# Patient Record
Sex: Male | Born: 1935 | Race: White | Hispanic: No | Marital: Married | Smoking: Former smoker
Health system: Southern US, Community
[De-identification: ages and names within clinical notes are randomized; demographics above are authoritative.]

## PROBLEM LIST (undated history)

## (undated) DIAGNOSIS — E876 Hypokalemia: Secondary | ICD-10-CM

## (undated) DIAGNOSIS — I10 Essential (primary) hypertension: Secondary | ICD-10-CM

## (undated) DIAGNOSIS — J690 Pneumonitis due to inhalation of food and vomit: Secondary | ICD-10-CM

## (undated) DIAGNOSIS — E785 Hyperlipidemia, unspecified: Secondary | ICD-10-CM

## (undated) DIAGNOSIS — D638 Anemia in other chronic diseases classified elsewhere: Secondary | ICD-10-CM

## (undated) DIAGNOSIS — C3490 Malignant neoplasm of unspecified part of unspecified bronchus or lung: Secondary | ICD-10-CM

## (undated) DIAGNOSIS — L080 Pyoderma: Secondary | ICD-10-CM

## (undated) DIAGNOSIS — R000 Tachycardia, unspecified: Secondary | ICD-10-CM

## (undated) DIAGNOSIS — R634 Abnormal weight loss: Secondary | ICD-10-CM

## (undated) DIAGNOSIS — B370 Candidal stomatitis: Secondary | ICD-10-CM

## (undated) DIAGNOSIS — L258 Unspecified contact dermatitis due to other agents: Secondary | ICD-10-CM

## (undated) DIAGNOSIS — D61818 Other pancytopenia: Secondary | ICD-10-CM

## (undated) DIAGNOSIS — G4700 Insomnia, unspecified: Secondary | ICD-10-CM

## (undated) DIAGNOSIS — R42 Dizziness and giddiness: Secondary | ICD-10-CM

## (undated) DIAGNOSIS — K208 Other esophagitis: Secondary | ICD-10-CM

## (undated) HISTORY — DX: Other esophagitis: K20.8

## (undated) HISTORY — DX: Candidal stomatitis: B37.0

## (undated) HISTORY — DX: Malignant neoplasm of unspecified part of unspecified bronchus or lung: C34.90

## (undated) HISTORY — DX: Unspecified contact dermatitis due to other agents: L25.8

## (undated) HISTORY — DX: Tachycardia, unspecified: R00.0

## (undated) HISTORY — DX: Other pancytopenia: D61.818

## (undated) HISTORY — DX: Anemia in other chronic diseases classified elsewhere: D63.8

## (undated) HISTORY — DX: Hyperlipidemia, unspecified: E78.5

## (undated) HISTORY — DX: Abnormal weight loss: R63.4

## (undated) HISTORY — DX: Pneumonitis due to inhalation of food and vomit: J69.0

## (undated) HISTORY — DX: Insomnia, unspecified: G47.00

## (undated) HISTORY — DX: Hypokalemia: E87.6

## (undated) HISTORY — PX: TONSILLECTOMY: SHX5217

## (undated) HISTORY — DX: Pyoderma: L08.0

## (undated) HISTORY — DX: Essential (primary) hypertension: I10

## (undated) HISTORY — PX: TONSILLECTOMY: SUR1361

---

## 2003-05-20 ENCOUNTER — Emergency Department (HOSPITAL_COMMUNITY): Admission: AD | Admit: 2003-05-20 | Discharge: 2003-05-20 | Payer: Self-pay | Admitting: Family Medicine

## 2005-12-13 ENCOUNTER — Emergency Department (HOSPITAL_COMMUNITY): Admission: EM | Admit: 2005-12-13 | Discharge: 2005-12-13 | Payer: Self-pay | Admitting: Family Medicine

## 2008-02-28 DIAGNOSIS — R42 Dizziness and giddiness: Secondary | ICD-10-CM

## 2008-02-28 HISTORY — DX: Dizziness and giddiness: R42

## 2008-08-14 ENCOUNTER — Inpatient Hospital Stay (HOSPITAL_COMMUNITY): Admission: EM | Admit: 2008-08-14 | Discharge: 2008-08-18 | Payer: Self-pay | Admitting: Emergency Medicine

## 2008-10-27 ENCOUNTER — Emergency Department (HOSPITAL_COMMUNITY): Admission: EM | Admit: 2008-10-27 | Discharge: 2008-10-27 | Payer: Self-pay | Admitting: Emergency Medicine

## 2010-06-04 LAB — COMPREHENSIVE METABOLIC PANEL
Albumin: 4.2 g/dL (ref 3.5–5.2)
Alkaline Phosphatase: 87 U/L (ref 39–117)
BUN: 16 mg/dL (ref 6–23)
Calcium: 9.3 mg/dL (ref 8.4–10.5)
Potassium: 4.4 mEq/L (ref 3.5–5.1)
Sodium: 138 mEq/L (ref 135–145)
Total Protein: 7.1 g/dL (ref 6.0–8.3)

## 2010-06-04 LAB — URINALYSIS, ROUTINE W REFLEX MICROSCOPIC
Leukocytes, UA: NEGATIVE
Protein, ur: 30 mg/dL — AB
Urobilinogen, UA: 0.2 mg/dL (ref 0.0–1.0)

## 2010-06-04 LAB — DIFFERENTIAL
Basophils Relative: 1 % (ref 0–1)
Lymphs Abs: 1 10*3/uL (ref 0.7–4.0)
Monocytes Absolute: 0.6 10*3/uL (ref 0.1–1.0)
Monocytes Relative: 6 % (ref 3–12)
Neutro Abs: 8.3 10*3/uL — ABNORMAL HIGH (ref 1.7–7.7)

## 2010-06-04 LAB — CBC
HCT: 38.6 % — ABNORMAL LOW (ref 39.0–52.0)
Platelets: 198 10*3/uL (ref 150–400)
RDW: 15 % (ref 11.5–15.5)

## 2010-06-04 LAB — URINE MICROSCOPIC-ADD ON

## 2010-06-06 LAB — BASIC METABOLIC PANEL
BUN: 22 mg/dL (ref 6–23)
BUN: 8 mg/dL (ref 6–23)
CO2: 27 mEq/L (ref 19–32)
Calcium: 8.2 mg/dL — ABNORMAL LOW (ref 8.4–10.5)
Calcium: 8.5 mg/dL (ref 8.4–10.5)
Chloride: 99 mEq/L (ref 96–112)
Creatinine, Ser: 1.18 mg/dL (ref 0.4–1.5)
Creatinine, Ser: 1.22 mg/dL (ref 0.4–1.5)
GFR calc Af Amer: >60 mL/min (ref >60)
GFR calc non Af Amer: >60 mL/min (ref >60)
Glucose, Bld: 136 mg/dL — ABNORMAL HIGH (ref 70–99)

## 2010-06-06 LAB — URINALYSIS, ROUTINE W REFLEX MICROSCOPIC
Glucose, UA: NEGATIVE mg/dL
Ketones, ur: NEGATIVE mg/dL
Nitrite: POSITIVE — AB
pH: 6.5 (ref 5.0–8.0)

## 2010-06-06 LAB — COMPREHENSIVE METABOLIC PANEL
ALT: 22 U/L (ref 0–53)
AST: 26 U/L (ref 0–37)
Albumin: 2.8 g/dL — ABNORMAL LOW (ref 3.5–5.2)
Alkaline Phosphatase: 66 U/L (ref 39–117)
BUN: 18 mg/dL (ref 6–23)
BUN: 21 mg/dL (ref 6–23)
CO2: 26 mEq/L (ref 19–32)
CO2: 28 mEq/L (ref 19–32)
Calcium: 8.1 mg/dL — ABNORMAL LOW (ref 8.4–10.5)
Calcium: 8.8 mg/dL (ref 8.4–10.5)
Chloride: 101 mEq/L (ref 96–112)
Chloride: 105 mEq/L (ref 96–112)
Creatinine, Ser: 1.25 mg/dL (ref 0.4–1.5)
Creatinine, Ser: 1.26 mg/dL (ref 0.4–1.5)
GFR calc non Af Amer: 56 mL/min — ABNORMAL LOW (ref >60)
GFR calc non Af Amer: >60 mL/min (ref >60)
Glucose, Bld: 102 mg/dL — ABNORMAL HIGH (ref 70–99)
Glucose, Bld: 108 mg/dL — ABNORMAL HIGH (ref 70–99)
Glucose, Bld: 169 mg/dL — ABNORMAL HIGH (ref 70–99)
Potassium: 4.2 mEq/L (ref 3.5–5.1)
Sodium: 134 mEq/L — ABNORMAL LOW (ref 135–145)
Sodium: 137 mEq/L (ref 135–145)
Total Bilirubin: 0.7 mg/dL (ref 0.3–1.2)
Total Bilirubin: 1.2 mg/dL (ref 0.3–1.2)
Total Protein: 6.7 g/dL (ref 6.0–8.3)

## 2010-06-06 LAB — CBC
HCT: 36.1 % — ABNORMAL LOW (ref 39.0–52.0)
HCT: 38.8 % — ABNORMAL LOW (ref 39.0–52.0)
Hemoglobin: 11.7 g/dL — ABNORMAL LOW (ref 13.0–17.0)
Hemoglobin: 12.3 g/dL — ABNORMAL LOW (ref 13.0–17.0)
Hemoglobin: 13.4 g/dL (ref 13.0–17.0)
MCHC: 34 g/dL (ref 30.0–36.0)
MCHC: 34.3 g/dL (ref 30.0–36.0)
MCHC: 34.4 g/dL (ref 30.0–36.0)
MCV: 84.8 fL (ref 78.0–100.0)
MCV: 85.8 fL (ref 78.0–100.0)
MCV: 86.1 fL (ref 78.0–100.0)
Platelets: 110 10*3/uL — ABNORMAL LOW (ref 150–400)
Platelets: 71 10*3/uL — ABNORMAL LOW (ref 150–400)
RBC: 4.01 MIL/uL — ABNORMAL LOW (ref 4.22–5.81)
RBC: 4.1 MIL/uL — ABNORMAL LOW (ref 4.22–5.81)
RBC: 4.22 MIL/uL (ref 4.22–5.81)
RDW: 13.6 % (ref 11.5–15.5)
RDW: 14.2 % (ref 11.5–15.5)
WBC: 7.4 10*3/uL (ref 4.0–10.5)
WBC: 8.1 10*3/uL (ref 4.0–10.5)
WBC: 8.3 10*3/uL (ref 4.0–10.5)

## 2010-06-06 LAB — URINE CULTURE: Colony Count: >=100000

## 2010-06-06 LAB — DIFFERENTIAL
Lymphocytes Relative: 3 % — ABNORMAL LOW (ref 12–46)
Lymphs Abs: 0.6 10*3/uL — ABNORMAL LOW (ref 0.7–4.0)
Monocytes Relative: 8 % (ref 3–12)
Neutro Abs: 20 10*3/uL — ABNORMAL HIGH (ref 1.7–7.7)
Neutrophils Relative %: 89 % — ABNORMAL HIGH (ref 43–77)

## 2010-06-06 LAB — LIPASE, BLOOD: Lipase: 12 U/L (ref 11–59)

## 2010-06-06 LAB — LACTIC ACID, PLASMA: Lactic Acid, Venous: 3.7 mmol/L — ABNORMAL HIGH (ref 0.5–2.2)

## 2010-06-06 LAB — CULTURE, BLOOD (ROUTINE X 2)

## 2010-06-06 LAB — CK TOTAL AND CKMB (NOT AT ARMC): Total CK: 173 U/L (ref 7–232)

## 2010-06-06 LAB — TROPONIN I: Troponin I: 0.03 ng/mL (ref 0.00–0.06)

## 2010-06-06 LAB — POCT CARDIAC MARKERS: Myoglobin, poc: >500 ng/mL (ref 12–200)

## 2010-06-06 LAB — URINE MICROSCOPIC-ADD ON

## 2010-07-12 NOTE — H&P (Signed)
NAME:  Justin Pittman, Justin Pittman NO.:  [id number]   MEDICAL RECORD NO.:  [id number]          PATIENT TYPE:  EMS   LOCATION:  ED                           FACILITY:  [facility]   PHYSICIAN:  Tera Mater. Evlyn Kanner, M.D. DATE OF BIRTH:  April 20, 1932   DATE OF ADMISSION:  08/14/2008  DATE OF DISCHARGE:                              HISTORY & PHYSICAL   HISTORY:  Justin Pittman is a 77 year old white male with a history of  hypertension, hyperlipidemia who presents for evaluation.  Yesterday, he  had some general malaise, but was doing relatively well.  He slept well  during the night, but awakened this morning with multiple episodes of  nausea and vomiting.  This was relatively projectile in nature and  happened several times.  His wife went in to find him in the bathroom.  He had laid himself down.  He had not fallen, but he was lying flat on  the floor with his head propped against the doorframe and was minimally  able to talk with her.  He has brought into the emergency room and found  to have a significant UTI and some hypotension.  He has already received  some fluids and some Cipro, and is doing a bit better.  He has had no  diarrhea.  His diet has been good recently.  He has had no chills,  sweats and no measured fever.  His general wellbeing has been good  recently.  He has had no unusual travels or exposures.  He has had no  change in his medications.  He has not been sick quite like this before.  He has a mild headache at the present.  He has a good memory of the  events.  There is no evidence of any loss of consciousness.  He is  having no cardiac symptoms.  Overall, this is fairly acute onset of  problems.   PAST MEDICAL HISTORY:  Includes hypertension and hyperlipidemia.  He has  been a prior smoker, but has not smoked in many years.  He has had BPH  in the past.  He has erectile dysfunction.   SOCIAL HISTORY:  He is married for 52 years.  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  He has not smoked since 1990s   FAMILY HISTORY:  Father died of a motor vehicle accident.  He was a  Visual merchandiser.  Mother died at 31 after hip replacement.  He has a brother that  had cancer of the bone plus pneumonia.  He has 3 children.  He is a  nondrinker.   CURRENT MEDICATIONS:  1. Lipitor 20 daily.  2. HCTZ 25 daily.   ALLERGIES:  1. ACE INHIBITOR which causes hypotension.  2. CRESTOR which causes constipation.   PHYSICAL EXAMINATION:  VITAL SIGNS:  Blood pressure initially was 107/53  with pulse 89, respirations 16, temperature 97.5, O2 sat 97%.  His  temperature did rise as high as 99.8, his O2 sat is still hovering about  94%.  GENERAL:  We have a thin, mildly ill-appearing white male lying quietly  on a stretcher in no real distress.  HEENT:  Sclerae anicteric.  Extraocular muscles are intact.  Oral mucous  membranes are relatively moist.  No oropharyngeal lesions are present.  NECK:  Supple without bruits or thyromegaly.  LUNGS:  Clear without wheezes, rales or rhonchi.  No accessory muscles  are used.  HEART:  Regular and distant.  I cannot appreciate any murmurs.  ABDOMEN:  Soft, nondistended, bit hypoactive in bowel sounds.  No  pulsations or masses are noted.  EXTREMITIES:  Reveals pulses to be relatively well preserved and no  edema.  NEURO:  The patient is awake, alert.  He answers my questions without  difficulty.  He is a bit sleepy.  No resting tremor is present.   LABORATORY DATA:  White count is 22,500, hemoglobin 13.4, platelets  169,000.  Cardiac markers show myoglobin point of care over 500.  Troponin is less than 0.05.  Chemistries reveal sodium 134, potassium  3.3, chloride 100, CO2 26, BUN 21, creatinine 1.26, glucose 169, total  bili is 1.2, alkaline phosphatase 117, AST is 35, ALT is 18, total  protein 6.7, albumin 3.8, calcium 8.8, serum lipase is 12, lactic acid  is elevated at 3.7.  Additional cardiac markers reveal CK of 173 with  MB  of 2.3 and relative index of 1.3.  Additional troponin was 0.03.  Urinalysis is yellow cloudy with pH of 6.5, small blood, positive  nitrite and trace leukocytes with 7-10 whites and many bacteria.   DIAGNOSTICS:  1. Abdominal x-ray which shows no acute finding, but moderately large      stool burden noted.  2. CT of the head was done with no acute intracranial abnormality,      chronic microvascular ischemic change and sinus disease noted in      the left frontal and scattered ethmoid air cells.   SUMMARY:  We have a 77 year old white male presenting with a fairly  acute onset of a febrile illness.  This appears to be UTI with impending  sepsis.  He is responding to fluids and is being covered with  ciprofloxacin and then this should do okay.  He also had some sinusitis.  I do not think there is any evidence any cerebritis or CNS spread.  The  Cipro should cover this.  He has some fluid deficits plus some low  potassium and sodium.  We will replace the potassium and give him some  normal saline.  He has some stress hyperglycemia.  This is a new problem  for him and we will be observing that with a fasting sugar in the  morning.  His lipid agents will be held.  I do not see any reason to put  him in the cardiac unit as his cardiac markers were all negative.  His  mild confusion was likely related to acute illness and seems to be  resolved.  I see no evidence for any CNS involvement.  The patient does  have a significant leukocytosis and we will make sure that comes down.  He will be admitted to a regular bed as an inpatient here at [facility].           ______________________________  Tera Mater. Evlyn Kanner, M.D.     SAS/MEDQ  D:  08/14/2008  T:  08/14/2008  Job:  [id number]

## 2010-07-12 NOTE — Discharge Summary (Signed)
NAME:  Justin Pittman, Justin Pittman NO.:  [id number]   MEDICAL RECORD NO.:  [id number]          PATIENT TYPE:  INP   LOCATION:  1316                         FACILITY:  [facility]   PHYSICIAN:  Larina Earthly, M.D.        DATE OF BIRTH:  03-08-1932   DATE OF ADMISSION:  08/14/2008  DATE OF DISCHARGE:  08/18/2008                               DISCHARGE SUMMARY   DISCHARGE DIAGNOSES:  1. Urinary tract infection associated with sepsis syndrome.  2. Hypertension.  3. Hypokalemia, resolved.  4. Hyperlipidemia.  5. Questionable hypoxia, now stable on room air.   DISCHARGE MEDICATIONS:  1. Hydrochlorothiazide 25 mg half a tablet p.o. daily to be restarted      on August 20, 2008.  2. Lipitor 20 mg daily.  3. Levaquin 500 mg daily for 7 days.   DISCHARGE INSTRUCTIONS:  The patient is to call Dr. Vicente Males office for an  appointment in approximately 2 weeks, at which time he will need his  blood pressure, potassium and urinalysis evaluated.  He is also further  instructed to call our office immediately if he has any shortness of  breath, fevers, chills, abdominal pain, weakness, nausea or vomiting.   PERTINENT LABORATORIES AND DISCHARGE LABORATORIES:  White blood cell  count 7.4 decreased from an admission value of 22.5, hemoglobin 11.8,  hematocrit 34.9%, platelet count 110.  Sodium 139, potassium 4.4, but as  well as 3.3 during hospitalization, BUN 8, creatinine 1.18, serum CO2 of  29, fasting blood glucose 95; however, on the second day of admission,  his fasting blood glucose was 136, AST 26, ALT 22, alkaline phosphatase  66, total bilirubin 0.7, albumin 2.6.  Cardiac enzyme markers negative  x1.  Calcium 8.5.  Blood cultures x2 negative.  Urine culture growing  out Klebsiella pneumonia sensitive to Cipro, Levaquin, gentamicin,  Rocephin and Septra.  Lactic acid on admission was 3.7 decreasing to 1.1  the second day of admission.  Acute abdominal and chest series revealed  no acute disease  but increased stool in the bowels.  Head CT revealed no  acute disease but some chronic small vessel disease and some sinus-  related inflammation.   Please note that the patient was maintained on subcutaneous Lovenox and  did get his Pneumovax during this hospitalization.   HISTORY OF PRESENT ILLNESS:  Please see the history and physical  dictated by Dr. Adrian Prince on August 14, 2008 for details.  However,  this is a 77 year old Caucasian male who has a history of hypertension  and hyperlipidemia who is quite active and performs all of his ADLs and  presented to the emergency room with multiple episodes of nausea and  vomiting, lethargy and significant weakness that was nonfocal.  In the  emergency room, he was found to have a significant UTI with hypotension  and was started on IV fluids and Cipro.  The patient was alert and  oriented throughout but lethargic and was able to answer questions.   HOSPITAL COURSE:  Despite IV fluids and Cipro, within the first 24 hours  of admission, the patient again spiked  a temperature to 104 degrees with  anorexia, but with decreasing white blood cell count and normal lactic  acid, given the continued fevers, the patient's antibiotics were  broadened from Cipro to Zosyn and vancomycin, after which the patient  felt better and fever curve improved significantly, with T max of only  100 on the second day of admission.  He was hemodynamically stable.  Given the fact that gram-negative rods were growing out of his urine  culture, vancomycin was discontinued on the second day of admission.  The day prior to discharge the patient was feeling well, his caloric  intake was satisfactory, yet he was still being maintained on high  levels of IV fluids and Zosyn.  The day before discharge, the patient's  IV fluids were discontinued.  He tolerated this well.  His oral intake  remained between 65-100% of his meals.  He was ambulatory around the  room, had no  further nausea, vomiting, shortness of breath or diarrhea  and felt generally better.  He was deemed appropriate for discharge on  August 18, 2008 based on results of urine culture ID and sensitivity  growing out Klebsiella pneumonia, and the fact that the patient may have  initially failed Cipro therapy despite antibiotic sensitivities.  The  patient will be discharged on Levaquin with close office follow up.  Please note that the patient's hospital course was also complicated by  some hypokalemia, presumably secondary to the significant nausea and  vomiting prior to admission.  This was repleted during his hospital  stay.  His hypertension remained well controlled despite the absence of  any thiazide diuretics, and his statin will be reinitiated upon  discharge.      Larina Earthly, M.D.  Electronically Signed     RA/MEDQ  D:  08/18/2008  T:  08/18/2008  Job:  [id number]

## 2011-07-11 ENCOUNTER — Encounter: Payer: Self-pay | Admitting: Internal Medicine

## 2011-07-11 ENCOUNTER — Ambulatory Visit (INDEPENDENT_AMBULATORY_CARE_PROVIDER_SITE_OTHER): Payer: Medicare Other | Admitting: Internal Medicine

## 2011-07-11 ENCOUNTER — Ambulatory Visit (INDEPENDENT_AMBULATORY_CARE_PROVIDER_SITE_OTHER)
Admission: RE | Admit: 2011-07-11 | Discharge: 2011-07-11 | Disposition: A | Discharge status: Home / Self Care | Payer: Medicare Other | Source: Ambulatory Visit | Attending: Internal Medicine | Admitting: Internal Medicine

## 2011-07-11 ENCOUNTER — Telehealth: Payer: Self-pay | Admitting: Internal Medicine

## 2011-07-11 VITALS — BP 122/82 | HR 72 | Temp 98.1°F | Ht 70.5 in | Wt 149.2 lb

## 2011-07-11 DIAGNOSIS — R05 Cough: Secondary | ICD-10-CM

## 2011-07-11 DIAGNOSIS — R918 Other nonspecific abnormal finding of lung field: Secondary | ICD-10-CM

## 2011-07-11 DIAGNOSIS — R222 Localized swelling, mass and lump, trunk: Secondary | ICD-10-CM

## 2011-07-11 DIAGNOSIS — R059 Cough, unspecified: Secondary | ICD-10-CM | POA: Insufficient documentation

## 2011-07-11 MED ORDER — PREDNISONE (PAK) 10 MG PO TABS: ORAL_TABLET | ORAL | Status: AC

## 2011-07-11 MED ORDER — NEBIVOLOL HCL 5 MG PO TABS: 5.0000 mg | ORAL_TABLET | Freq: Every day | ORAL | Status: DC

## 2011-07-11 NOTE — Progress Notes (Signed)
  Subjective:    Patient ID: Justin Pittman, male    DOB: 1932/03/21   MRN: [id number]  HPI  15 yowm quit smoking 1990 with no respiratory problems at all until onset cough since Jan 2012 self referred 07/11/2011  To pulmonary clinic   07/11/2011 1st pulmonary eval cc indolent onset persistent non productive daily cough x 1 year and 5 months most consistent at hs and sometimes wakes him up with it but present all during the day as well.  Becomes severe to point of gagging and choking assoc with  Hoarseness and some watery nasal drainage. No assoc sob unless having choking spell.     Also denies any obvious fluctuation of symptoms with weather or environmental changes or other aggravating or alleviating factors except as outlined above      Review of Systems  Constitutional: Negative for fever and unexpected weight change.  HENT: Negative for ear pain, nosebleeds, congestion, sore throat, rhinorrhea, sneezing, trouble swallowing, dental problem, postnasal drip and sinus pressure.   Eyes: Negative for redness and itching.  Respiratory: Positive for cough. Negative for chest tightness, shortness of breath and wheezing.   Cardiovascular: Negative for palpitations and leg swelling.  Gastrointestinal: Negative for nausea and vomiting.  Genitourinary: Negative for dysuria.  Musculoskeletal: Negative for joint swelling.  Skin: Negative for rash.  Neurological: Negative for headaches.  Hematological: Does not bruise/bleed easily.  Psychiatric/Behavioral: Negative for dysphoric mood. The patient is not nervous/anxious.        Objective:   Physical Exam  Wt 07/11/2011  149  amb wm classic voice fatigue, freq throat clearing  HEENT: edentulous,  Nl  turbinates, and orophanx. Nl external ear canals without cough reflex   NECK :  without JVD/Nodes/TM/ nl carotid upstrokes bilaterally   LUNGS: no acc muscle use, clear to A and P bilaterally without cough on insp or exp maneuvers   CV:   RRR  no s3 or murmur or increase in P2, no edema   ABD:  soft and nontender with nl excursion in the supine position. No bruits or organomegaly, bowel sounds nl  MS:  warm without deformities, calf tenderness, cyanosis or clubbing  SKIN: warm and dry without lesions    NEURO:  alert, approp, no deficits    CXR  07/11/2011 : Mass in the right perihilar region, likely the superior segment of the right lower lobe        Assessment & Plan:

## 2011-07-11 NOTE — Telephone Encounter (Signed)
Aware, see ov 

## 2011-07-11 NOTE — Patient Instructions (Signed)
Stop norvasc (amlodipine) and cozaar (losartan) and instead take bystolic 5 mg one daily  Try prilosec 20mg   Take 30-60 min before first meal of the day and Pepcid 20 mg one bedtime until you return  Prednisone 10 mg take  4 each am x 2 days,   2 each am x 2 days,  1 each am x2days and stop   GERD (REFLUX)  is an extremely common cause of respiratory symptoms, many times with no significant heartburn at all.    It can be treated with medication, but also with lifestyle changes including avoidance of late meals, excessive alcohol, smoking cessation, and avoid fatty foods, chocolate, peppermint, colas, red wine, and acidic juices such as orange juice.  NO MINT OR MENTHOL PRODUCTS SO NO COUGH DROPS  USE SUGARLESS CANDY INSTEAD (jolley ranchers or Stover's)  NO OIL BASED VITAMINS - use powdered substitutes.  Please remember to go to the x-ray department downstairs for your tests - we will call you with the results when they are available.     Please schedule a follow up office visit in 4 weeks, sooner if needed

## 2011-07-11 NOTE — Telephone Encounter (Signed)
Rhonda called from [facility] radiology with the following call report on pt cxr: IMPRESSION:  Mass in the right perihilar region, likely the superior segment of  the right lower lobe. Enhanced CT chest is recommended in further  evaluation.  I will forward to Dr. Sherene Sires.Carron Curie, CMA

## 2011-07-12 DIAGNOSIS — R918 Other nonspecific abnormal finding of lung field: Secondary | ICD-10-CM | POA: Insufficient documentation

## 2011-07-12 NOTE — Assessment & Plan Note (Signed)
Discussed in detail all the  indications, usual  risks and alternatives  relative to the benefits with patient  By phone p ov - the last cxr was 8 years prior to OV  And he thinks he had a pna in meantime but def needs f/u > regroup in office June 13 planned and placed reminder in tickle file

## 2011-08-04 ENCOUNTER — Telehealth: Payer: Self-pay | Admitting: *Deleted

## 2011-08-04 NOTE — Telephone Encounter (Signed)
Message copied by Christen Butter on Fri Aug 04, 2011  5:11 PM ------      Message from: Sandrea Hughs B      Created: Wed Jul 12, 2011  8:40 AM       Make sure ov  By now to f/u lung mass

## 2011-08-04 NOTE — Telephone Encounter (Signed)
Spoke with pt and scheduled ov with MW for 08-17-11 at 9 am

## 2011-08-17 ENCOUNTER — Ambulatory Visit (INDEPENDENT_AMBULATORY_CARE_PROVIDER_SITE_OTHER)
Admission: RE | Admit: 2011-08-17 | Discharge: 2011-08-17 | Disposition: A | Discharge status: Home / Self Care | Payer: Medicare Other | Source: Ambulatory Visit | Attending: Internal Medicine | Admitting: Internal Medicine

## 2011-08-17 ENCOUNTER — Ambulatory Visit (INDEPENDENT_AMBULATORY_CARE_PROVIDER_SITE_OTHER): Payer: Medicare Other | Admitting: Internal Medicine

## 2011-08-17 ENCOUNTER — Encounter: Payer: Self-pay | Admitting: Internal Medicine

## 2011-08-17 ENCOUNTER — Other Ambulatory Visit (INDEPENDENT_AMBULATORY_CARE_PROVIDER_SITE_OTHER): Payer: Medicare Other

## 2011-08-17 VITALS — BP 138/70 | HR 64 | Temp 97.7°F | Ht 70.0 in | Wt 145.0 lb

## 2011-08-17 DIAGNOSIS — R222 Localized swelling, mass and lump, trunk: Secondary | ICD-10-CM

## 2011-08-17 DIAGNOSIS — R05 Cough: Secondary | ICD-10-CM

## 2011-08-17 DIAGNOSIS — I10 Essential (primary) hypertension: Secondary | ICD-10-CM

## 2011-08-17 DIAGNOSIS — R918 Other nonspecific abnormal finding of lung field: Secondary | ICD-10-CM

## 2011-08-17 LAB — BASIC METABOLIC PANEL
CO2: 30 mEq/L (ref 19–32)
GFR: 64.87 mL/min (ref >60.00)
Glucose, Bld: 95 mg/dL (ref 70–99)
Potassium: 4.3 mEq/L (ref 3.5–5.1)
Sodium: 138 mEq/L (ref 135–145)

## 2011-08-17 LAB — CBC WITH DIFFERENTIAL/PLATELET
Basophils Relative: 0.9 % (ref 0.0–3.0)
Eosinophils Absolute: 0.3 10*3/uL (ref 0.0–0.7)
Eosinophils Relative: 3 % (ref 0.0–5.0)
Hemoglobin: 12.2 g/dL — ABNORMAL LOW (ref 13.0–17.0)
Lymphocytes Relative: 21.1 % (ref 12.0–46.0)
MCHC: 32.5 g/dL (ref 30.0–36.0)
MCV: 84.3 fl (ref 78.0–100.0)
Neutro Abs: 5.9 10*3/uL (ref 1.4–7.7)
Neutrophils Relative %: 63.9 % (ref 43.0–77.0)
RBC: 4.46 Mil/uL (ref 4.22–5.81)
WBC: 9.3 10*3/uL (ref 4.5–10.5)

## 2011-08-17 NOTE — Progress Notes (Signed)
Quick Note:  Spoke with pt and notified of results per Dr. Wert. Pt verbalized understanding and denied any questions.  ______ 

## 2011-08-17 NOTE — Assessment & Plan Note (Signed)
Resolved to his satisfaction by rx for GERD, continue

## 2011-08-17 NOTE — Progress Notes (Signed)
  Subjective:    Patient ID: Justin Pittman, male    DOB: 03/16/1932   MRN: [id number]  HPI  71 yowm quit smoking 1990 with no respiratory problems at all until onset cough since Jan 2012 self referred 07/11/2011  To pulmonary clinic   07/11/2011 1st pulmonary eval cc indolent onset persistent non productive daily cough x 1 year and 5 months most consistent at hs and sometimes wakes him up with it but present all during the day as well.  Becomes severe to point of gagging and choking assoc with  Hoarseness and some watery nasal drainage. No assoc sob unless having choking spell.  rec Stop norvasc (amlodipine) and cozaar (losartan) and instead take bystolic 5 mg one daily Try prilosec 20mg   Take 30-60 min before first meal of the day and Pepcid 20 mg one bedtime until you return Prednisone 10 mg take  4 each am x 2 days,   2 each am x 2 days,  1 each am x2days and stop  GERD diet    08/17/2011 f/u ov/Justin Pittman cc cough better to his satisfaction.  Cough is dry, no bloody or purulent mucus, no dental issus  Or dental work in last 3 months or risk fo asp. No limiting sob though relatively sedentary. ? Wt loss (pt not aware) no variability to symptoms.   Sleeping ok without nocturnal  or early am exacerbation  of respiratory  c/o's or need for noct saba. Also denies any obvious fluctuation of symptoms with weather or environmental changes or other aggravating or alleviating factors except as outlined above   ROS  At present neg for  any significant sore throat, dysphagia, dental problems, itching, sneezing,  nasal congestion or excess/ purulent secretions, ear ache,   fever, chills, sweats, unintended wt loss, pleuritic or exertional cp, hemoptysis, palpitations, orthopnea pnd or leg swelling.  Also denies presyncope, palpitations, heartburn, abdominal pain, anorexia, nausea, vomiting, diarrhea  or change in bowel or urinary habits, change in stools or urine, dysuria,hematuria,  rash, arthralgias, visual  complaints, headache, numbness weakness or ataxia or problems with walking or coordination. No noted change in mood/affect or memory.               Objective:   Physical Exam  Wt 07/11/2011  149 > 08/17/2011  145   amb wm min hoarseness   HEENT: edentulous,  Nl  turbinates, and orophanx. Nl external ear canals without cough reflex   NECK :  without JVD/Nodes/TM/ nl carotid upstrokes bilaterally   LUNGS: no acc muscle use, clear to A and P bilaterally without cough on insp or exp maneuvers   CV:  RRR  no s3 or murmur or increase in P2, no edema   ABD:  soft and nontender with nl excursion in the supine position. No bruits or organomegaly, bowel sounds nl  MS:  warm without deformities, calf tenderness, cyanosis or clubbing       CXR  08/17/2011 :  Stable rounded posterior right perihilar mass like density concerning for possible neoplasm.        Assessment & Plan:

## 2011-08-17 NOTE — Assessment & Plan Note (Addendum)
No baseline cxr avail, pos smoking hx so def risk lung ca - esr mod elevated ? BOOP? With nodular features   Discussed in detail all the  indications, usual  risks and alternatives  relative to the benefits with patient who agrees to proceed with ct scan then decide next step.

## 2011-08-17 NOTE — Patient Instructions (Addendum)
Continue the prilosec before bfast the pepcid at bedtime  Please see patient coordinator before you leave today  to schedule CT chest and sinus  Please remember to go to the lab  department downstairs for your tests - we will call you with the results when they are available.

## 2011-08-17 NOTE — Assessment & Plan Note (Signed)
Adequate control on present rx, reviewed  

## 2011-08-22 ENCOUNTER — Ambulatory Visit (INDEPENDENT_AMBULATORY_CARE_PROVIDER_SITE_OTHER)
Admission: RE | Admit: 2011-08-22 | Discharge: 2011-08-22 | Disposition: A | Discharge status: Home / Self Care | Payer: Medicare Other | Source: Ambulatory Visit | Attending: Internal Medicine | Admitting: Internal Medicine

## 2011-08-22 ENCOUNTER — Encounter: Payer: Self-pay | Admitting: Internal Medicine

## 2011-08-22 DIAGNOSIS — R222 Localized swelling, mass and lump, trunk: Secondary | ICD-10-CM

## 2011-08-22 DIAGNOSIS — R05 Cough: Secondary | ICD-10-CM

## 2011-08-22 DIAGNOSIS — R918 Other nonspecific abnormal finding of lung field: Secondary | ICD-10-CM

## 2011-08-22 MED ORDER — IOHEXOL 300 MG/ML  SOLN
80.0000 mL | Freq: Once | INTRAMUSCULAR | Status: AC | PRN
  Administered 2011-08-22: 80 mL via INTRAVENOUS

## 2011-08-23 ENCOUNTER — Telehealth: Payer: Self-pay | Admitting: Internal Medicine

## 2011-08-23 DIAGNOSIS — R918 Other nonspecific abnormal finding of lung field: Secondary | ICD-10-CM

## 2011-08-23 NOTE — Progress Notes (Signed)
Quick Note:  Spoke with pt's spouse and notified of results per Dr. Sherene Sires. See phone note dated 08/23/11 for further details. ______

## 2011-08-23 NOTE — Telephone Encounter (Signed)
Called the number given, and it was not in service. Called the home number and had to leave a msg tcb.

## 2011-08-23 NOTE — Telephone Encounter (Signed)
Spoke with pt's spouse and notified of results/recs of ct chest. She states that she will need to discuss this with the pt before we order a PET scan and wants to hear more details from MW on what the ct did show, she wonders if the mass is growing compared to last cxr. I advised will forward msg to MW to give her a call and she was okay with this. Thanks

## 2011-08-23 NOTE — Telephone Encounter (Signed)
Pt's wife returned call & can be reached at [phone].  Justin Pittman

## 2011-08-24 NOTE — Telephone Encounter (Signed)
Wife not available but talked to pt who agrees to proceed with PET preferably July 2 or3

## 2011-08-29 NOTE — Telephone Encounter (Signed)
Order for pet submitted

## 2011-08-29 NOTE — Telephone Encounter (Signed)
i do not have an order in epic for this pet scan and it will probably be next week

## 2011-08-29 NOTE — Addendum Note (Signed)
Addended by: Sandrea Hughs B on: 08/29/2011 03:58 PM   Modules accepted: Orders

## 2011-08-30 ENCOUNTER — Telehealth: Payer: Self-pay | Admitting: Internal Medicine

## 2011-08-30 NOTE — Telephone Encounter (Signed)
No answer 08/30/2011 1:15 pm

## 2011-08-30 NOTE — Telephone Encounter (Signed)
I spoke with spouse and she is requesting to speak with MW personally. She has a lot of questions for him. Please advise Dr. Sherene Sires, thanks

## 2011-09-05 ENCOUNTER — Encounter (HOSPITAL_COMMUNITY)
Admission: RE | Admit: 2011-09-05 | Discharge: 2011-09-05 | Disposition: A | Discharge status: Home / Self Care | Payer: Medicare Other | Source: Ambulatory Visit | Attending: Internal Medicine | Admitting: Internal Medicine

## 2011-09-05 ENCOUNTER — Other Ambulatory Visit (HOSPITAL_COMMUNITY): Payer: Medicare Other

## 2011-09-05 ENCOUNTER — Encounter (HOSPITAL_COMMUNITY): Payer: Self-pay

## 2011-09-05 DIAGNOSIS — C3490 Malignant neoplasm of unspecified part of unspecified bronchus or lung: Secondary | ICD-10-CM | POA: Insufficient documentation

## 2011-09-05 DIAGNOSIS — R222 Localized swelling, mass and lump, trunk: Secondary | ICD-10-CM | POA: Insufficient documentation

## 2011-09-05 DIAGNOSIS — R918 Other nonspecific abnormal finding of lung field: Secondary | ICD-10-CM

## 2011-09-05 MED ORDER — FLUDEOXYGLUCOSE F - 18 (FDG) INJECTION
18.1000 | Freq: Once | INTRAVENOUS | Status: AC | PRN
  Administered 2011-09-05: 18.1 via INTRAVENOUS

## 2011-09-07 NOTE — Progress Notes (Signed)
Quick Note:  Ov 09-12-11 at 10:45 am to discuss ______

## 2011-09-12 ENCOUNTER — Telehealth: Payer: Self-pay | Admitting: Internal Medicine

## 2011-09-12 ENCOUNTER — Encounter: Payer: Self-pay | Admitting: Internal Medicine

## 2011-09-12 ENCOUNTER — Ambulatory Visit (INDEPENDENT_AMBULATORY_CARE_PROVIDER_SITE_OTHER): Payer: Medicare Other | Admitting: Internal Medicine

## 2011-09-12 VITALS — BP 138/68 | HR 59 | Temp 97.6°F | Ht 70.0 in | Wt 144.4 lb

## 2011-09-12 DIAGNOSIS — R918 Other nonspecific abnormal finding of lung field: Secondary | ICD-10-CM

## 2011-09-12 DIAGNOSIS — R222 Localized swelling, mass and lump, trunk: Secondary | ICD-10-CM

## 2011-09-12 DIAGNOSIS — R05 Cough: Secondary | ICD-10-CM

## 2011-09-12 NOTE — Progress Notes (Signed)
  Subjective:    Patient ID: Justin Pittman, male    DOB: 1932-06-04   MRN: [id number]  HPI  26 yowm quit smoking 1990 with no respiratory problems at all until onset cough since Jan 2012 self referred 07/11/2011  To pulmonary clinic   07/11/2011 1st pulmonary eval cc indolent onset persistent non productive daily cough x 1 year and 5 months most consistent at hs and sometimes wakes him up with it but present all during the day as well.  Becomes severe to point of gagging and choking assoc with  Hoarseness and some watery nasal drainage. No assoc sob unless having choking spell.  rec Stop norvasc (amlodipine) and cozaar (losartan) and instead take bystolic 5 mg one daily Try prilosec 20mg   Take 30-60 min before first meal of the day and Pepcid 20 mg one bedtime until you return Prednisone 10 mg take  4 each am x 2 days,   2 each am x 2 days,  1 each am x2days and stop  GERD diet    08/17/2011 f/u ov/Adeana Grilliot cc cough better to his satisfaction.  Cough is dry, no bloody or purulent mucus, no dental issus  Or dental work in last 3 months or risk fo asp. No limiting sob though relatively sedentary. ? Wt loss (pt not aware) no variability to symptoms.  rec rx prilosec before bfast the pepcid at bedtime Please see patient coordinator before you leave today  to schedule CT chest and sinus Please remember to go to the lab  Department    09/12/2011 f/u ov/Amye Grego cc persistent cough but better than baseline, no longer using cough suppression. No hemoptysis or sob.  Sleeping ok without nocturnal  or early am exacerbation  of respiratory  c/o's or need for noct saba. Also denies any obvious fluctuation of symptoms with weather or environmental changes or other aggravating or alleviating factors except as outlined above   ROS  At present neg for  any significant sore throat, dysphagia, dental problems, itching, sneezing,  nasal congestion or excess/ purulent secretions, ear ache,   fever, chills, sweats,  unintended wt loss, pleuritic or exertional cp, hemoptysis, palpitations, orthopnea pnd or leg swelling.  Also denies presyncope, palpitations, heartburn, abdominal pain, anorexia, nausea, vomiting, diarrhea  or change in bowel or urinary habits, change in stools or urine, dysuria,hematuria,  rash, arthralgias, visual complaints, headache, numbness weakness or ataxia or problems with walking or coordination. No noted change in mood/affect or memory.            Objective:   Physical Exam  Wt 07/11/2011  149 > 08/17/2011  145 > 09/12/2011  144  amb wm min hoarseness   HEENT: edentulous,  Nl  turbinates, and orophanx. Nl external ear canals without cough reflex   NECK :  without JVD/Nodes/TM/ nl carotid upstrokes bilaterally   LUNGS: no acc muscle use, clear to A and P bilaterally without cough on insp or exp maneuvers   CV:  RRR  no s3 or murmur or increase in P2, no edema   ABD:  soft and nontender with nl excursion in the supine position. No bruits or organomegaly, bowel sounds nl  MS:  warm without deformities, calf tenderness, cyanosis or clubbing       CXR  08/17/2011 :  Stable rounded posterior right perihilar mass like density concerning for possible neoplasm.        Assessment & Plan:

## 2011-09-12 NOTE — Telephone Encounter (Signed)
Pt wanted clarification for the bronchoscopy and wanted to make sure of the instructions she and the pt were given at today's visit. Nothing further is needed.

## 2011-09-12 NOTE — Patient Instructions (Addendum)
Come in fasting (nothing to eat or drink after midnight) to Outpatient registration at [facility] at 730 am for flexible bronchoscopy on the 24th of July.

## 2011-09-16 NOTE — Assessment & Plan Note (Signed)
-   CT chest ordered 08/22/2011 > Lung mass with Pos nodes > PET POS 09/07/11 with small contralateral node also    - FOB 09/20/11 >>  I had an extended discussion with the patient and family today lasting 15 to 20 minutes of a 25 minute visit on the following issues:   Discussed in detail all the  indications, usual  risks and alternatives  relative to the benefits with patient who agrees to proceed with bronchoscopy with biopsy.  Agreed to proceed 7/24

## 2011-09-16 NOTE — Assessment & Plan Note (Signed)
-   Sinus CT 08/22/2011 > acute and chronic sinusitis  Cough has improved with gerd rx despite sinus ct findings so will hold off on augmentin rx

## 2011-09-19 ENCOUNTER — Telehealth: Payer: Self-pay | Admitting: Internal Medicine

## 2011-09-19 NOTE — Telephone Encounter (Signed)
Called, spoke with pt's spouse who states pt is scheduled from bronch tomorrow.  Resp called pt and advised him to take his bp med in the morning.  Wife states pt takes the chlorthalidone 25 mg 1/2 tablet qam and will take bystolic 5 mg in the am if bp is > 150/90.  States pt's BP is usually 116-120/70s in the mornings but has been as low as 90/60. They are concerned about pt taking these BP meds prior to procedure tomorrow as directed by Resp Therapy bc they do not want pt's BP to drop too low.  They are requesting Dr. Thurston Hole recs on this.  Pls advise.  Thank you.

## 2011-09-19 NOTE — Telephone Encounter (Signed)
Ok to take the bp meds the way they do at home usually anyway - if it's too low we can always give him some more fluid during the procedure but if it's too high it's hard to do the procedure at all

## 2011-09-19 NOTE — Telephone Encounter (Signed)
Pt is aware of MW recs. He voiced his understanding and needed nothing further

## 2011-09-20 ENCOUNTER — Ambulatory Visit (HOSPITAL_COMMUNITY)
Admission: RE | Admit: 2011-09-20 | Discharge: 2011-09-20 | Disposition: A | Discharge status: Home / Self Care | Payer: Medicare Other | Source: Ambulatory Visit | Attending: Internal Medicine | Admitting: Internal Medicine

## 2011-09-20 ENCOUNTER — Encounter (HOSPITAL_COMMUNITY)
Admission: RE | Disposition: A | Discharge status: Home / Self Care | Payer: Self-pay | Source: Ambulatory Visit | Attending: Internal Medicine

## 2011-09-20 ENCOUNTER — Encounter (HOSPITAL_COMMUNITY): Payer: Self-pay | Admitting: Internal Medicine

## 2011-09-20 ENCOUNTER — Telehealth: Payer: Self-pay | Admitting: Internal Medicine

## 2011-09-20 DIAGNOSIS — R918 Other nonspecific abnormal finding of lung field: Secondary | ICD-10-CM

## 2011-09-20 DIAGNOSIS — J8410 Pulmonary fibrosis, unspecified: Secondary | ICD-10-CM | POA: Insufficient documentation

## 2011-09-20 DIAGNOSIS — J984 Other disorders of lung: Secondary | ICD-10-CM | POA: Insufficient documentation

## 2011-09-20 DIAGNOSIS — R222 Localized swelling, mass and lump, trunk: Secondary | ICD-10-CM

## 2011-09-20 HISTORY — PX: VIDEO BRONCHOSCOPY: SHX5072

## 2011-09-20 SURGERY — VIDEO BRONCHOSCOPY WITHOUT FLUORO
Anesthesia: Moderate Sedation | Laterality: Bilateral

## 2011-09-20 MED ORDER — SODIUM CHLORIDE 0.9 % IV SOLN
INTRAVENOUS | Status: DC
  Administered 2011-09-20: 08:00:00 via INTRAVENOUS

## 2011-09-20 MED ORDER — MIDAZOLAM HCL 10 MG/2ML IJ SOLN
INTRAMUSCULAR | Status: DC | PRN
  Administered 2011-09-20: 2.5 mg via INTRAVENOUS

## 2011-09-20 MED ORDER — LIDOCAINE HCL 1 % IJ SOLN
INTRAMUSCULAR | Status: DC | PRN
  Administered 2011-09-20: 6 mL via RESPIRATORY_TRACT

## 2011-09-20 MED ORDER — MEPERIDINE HCL 100 MG/ML IJ SOLN
INTRAMUSCULAR | Status: AC
  Filled 2011-09-20: qty 2

## 2011-09-20 MED ORDER — LIDOCAINE HCL 2 % EX GEL: Freq: Once | CUTANEOUS | Status: DC

## 2011-09-20 MED ORDER — LIDOCAINE HCL 2 % EX GEL
CUTANEOUS | Status: DC | PRN
  Administered 2011-09-20: 1

## 2011-09-20 MED ORDER — MIDAZOLAM HCL 10 MG/2ML IJ SOLN
INTRAMUSCULAR | Status: AC
  Filled 2011-09-20: qty 4

## 2011-09-20 MED ORDER — MEPERIDINE HCL 25 MG/ML IJ SOLN
INTRAMUSCULAR | Status: DC | PRN
  Administered 2011-09-20 (×2): 25 mg via INTRAVENOUS

## 2011-09-20 MED ORDER — PHENYLEPHRINE HCL 0.25 % NA SOLN
1.0000 | Freq: Four times a day (QID) | NASAL | Status: DC | PRN
  Filled 2011-09-20: qty 15

## 2011-09-20 MED ORDER — PHENYLEPHRINE HCL 0.25 % NA SOLN
NASAL | Status: DC | PRN
  Administered 2011-09-20: 1

## 2011-09-20 NOTE — Progress Notes (Signed)
BP TAKEN AGAIN 185/77

## 2011-09-20 NOTE — Telephone Encounter (Signed)
Pt's spouse says the pt has fever of 100 and c/o chills. He has been covered up all afternoon with several blankets. She denied that the pt has any other symptoms, such as, sob, cough or pain. MW pls advise if okay for pt to take Tylenol and other things to look for.

## 2011-09-20 NOTE — Op Note (Signed)
Bronchoscopy Procedure Note  Date of Operation: 09/20/2011   Pre-op Diagnosis: Lung mass  Post-op Diagnosis: Lung Mass  Surgeon: Sandrea Hughs  Anesthesia: Monitored Local Anesthesia with Sedation  Operation: Video Flexible fiberoptic bronchoscopy, diagnostic   Findings: swelling starting in distal BI and almost completely obst RLL  Specimen: washings, wang bx, endobronchial bx  Estimated Blood Loss: Minimal  Complications: None  Indications and History: The patient is a 78 y.o. Male with lung mass. The risks, benefits, complications, treatment options and expected outcomes were discussed with the patient.  The possibilities of reaction to medication, pulmonary aspiration, perforation of a viscus, bleeding, failure to diagnose a condition and creating a complication requiring transfusion or operation were discussed with the patient who freely signed the consent.    Description of Procedure: The patient was re-examined in the bronchoscopy suite and the site of surgery properly noted/marked.  The patient was identified  and the procedure verified as Video Flexible Fiberoptic Bronchoscopy.  A Time Out was held and the above information confirmed.   After the induction of topical nasopharyngeal anesthesia, the patient was positioned  and the bronchoscope was passed through the Left naris. The vocal cords were visualized and  1% buffered lidocaine 5 ml was topically placed onto the cords. The cords werenl The scope was then passed into the trachea.  1% buffered lidocaine given topically. Airways inspected bilaterally to the subsegmental level with the following findings:  1) all airways nl x for  A) swelling starting in distal BI and almost completely obst RLL with RML patent  Procedures: Washings RLL Wang Bx RLL orifice Endobronch Bx RLL orifice     The Patient was taken to the Endoscopy Recovery area in satisfactory condition.  Attestation: I performed the procedure.  Sandrea Hughs, MD Pulmonary and Critical Care Medicine [facility] Cell [phone]

## 2011-09-20 NOTE — Telephone Encounter (Signed)
Per SN, have pt take Tylenol every 4 hrs as needed for the fever, drink plenty of fluids and follow all instructions given to him after the procedure. We will speak with MW in the morning to see if he has any other recs for the pt. Spouse verbalized understanding.

## 2011-09-20 NOTE — H&P (Signed)
HPI  71 yowm quit smoking 1990 with no respiratory problems at all until onset cough since Jan 2012 self referred 07/11/2011 To pulmonary clinic  07/11/2011 1st pulmonary eval cc indolent onset persistent non productive daily cough x 1 year and 5 months most consistent at hs and sometimes wakes him up with it but present all during the day as well. Becomes severe to point of gagging and choking assoc with Hoarseness and some watery nasal drainage. No assoc sob unless having choking spell.  rec  Stop norvasc (amlodipine) and cozaar (losartan) and instead take bystolic 5 mg one daily  Try prilosec 20mg  Take 30-60 min before first meal of the day and Pepcid 20 mg one bedtime until you return  Prednisone 10 mg take 4 each am x 2 days, 2 each am x 2 days, 1 each am x2days and stop  GERD diet  08/17/2011 f/u ov/Dunya Meiners cc cough better to his satisfaction. Cough is dry, no bloody or purulent mucus, no dental issus Or dental work in last 3 months or risk fo asp. No limiting sob though relatively sedentary. ? Wt loss (pt not aware) no variability to symptoms.  rec  rx prilosec before bfast the pepcid at bedtime  Please see patient coordinator before you leave today to schedule CT chest and sinus  Please remember to go to the lab Department  09/12/2011 f/u ov/Alphons Burgert cc persistent cough but better than baseline, no longer using cough suppression. No hemoptysis or sob.  Sleeping ok without nocturnal or early am exacerbation of respiratory c/o's or need for noct saba. Also denies any obvious fluctuation of symptoms with weather or environmental changes or other aggravating or alleviating factors except as outlined above  ROS At present neg for any significant sore throat, dysphagia, dental problems, itching, sneezing, nasal congestion or excess/ purulent secretions, ear ache, fever, chills, sweats, unintended wt loss, pleuritic or exertional cp, hemoptysis, palpitations, orthopnea pnd or leg swelling. Also denies  presyncope, palpitations, heartburn, abdominal pain, anorexia, nausea, vomiting, diarrhea or change in bowel or urinary habits, change in stools or urine, dysuria,hematuria, rash, arthralgias, visual complaints, headache, numbness weakness or ataxia or problems with walking or coordination. No noted change in mood/affect or memory.  Objective:   Physical Exam  Wt 07/11/2011 149 > 08/17/2011 145 > 09/12/2011 144  amb wm min hoarseness  HEENT: edentulous, Nl turbinates, and orophanx. Nl external ear canals without cough reflex  NECK : without JVD/Nodes/TM/ nl carotid upstrokes bilaterally  LUNGS: no acc muscle use, clear to A and P bilaterally without cough on insp or exp maneuvers  CV: RRR no s3 or murmur or increase in P2, no edema  ABD: soft and nontender with nl excursion in the supine position. No bruits or organomegaly, bowel sounds nl  MS: warm without deformities, calf tenderness, cyanosis or clubbing  CXR 08/17/2011 :  Stable rounded posterior right perihilar mass like density concerning for possible neoplasm.  Assessment & Plan:    Lung mass - Sandrea Hughs, MD 09/16/2011 5:58 AM Signed  - CT chest ordered 08/22/2011 > Lung mass with Pos nodes > PET POS 09/07/11 with small contralateral node also  - FOB 09/20/11 >>  I had an extended discussion with the patient and family today lasting 15 to 20 minutes of a 25 minute visit on the following issues:  Discussed in detail all the indications, usual risks and alternatives relative to the benefits with patient who agrees to proceed with bronchoscopy with biopsy. Agreed to proceed 7/24  09/20/2011 no change in hx or exam.  Off asa x one week  Sandrea Hughs, MD Pulmonary and Critical Care Medicine [facility] Cell [phone]

## 2011-09-20 NOTE — Telephone Encounter (Signed)
Agree with tylenol - he has no evidence of infection

## 2011-09-20 NOTE — Progress Notes (Signed)
Video Bronchoscopy done   Procedure tolerated well   Intervention Bronchial washings Intervention Bronchial Needle Aspiration Intervention Bronchlal Biopsy

## 2011-09-21 ENCOUNTER — Other Ambulatory Visit: Payer: Self-pay | Admitting: Internal Medicine

## 2011-09-21 ENCOUNTER — Encounter (HOSPITAL_COMMUNITY): Payer: Self-pay

## 2011-09-21 ENCOUNTER — Encounter (HOSPITAL_COMMUNITY): Payer: Self-pay | Admitting: Internal Medicine

## 2011-09-21 MED ORDER — AMOXICILLIN-POT CLAVULANATE 875-125 MG PO TABS: 1.0000 | ORAL_TABLET | Freq: Two times a day (BID) | ORAL | Status: AC

## 2011-09-21 NOTE — Telephone Encounter (Signed)
I spoke with the pt's spouse this morning and she says the pt is doing much better today. He slept well last night and his appetite seems to be returning. No fever. Will close msg but send to MW as an Financial planner.

## 2011-10-02 ENCOUNTER — Telehealth: Payer: Self-pay | Admitting: Internal Medicine

## 2011-10-02 DIAGNOSIS — R918 Other nonspecific abnormal finding of lung field: Secondary | ICD-10-CM

## 2011-10-02 DIAGNOSIS — R05 Cough: Secondary | ICD-10-CM

## 2011-10-02 NOTE — Telephone Encounter (Signed)
Yes, ok to overbook so we see him w/in a week of finishing his abx for cxr on return

## 2011-10-02 NOTE — Telephone Encounter (Signed)
I spoke with spouse and she states the pt was under the impression he was suppose to f/u with MW in 2 weeks with a CXR. i do not see where this is so. Please advise if pt needs appt Dr. Sherene Sires, thanks

## 2011-10-02 NOTE — Telephone Encounter (Signed)
I spoke with spouse and pt is scheduled to come in 10/06/11 at 11:30 w/ cxr prior

## 2011-10-06 ENCOUNTER — Ambulatory Visit (INDEPENDENT_AMBULATORY_CARE_PROVIDER_SITE_OTHER): Payer: Medicare Other | Admitting: Internal Medicine

## 2011-10-06 ENCOUNTER — Encounter: Payer: Self-pay | Admitting: Internal Medicine

## 2011-10-06 ENCOUNTER — Ambulatory Visit (INDEPENDENT_AMBULATORY_CARE_PROVIDER_SITE_OTHER)
Admission: RE | Admit: 2011-10-06 | Discharge: 2011-10-06 | Disposition: A | Discharge status: Home / Self Care | Payer: Medicare Other | Source: Ambulatory Visit | Attending: Internal Medicine | Admitting: Internal Medicine

## 2011-10-06 ENCOUNTER — Telehealth: Payer: Self-pay | Admitting: Internal Medicine

## 2011-10-06 VITALS — BP 122/78 | HR 52 | Temp 97.4°F | Ht 70.0 in | Wt 142.0 lb

## 2011-10-06 DIAGNOSIS — R918 Other nonspecific abnormal finding of lung field: Secondary | ICD-10-CM

## 2011-10-06 DIAGNOSIS — R05 Cough: Secondary | ICD-10-CM

## 2011-10-06 DIAGNOSIS — R222 Localized swelling, mass and lump, trunk: Secondary | ICD-10-CM

## 2011-10-06 MED ORDER — ACETAMINOPHEN-CODEINE #3 300-30 MG PO TABS: ORAL_TABLET | ORAL | Status: DC

## 2011-10-06 NOTE — Patient Instructions (Addendum)
Please see patient coordinator before you leave today  to schedule IR directed biopsy of R lung mass and I will call you with result  For cough take Tylenol #3 as needed

## 2011-10-06 NOTE — Progress Notes (Signed)
Subjective:    Patient ID: Justin Pittman, male    DOB: 08/08/1932   MRN: [id number]  HPI  51 yowm quit smoking 1990 with no respiratory problems at all until onset cough since Jan 2012 self referred 07/11/2011  To pulmonary clinic   07/11/2011 1st pulmonary eval cc indolent onset persistent non productive daily cough x 1 year and 5 months most consistent at hs and sometimes wakes him up with it but present all during the day as well.  Becomes severe to point of gagging and choking assoc with  Hoarseness and some watery nasal drainage. No assoc sob unless having choking spell.  rec Stop norvasc (amlodipine) and cozaar (losartan) and instead take bystolic 5 mg one daily Try prilosec 20mg   Take 30-60 min before first meal of the day and Pepcid 20 mg one bedtime until you return Prednisone 10 mg take  4 each am x 2 days,   2 each am x 2 days,  1 each am x2days and stop  GERD diet    08/17/2011 f/u ov/Nava Song cc cough better to his satisfaction.  Cough is dry, no bloody or purulent mucus, no dental issus  Or dental work in last 3 months or risk fo asp. No limiting sob though relatively sedentary. ? Wt loss (pt not aware) no variability to symptoms.  rec rx prilosec before bfast the pepcid at bedtime Please see patient coordinator before you leave today  to schedule CT chest and sinus Please remember to go to the lab  Department    09/12/2011 f/u ov/Ryiah Bellissimo cc persistent cough but better than baseline, no longer using cough suppression. No hemoptysis or sob. rec FOB 7/24 swollen BI, extrinsic compression Sup segment, bx, washings neg, fever post bronch ? Could this be an abscess > rx 2 weeks of augmentin then check cxr    10/06/2011 f/u ov/Kabella Cassidy cc persistent cough, malaise, wt loss continue s change p 2 weeks augmentin.  No   purulent sputum or sinus/hb symptoms on present rx. No unusual exp hx or risk of asp noted. Quite hoarse.  Sleeping ok without nocturnal  or early am exacerbation  of respiratory   c/o's or need for noct saba. Also denies any obvious fluctuation of symptoms with weather or environmental changes or other aggravating or alleviating factors except as outlined above   ROS  At present neg for  any significant sore throat, dysphagia, dental problems, itching, sneezing,  nasal congestion or excess/ purulent secretions, ear ache,   fever, chills, sweats, unintended wt loss, pleuritic or exertional cp, hemoptysis, palpitations, orthopnea pnd or leg swelling.  Also denies presyncope, palpitations, heartburn, abdominal pain, anorexia, nausea, vomiting, diarrhea  or change in bowel or urinary habits, change in stools or urine, dysuria,hematuria,  rash, arthralgias, visual complaints, headache, numbness weakness or ataxia or problems with walking or coordination. No noted change in mood/affect or memory.            Objective:   Physical Exam  Wt 07/11/2011  149 > 08/17/2011  145 > 09/12/2011  144 > 10/06/2011  142  amb wm moderate hoarseness, frail and older that stated age in appearance.  HEENT: edentulous,  Nl  turbinates, and orophanx. Nl external ear canals without cough reflex   NECK :  without JVD/Nodes/TM/ nl carotid upstrokes bilaterally   LUNGS: no acc muscle use, clear to A and P bilaterally without cough on insp or exp maneuvers   CV:  RRR  no s3 or murmur or increase in  P2, no edema   ABD:  soft and nontender with nl excursion in the supine position. No bruits or organomegaly, bowel sounds nl  MS:  warm without deformities, calf tenderness, cyanosis or clubbing       CXR  10/06/2011 :  Stable right perihilar mass.         Assessment & Plan:

## 2011-10-06 NOTE — Assessment & Plan Note (Addendum)
-   CT chest ordered 08/22/2011 > Lung mass with Pos nodes > PET POS 09/07/11 with small contralateral node also    - FOB 09/20/11 > neg cyt but poor access  I had an extended discussion with the patient and wife today lasting 15 to 20 minutes of a 25 minute visit on the following issues:   Discussed in detail all the  indications, usual  risks and alternatives  relative to the benefits with patient who agrees to proceed with Ct directed IR bx.

## 2011-10-06 NOTE — Telephone Encounter (Signed)
Called spoke with patient's wife who stated she thought MW told them that pt's tumor shrunk from abx therapy.  Would like to see if another abx will shrink it further as opposed to doing a needle biopsy.  Wife is also concerned about why pt needs to have another biopsy.  Worried this will cause pt's lungs to collapse.  Dr Sherene Sires please advise, thanks.  NOTE: pt's spouse is okay with a call back on Monday if MW has left the office.

## 2011-10-09 ENCOUNTER — Encounter (HOSPITAL_COMMUNITY): Payer: Self-pay | Admitting: Pharmacy Technician

## 2011-10-09 ENCOUNTER — Telehealth: Payer: Self-pay | Admitting: Internal Medicine

## 2011-10-09 ENCOUNTER — Other Ambulatory Visit: Payer: Self-pay | Admitting: Radiology

## 2011-10-09 NOTE — Telephone Encounter (Signed)
Discussed with pt and wife > proceed with bx

## 2011-10-09 NOTE — Telephone Encounter (Signed)
I spoke with pt and he wanted to know if he needed to take his BP medication prior to his procedure. He is only on bystolic 5 mg. i advised him his BP medication is the only med he takes before the procedure. He voiced his understanding, will forward to MW as an Financial planner

## 2011-10-09 NOTE — Telephone Encounter (Signed)
Noted by triage.  Biopsy scheduled for 8.14.13.  Will sign off.

## 2011-10-10 ENCOUNTER — Other Ambulatory Visit: Payer: Self-pay | Admitting: Radiology

## 2011-10-11 ENCOUNTER — Ambulatory Visit (HOSPITAL_COMMUNITY)
Admission: RE | Admit: 2011-10-11 | Discharge: 2011-10-11 | Disposition: A | Discharge status: Home / Self Care | Payer: Medicare Other | Source: Ambulatory Visit | Attending: Internal Medicine | Admitting: Internal Medicine

## 2011-10-11 ENCOUNTER — Encounter (HOSPITAL_COMMUNITY): Payer: Self-pay

## 2011-10-11 ENCOUNTER — Ambulatory Visit (HOSPITAL_COMMUNITY)
Admission: RE | Admit: 2011-10-11 | Discharge: 2011-10-11 | Disposition: A | Discharge status: Home / Self Care | Payer: Medicare Other | Source: Ambulatory Visit | Attending: Interventional Radiology | Admitting: Interventional Radiology

## 2011-10-11 DIAGNOSIS — Z7982 Long term (current) use of aspirin: Secondary | ICD-10-CM | POA: Insufficient documentation

## 2011-10-11 DIAGNOSIS — Z79899 Other long term (current) drug therapy: Secondary | ICD-10-CM | POA: Insufficient documentation

## 2011-10-11 DIAGNOSIS — I10 Essential (primary) hypertension: Secondary | ICD-10-CM | POA: Insufficient documentation

## 2011-10-11 DIAGNOSIS — R918 Other nonspecific abnormal finding of lung field: Secondary | ICD-10-CM

## 2011-10-11 DIAGNOSIS — Z87891 Personal history of nicotine dependence: Secondary | ICD-10-CM | POA: Insufficient documentation

## 2011-10-11 DIAGNOSIS — C3430 Malignant neoplasm of lower lobe, unspecified bronchus or lung: Secondary | ICD-10-CM | POA: Insufficient documentation

## 2011-10-11 LAB — CBC
HCT: 36 % — ABNORMAL LOW (ref 39.0–52.0)
Hemoglobin: 12.3 g/dL — ABNORMAL LOW (ref 13.0–17.0)
WBC: 8 10*3/uL (ref 4.0–10.5)

## 2011-10-11 LAB — APTT: aPTT: 32 seconds (ref 24–37)

## 2011-10-11 LAB — PROTIME-INR: INR: 1 (ref 0.00–1.49)

## 2011-10-11 MED ORDER — SODIUM CHLORIDE 0.9 % IV SOLN: Freq: Once | INTRAVENOUS | Status: DC

## 2011-10-11 MED ORDER — FENTANYL CITRATE 0.05 MG/ML IJ SOLN
INTRAMUSCULAR | Status: AC | PRN
  Administered 2011-10-11: 50 ug via INTRAVENOUS

## 2011-10-11 MED ORDER — MIDAZOLAM HCL 5 MG/5ML IJ SOLN
INTRAMUSCULAR | Status: AC | PRN
  Administered 2011-10-11: 1 mg via INTRAVENOUS

## 2011-10-11 MED ORDER — HYDROCODONE-ACETAMINOPHEN 5-325 MG PO TABS
1.0000 | ORAL_TABLET | ORAL | Status: DC | PRN
  Filled 2011-10-11: qty 2

## 2011-10-11 NOTE — H&P (Signed)
Chief Complaint: "I'm here for lung biopsy" Referring Physician:Wert HPI: Justin Pittman is an 78 y.o. male who is being worked up for a lung mass. He had bronch but not able to obtain ideal biopsy specimen. He is now referred to IR for CT guided perc biopsy. He otherwise feels ok, denies sxs.   Past Medical History:  Past Medical History  Diagnosis Date  . HTN (hypertension)   . Lung nodule     Past Surgical History:  Past Surgical History  Procedure Date  . Tonsillectomy   . Video bronchoscopy 09/20/2011    Procedure: VIDEO BRONCHOSCOPY WITHOUT FLUORO;  Surgeon: Nyoka Cowden, MD;  Location: [facility];  Service: Cardiopulmonary;  Laterality: Bilateral;    Family History:  Family History  Problem Relation Age of Onset  . Cancer Brother     Social History:  reports that he quit smoking about 23 years ago. His smoking use included Cigarettes. He has a 40 pack-year smoking history. He does not have any smokeless tobacco history on file. He reports that he does not drink alcohol or use illicit drugs.  Allergies: No Known Allergies  Medications: aspirin 81 MG tablet (Taking) Sig - Route: Take 81 mg by mouth daily. - Oral Class: Historical Med Number of times this order has been changed since signing: 1 Order [address] atorvastatin (LIPITOR) 20 MG tablet (Taking) Sig - Route: Take 20 mg by mouth daily with breakfast. - Oral Class: Historical Med Number of times this order has been changed since signing: 2 Order [address] chlorthalidone (HYGROTON) 25 MG tablet (Taking) 04/26/2011 Sig - Route: Take 0.5 tablets by mouth daily with breakfast. - Oral Class: Historical Med Number of times this order has been changed since signing: 2 Order [address] famotidine (PEPCID) 20 MG tablet (Taking) Sig - Route: Take 20 mg by mouth at bedtime. - Oral Class: Historical Med Number of times this order has been changed since signing: 1 Order [address] KLOR-CON M10 10 MEQ tablet (Taking) 06/08/2011  Sig - Route: Take 1 tablet by mouth Daily. - Oral Class: Historical Med Number of times this order has been changed since signing: 1 Order [address] nebivolol (BYSTOLIC) 5 MG tablet (Taking) 07/11/2011 Sig - Route: Take 5 mg by mouth daily with breakfast. - Oral Class: Historical Med Number of times this order has been changed since signing: 2 Order [address] omeprazole (PRILOSEC) 20 MG capsule (Taking) Sig - Route: Take 20 mg by mouth daily before breakfast. - Oral Class: Historical Med Number of times this order has been changed since signing: 1 Order [address] acetaminophen-codeine (TYLENOL #3) 300-30 MG per tablet 30 tablet 0 10/06/2011 10/05/2012 Sig: One every 4 hours as needed for cough Class: Print   Please HPI for pertinent positives, otherwise complete 10 system ROS negative.  Physical Exam: Blood pressure 146/73, pulse 60, temperature 97.4 F (36.3 C), temperature source Oral, resp. rate 18, height 5\' 10"  (1.778 m), weight 142 lb (64.411 kg), SpO2 97.00%. Body mass index is 20.37 kg/(m^2).   General Appearance:  Alert, cooperative, no distress, appears stated age  Head:  Normocephalic, without obvious abnormality, atraumatic  ENT: Unremarkable  Neck: Supple, symmetrical, trachea midline, no adenopathy, thyroid: not enlarged, symmetric, no tenderness/mass/nodules  Lungs:   Clear to auscultation bilaterally, no w/r/r, respirations unlabored without use of accessory muscles.  Chest Wall:  No tenderness or deformity  Heart:  Regular rate and rhythm, S1, S2 normal, no murmur, rub or gallop. Carotids 2+ without  bruit.  Abdomen:   Soft, non-tender, non distended. Bowel sounds active all four quadrants,  no masses, no organomegaly.  Neurologic: Normal affect, no gross deficits.   Results for orders placed during the hospital encounter of 10/11/11 (from the past 48 hour(s))  APTT     Status: Normal   Collection Time   10/11/11  7:45 AM      Component Value Range Comment   aPTT 32  24 - 37  seconds   CBC     Status: Abnormal   Collection Time   10/11/11  7:45 AM      Component Value Range Comment   WBC 8.0  4.0 - 10.5 K/uL    RBC 4.52  4.22 - 5.81 MIL/uL    Hemoglobin 12.3 (*) 13.0 - 17.0 g/dL    HCT 16.1 (*) 09.6 - 52.0 %    MCV 79.6  78.0 - 100.0 fL    MCH 27.2  26.0 - 34.0 pg    MCHC 34.2  30.0 - 36.0 g/dL    RDW 04.5  40.9 - 81.1 %    Platelets 267  150 - 400 K/uL   PROTIME-INR     Status: Normal   Collection Time   10/11/11  7:45 AM      Component Value Range Comment   Prothrombin Time 13.4  11.6 - 15.2 seconds    INR 1.00  0.00 - 1.49    No results found.  Assessment/Plan Right lower lung mass For CT guided bx. Discussed procedure including risks and potential complications with pt and son. Labs ok Consent signed in chart.  Brayton El PA-C 10/11/2011, 8:31 AM

## 2011-10-11 NOTE — Procedures (Signed)
Interventional Radiology Procedure Note  Procedure: CT guided biopsy of RLL pulmonary mass, FNA and 18G cores obtained. Complications:  No immediate Recommendations: - Flat, bedrest and no talking/starining x 2 hrs - Upright CXR at 2 hrs (noon) - If CXR clear, advance to ambulate as tolerated and DC at 13:00 if doing clinically well.   Signed,  Sterling Big, MD Vascular & Interventional Radiologist [facility]

## 2011-10-11 NOTE — Progress Notes (Signed)
Ambulated to BR unassisted but accompanied by staff.voided moderate amt urine and tolerated this well.States only small amt discomfort when he coughed but no shortness of breath and drainage on bandaid unchanged from previous

## 2011-10-11 NOTE — H&P (Signed)
Agree with above.  Will proceed with both FNA and real time cytology as well as core biopsy to ensure adequate sampling given prior non diagnostic biopsy.   Signed,  Sterling Big, MD Vascular & Interventional Radiologist [facility]

## 2011-10-12 ENCOUNTER — Encounter: Payer: Self-pay | Admitting: Internal Medicine

## 2011-10-12 ENCOUNTER — Other Ambulatory Visit: Payer: Self-pay | Admitting: Internal Medicine

## 2011-10-12 DIAGNOSIS — R918 Other nonspecific abnormal finding of lung field: Secondary | ICD-10-CM

## 2011-10-13 ENCOUNTER — Telehealth: Payer: Self-pay | Admitting: Internal Medicine

## 2011-10-13 NOTE — Telephone Encounter (Signed)
Returned wife's call and advised her that we have faxed over request, but appointment has not been scheduled as of yet. Advised wife that these appointment are usually on Thursday's. Advised wife that we should know something by Monday or Tuesday at the latest. Once we have appointment scheduled, we will contact them with appointment date, time and location.  Wife stated that pt has been told that he has carotid artery disease. Saw a physician a long time ago, was placed on medication, but medication was too strong. Wife stated that they didn't like physician and haven't been back. Now with all this going on, wife is concerned that he may need a cardiologist and would like Dr. Sherene Sires to refer. Please advise. Rhonda J Cobb

## 2011-10-13 NOTE — Telephone Encounter (Signed)
I spoke with Justin Pittman and she is wanting to know if oncology appt has been made yet or not. I advised her we have faxed referral over and we are still awaiting appt. She voiced her understanding but would like a call as soon as we know something. Will forward to [facility] so they are aware of this

## 2011-10-13 NOTE — Telephone Encounter (Signed)
Order placed. Jennifer Castillo, CMA  

## 2011-10-13 NOTE — Telephone Encounter (Signed)
Fine to refer to our cardiologist

## 2011-10-16 ENCOUNTER — Telehealth: Payer: Self-pay | Admitting: Internal Medicine

## 2011-10-16 ENCOUNTER — Telehealth: Payer: Self-pay | Admitting: *Deleted

## 2011-10-16 NOTE — Telephone Encounter (Signed)
Spoke with pt and wife regarding [facility] appt time and place 10/19/11 at 1:00.  They both verbalized understanding of appt time and place.

## 2011-10-16 NOTE — Telephone Encounter (Signed)
MW---pts wife is wanting to know the stage of lung cancer her husband has.  Please advise. thanks

## 2011-10-17 NOTE — Telephone Encounter (Signed)
Called, spoke with pt's wife.  I informed her Dr. Sherene Sires is not sure what stage this is and advised this is why he is sending pt to [facility], drs who specialize in cancer, -- to complete the work up for this and advise on recs for tx.  She then stated pt does have an appt with Dr. Shirline Frees but does not really feel comfortable with this appt.  She is requesting pt to see either Dr. Donnie Coffin or Dr. Doreatha Lew.  Also, has concerns because they were told pt would also see a Radiation Dr but they did not know who this would be yet.  She is concerned about the fact of not knowing which Dr. Rock Nephew will see.  She is requesting him see Dr. Roselind Messier as a Radiation Dr.  Algis Downs I would call [facility] and would call her back.  Called, spoke with Revonda Standard with [facility].  Was advised that Dr. Gwenyth Bouillon does specialize in lung cancer and is the only oncologist who comes to [facility].  Also, was advised the Radiation Drs who come to [facility] are Dr. Kathrynn Running, Dr. Mitzi Hansen, and Dr. Michell Heinrich.  These 3 drs rotate and [facility] does not know which dr will be there usually until Wednesdays.  Revonda Standard states Dr. Donnie Coffin, Dr. Doreatha Lew, and Dr. Roselind Messier are all in the cancer center.  If pt wishes to see these doctors only, will need to have a referral to cancer center - not [facility].  I called pt's wife back.  I explained to her the above from [place].  She verbalized understanding of this but is still very "nervous about all of this."  States she was seen at the cancer center in the past and it "was not organized."  States she felt very uncomfortable and transferred to another system which "was a blessing."  She is uncomfortable about pt seeing Mr. Mohammed and requesting him see Dr. Donnie Coffin or Dr. Doreatha Lew and Dr. Roselind Messier for Radiation.  Based on the above, she would like to know what Dr. Jules Schick would be best for pt.  Dr. Sherene Sires, pls advise.  Thank you.

## 2011-10-17 NOTE — Telephone Encounter (Signed)
Called spoke with pt's wife, advised of MW's recs as documented below.  Pt's wife stated that pt agrees with MW's plan to begin with [facility].  They will proceed with the upcoming appt.  Will sign and forward to MW as Lorain Childes.

## 2011-10-17 NOTE — Telephone Encounter (Signed)
I strongly prefer they go the [facility] route at least to start

## 2011-10-17 NOTE — Telephone Encounter (Signed)
Not sure but probably III a or b, that's why I'm sending him to [facility], to complete the w/u and recs for treatment

## 2011-10-17 NOTE — Telephone Encounter (Signed)
Patient's wife calling back. °

## 2011-10-19 ENCOUNTER — Ambulatory Visit
Admission: RE | Admit: 2011-10-19 | Discharge: 2011-10-19 | Disposition: A | Discharge status: Home / Self Care | Payer: Medicare Other | Source: Ambulatory Visit | Attending: Radiation Oncology | Admitting: Radiation Oncology

## 2011-10-19 ENCOUNTER — Encounter: Payer: Self-pay | Admitting: Internal Medicine

## 2011-10-19 ENCOUNTER — Encounter: Payer: Self-pay | Admitting: *Deleted

## 2011-10-19 ENCOUNTER — Encounter: Payer: Self-pay | Admitting: Radiation Oncology

## 2011-10-19 ENCOUNTER — Ambulatory Visit (HOSPITAL_BASED_OUTPATIENT_CLINIC_OR_DEPARTMENT_OTHER): Payer: Medicare Other | Admitting: Internal Medicine

## 2011-10-19 VITALS — BP 138/68 | HR 81 | Temp 97.8°F | Resp 16 | Ht 70.0 in | Wt 140.0 lb

## 2011-10-19 DIAGNOSIS — R599 Enlarged lymph nodes, unspecified: Secondary | ICD-10-CM

## 2011-10-19 DIAGNOSIS — C3490 Malignant neoplasm of unspecified part of unspecified bronchus or lung: Secondary | ICD-10-CM

## 2011-10-19 DIAGNOSIS — R918 Other nonspecific abnormal finding of lung field: Secondary | ICD-10-CM

## 2011-10-19 DIAGNOSIS — Z87891 Personal history of nicotine dependence: Secondary | ICD-10-CM

## 2011-10-19 DIAGNOSIS — C3430 Malignant neoplasm of lower lobe, unspecified bronchus or lung: Secondary | ICD-10-CM

## 2011-10-19 NOTE — Progress Notes (Addendum)
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 male who .  PREVIOUS RADIATION THERAPY: No  PAST MEDICAL HISTORY:  has a past medical history of HTN (hypertension) and Lung nodule.    PAST SURGICAL HISTORY: Past Surgical History  Procedure Date  . Tonsillectomy   . Video bronchoscopy 09/20/2011    Procedure: VIDEO BRONCHOSCOPY WITHOUT FLUORO;  Surgeon: Nyoka Cowden, MD;  Location: [facility];  Service: Cardiopulmonary;  Laterality: Bilateral;    FAMILY HISTORY: family history includes Cancer in his brother.  SOCIAL HISTORY:  reports that he quit smoking about 23 years ago. His smoking use included Cigarettes. He has a 40 pack-year smoking history. He has never used smokeless tobacco. He reports that he does not drink alcohol or use illicit drugs.  ALLERGIES: Review of patient's allergies indicates no known allergies.  MEDICATIONS:  Current Outpatient Prescriptions  Medication Sig Dispense Refill  . aspirin 81 MG tablet Take 81 mg by mouth daily.      Marland Kitchen atorvastatin (LIPITOR) 20 MG tablet Take 20 mg by mouth daily with breakfast.       . chlorthalidone (HYGROTON) 25 MG tablet Take 0.5 tablets by mouth daily with breakfast.       . famotidine (PEPCID) 20 MG tablet Take 20 mg by mouth at bedtime.      Marland Kitchen KLOR-CON M10 10 MEQ tablet Take 1 tablet by mouth Daily.      . nebivolol (BYSTOLIC) 5 MG tablet Take 5 mg by mouth daily with breakfast.       . omeprazole (PRILOSEC) 20 MG capsule Take 20 mg by mouth daily before breakfast.        REVIEW OF SYSTEMS:  A 15 point review of systems is  documented in the electronic medical record. This was obtained by the nursing staff. However, I reviewed this with the patient to discuss relevant findings and make appropriate changes.  A comprehensive review of systems was negative.   PHYSICAL EXAM:  vitals were not taken for this visit.  The patient is a thin elderly man in no acute distress. His respiratory effort is unremarkable. Further detailed physical exam is deferred today.  LABORATORY DATA:  Lab Results  Component Value Date   WBC 8.0 10/11/2011   HGB 12.3* 10/11/2011   HCT 36.0* 10/11/2011   MCV 79.6 10/11/2011   PLT 267 10/11/2011   Lab Results  Component Value Date   NA 138 08/17/2011   K 4.3 08/17/2011   CL 97 08/17/2011   CO2 30 08/17/2011   Lab Results  Component Value Date   ALT 20 10/27/2008   AST 20 10/27/2008   ALKPHOS 87 10/27/2008   BILITOT 0.9 10/27/2008     RADIOGRAPHY: Dg Chest 1 View  10/11/2011  *RADIOLOGY REPORT*  Clinical Data: Post right lung biopsy.  CHEST - 1 VIEW  Comparison: 10/06/2011  Findings: Right perihilar mass noted.  No pneumothorax following lung biopsy.  Mild hyperinflation.  Heart is normal size.  No acute infiltrates or effusions.  IMPRESSION: No pneumothorax following right lung biopsy.  Original Report Authenticated By: Cyndie Chime, M.D.  Dg Chest 2 View  10/06/2011  *RADIOLOGY REPORT*  Clinical Data: Follow-up nodule.  CHEST - 2 VIEW  Comparison: Chest x-ray 08/17/2011.  PET CT 09/05/2011.  Findings: The right perihilar mass like density again noted, unchanged.  Heart is normal size.  Left lung remains clear.  Mild hyperinflation of the lungs.  Biapical scarring.  No effusions.  IMPRESSION: Stable right perihilar mass.  COPD.  Original Report Authenticated By: Cyndie Chime, M.D.   Ct Biopsy  10/11/2011  *RADIOLOGY REPORT*  CT GUIDED LUNG BIOPSY  Date: 10/11/2011  Clinical History: 78 year old male with a mass in the superior segment of the right lower lobe.  He underwent endoscopic biopsy  with a nondiagnostic result and now presents for CT-guided biopsy to further facilitate tissue diagnosis.  Procedures Performed: 1. CT guided biopsy of mass in the superior segment of the right lower lobe  Interventional Radiologist:  Sterling Big, MD  Sedation: Moderate (conscious) sedation was used.  One mg Versed, 50 mcg Fentanyl were administered intravenously.  The patient's vital signs were monitored continuously by radiology nursing throughout the procedure.  Sedation Time: 30 minutes  PROCEDURE/FINDINGS:   Informed consent was obtained from the patient following explanation of the procedure, risks, benefits and alternatives. The patient understands, agrees and consents for the procedure. All questions were addressed. A time out was performed.  Maximal barrier sterile technique utilized including caps, mask, sterile gowns, sterile gloves, large sterile drape, hand hygiene, and betadine skin prep.  A planning axial CT scan was obtained.  The right infrahilar mass in the superior segment of the lower lobe was identified.  A suitable skin entry site was selected and marked.  Local anesthesia was achieved with infiltration of 1% lidocaine.  Using CT fluoroscopic guidance, a 17 gauge trocar needle was advanced through the skin, underlying soft tissues and pleura into the lung. The needle tip was then positioned just within the mass.  Given the history of a prior nondiagnostic biopsy, multiple fine needle aspirates were obtained and sent for on site cytopathologic evaluation.  Cytopathology confirmed adequate sampling.  Therefore, several 18-gauge core biopsies were obtained to facilitate further immunohistochemical and genetic testing.  The needle was removed.  A post biopsy axial CT scan was performed which demonstrated minimal hemorrhage around the mass, and no evidence of pneumothorax.  The patient tolerated the procedure well, there is no immediate complication.  After an appropriate observation time,  the patient was discharged home in stable condition.  IMPRESSION:  Technically successful CT guided fine needle aspiration and core biopsy of pulmonary mass in the superior segment of the right lower lobe.  Signed,  Sterling Big, MD Vascular & Interventional Radiologist [facility] Radiology  Original Report Authenticated By: Vilma Prader      IMPRESSION: Mr. Cranshaw is a very nice 78 year-old gentleman with locally advanced squamous cell carcinoma of the lung. He's not ideally suited for surgical resection. He appears to a good candidate for combined chemoradiotherapy, but will require close monitoring given his advanced age and may be at risk for nutritional deficiencies.   PLAN:  Today, talked to the patient and his wife about the findings and workup thus far. We talked about the management of stage III non-small cell lung cancer with combined chemoradiotherapy. We talked about the logistics and delivery of radiation as well as the anticipated acute and late sequelae. The patient is interested in proceeding with radiation as discussed and will present to our clinic tomorrow to proceed with CT  based treatment planning. I spent 60 minutes minutes face to face with the patient and more than 50% of that time was spent in counseling and/or coordination of care.   ------------------------------------------------  Artist Pais. Kathrynn Running, M.D.

## 2011-10-19 NOTE — Progress Notes (Signed)
[facility] [phone]   [phone]) [phone]  CONSULT NOTE  REASON FOR CONSULTATION:  78 years old white male diagnosed with lung cancer.  HPI Justin Pittman is a 78 y.o. male was past medical history significant for hypertension, UTI, dyslipidemia and dehydration as well as the remote history of smoking and he quit 20 years ago. The patient mentions that over the last 18 months he has been complaining of increasing cough and shortness of breath. He was seen by his primary care physician and treated with several courses of antibiotic with no improvement. Chest x-ray performed at that time was unremarkable. Chest x-ray performed on 08/17/2011 showed a stable rounded posterior right perihilar mass like density concerning for possible neoplasm. This was followed by CT scan of the chest with contrast on 08/23/2011 and it showed right infrahilar mass measuring 3.8 x 3.7 x 4.7 CM. There was low right paratracheal lymph nodes measuring 1.0 CM, subcarinal lymph node measuring 1.4 CM as well as left hilar lymph node measuring 1.1 CM. A PET scan on 09/07/2011 showed the large right perihilar mass with SUV max equal to 27.4 and mild increased FDG uptake associated with the left hilar region with SUV max of 5.7 but no significant FDG uptake was associated with a low right paratracheal or subcarinal lymph nodes. On 10/11/2011 the patient underwent CT-guided fine needle aspiration and core biopsy of the pulmonary mass in the superior segment of the right lower lobe. The final pathology was consistent with non-small cell carcinoma. Immunostains were performed and the tumor cells show the following immunoprofile:p63 positive, CK5/6 positive, CK7 positive, TTF-1 negative, Napsin A negative. The overall findings are diagnostic for squamous cell carcinoma. The patient was referred to me today for evaluation and recommendation regarding treatment of his recently diagnosed non-small cell lung  cancer. He continues to complain of dry cough, but no chest pain or shortness of breath, no hemoptysis. He lost around 15 pounds in the last 4 months secondary to poor appetite. The patient denied having any visual changes or headache. His family history significant for brother with a history of bone cancer. Mother died from old age at age 31 and father killed in an accident at age 18. The patient is married and has 3 children. He was accompanied today by his wife Justin Pittman. He works in Sport and exercise psychologist. He has a history of smoking one pack per day for around 40 years quit 20 years ago, no history of alcohol or drug abuse.   @SFHPI @  Past Medical History  Diagnosis Date  . HTN (hypertension)   . Lung nodule     Past Surgical History  Procedure Date  . Tonsillectomy   . Video bronchoscopy 09/20/2011    Procedure: VIDEO BRONCHOSCOPY WITHOUT FLUORO;  Surgeon: Nyoka Cowden, MD;  Location: [facility];  Service: Cardiopulmonary;  Laterality: Bilateral;    Family History  Problem Relation Age of Onset  . Cancer Brother     Social History History  Substance Use Topics  . Smoking status: Former Smoker -- 1.0 packs/day for 40 years    Types: Cigarettes    Quit date: 02/28/1988  . Smokeless tobacco: Never Used  . Alcohol Use: No    No Known Allergies  Current Outpatient Prescriptions  Medication Sig Dispense Refill  . aspirin 81 MG tablet Take 81 mg by mouth daily.      . chlorthalidone (HYGROTON) 25 MG tablet Take 0.5 tablets by mouth daily with breakfast.       .  famotidine (PEPCID) 20 MG tablet Take 20 mg by mouth at bedtime.      Marland Kitchen KLOR-CON M10 10 MEQ tablet Take 1 tablet by mouth Daily.      . nebivolol (BYSTOLIC) 5 MG tablet Take 5 mg by mouth daily with breakfast.       . omeprazole (PRILOSEC) 20 MG capsule Take 20 mg by mouth daily before breakfast.      . atorvastatin (LIPITOR) 20 MG tablet Take 20 mg by mouth daily with breakfast.         Review of  Systems  A comprehensive review of systems was negative except for: Constitutional: positive for anorexia and weight loss Respiratory: positive for cough  Physical Exam  ZOX:WRUEA, healthy, no distress, well nourished and well developed SKIN: skin color, texture, turgor are normal HEAD: Normocephalic EYES: normal EARS: External ears normal OROPHARYNX:no exudate and no erythema  NECK: supple, no adenopathy LYMPH:  no palpable lymphadenopathy, no hepatosplenomegaly LUNGS: clear to auscultation  HEART: regular rate & rhythm and no murmurs ABDOMEN:abdomen soft and non-tender BACK: Back symmetric, no curvature. EXTREMITIES:no joint deformities, effusion, or inflammation, no edema, no skin discoloration, no clubbing, no cyanosis  NEURO: alert & oriented x 3 with fluent speech, no focal motor/sensory deficits  PERFORMANCE STATUS: ECOG 1  LABORATORY DATA: Lab Results  Component Value Date   WBC 8.0 10/11/2011   HGB 12.3* 10/11/2011   HCT 36.0* 10/11/2011   MCV 79.6 10/11/2011   PLT 267 10/11/2011      Chemistry      Component Value Date/Time   NA 138 08/17/2011 0936   K 4.3 08/17/2011 0936   CL 97 08/17/2011 0936   CO2 30 08/17/2011 0936   BUN 21 08/17/2011 0936   CREATININE 1.2 08/17/2011 0936      Component Value Date/Time   CALCIUM 9.5 08/17/2011 0936   ALKPHOS 87 10/27/2008 2016   AST 20 10/27/2008 2016   ALT 20 10/27/2008 2016   BILITOT 0.9 10/27/2008 2016       RADIOGRAPHIC STUDIES: Dg Chest 1 View  10/11/2011  *RADIOLOGY REPORT*  Clinical Data: Post right lung biopsy.  CHEST - 1 VIEW  Comparison: 10/06/2011  Findings: Right perihilar mass noted.  No pneumothorax following lung biopsy.  Mild hyperinflation.  Heart is normal size.  No acute infiltrates or effusions.  IMPRESSION: No pneumothorax following right lung biopsy.  Original Report Authenticated By: Cyndie Chime, M.D.   Dg Chest 2 View  10/06/2011  *RADIOLOGY REPORT*  Clinical Data: Follow-up nodule.  CHEST - 2 VIEW   Comparison: Chest x-ray 08/17/2011.  PET CT 09/05/2011.  Findings: The right perihilar mass like density again noted, unchanged.  Heart is normal size.  Left lung remains clear.  Mild hyperinflation of the lungs.  Biapical scarring.  No effusions.  IMPRESSION: Stable right perihilar mass.  COPD.  Original Report Authenticated By: Cyndie Chime, M.D.   Ct Biopsy  10/11/2011  *RADIOLOGY REPORT*  CT GUIDED LUNG BIOPSY  Date: 10/11/2011  Clinical History: 78 year old male with a mass in the superior segment of the right lower lobe.  He underwent endoscopic biopsy with a nondiagnostic result and now presents for CT-guided biopsy to further facilitate tissue diagnosis.  Procedures Performed: 1. CT guided biopsy of mass in the superior segment of the right lower lobe  Interventional Radiologist:  Sterling Big, MD  Sedation: Moderate (conscious) sedation was used.  One mg Versed, 50 mcg Fentanyl were administered intravenously.  The patient's vital  signs were monitored continuously by radiology nursing throughout the procedure.  Sedation Time: 30 minutes  PROCEDURE/FINDINGS:   Informed consent was obtained from the patient following explanation of the procedure, risks, benefits and alternatives. The patient understands, agrees and consents for the procedure. All questions were addressed. A time out was performed.  Maximal barrier sterile technique utilized including caps, mask, sterile gowns, sterile gloves, large sterile drape, hand hygiene, and betadine skin prep.  A planning axial CT scan was obtained.  The right infrahilar mass in the superior segment of the lower lobe was identified.  A suitable skin entry site was selected and marked.  Local anesthesia was achieved with infiltration of 1% lidocaine.  Using CT fluoroscopic guidance, a 17 gauge trocar needle was advanced through the skin, underlying soft tissues and pleura into the lung. The needle tip was then positioned just within the mass.  Given the  history of a prior nondiagnostic biopsy, multiple fine needle aspirates were obtained and sent for on site cytopathologic evaluation.  Cytopathology confirmed adequate sampling.  Therefore, several 18-gauge core biopsies were obtained to facilitate further immunohistochemical and genetic testing.  The needle was removed.  A post biopsy axial CT scan was performed which demonstrated minimal hemorrhage around the mass, and no evidence of pneumothorax.  The patient tolerated the procedure well, there is no immediate complication.  After an appropriate observation time, the patient was discharged home in stable condition.  IMPRESSION:  Technically successful CT guided fine needle aspiration and core biopsy of pulmonary mass in the superior segment of the right lower lobe.  Signed,  Sterling Big, MD Vascular & Interventional Radiologist [facility] Radiology  Original Report Authenticated By: Vilma Prader    ASSESSMENT: This is a very pleasant 78 years old white male recently diagnosed with a stage IIIB non-small cell lung cancer, squamous cell carcinoma with a right lower lobe lung mass as well as left hilar lymphadenopathy.  PLAN: I have a lengthy discussion with the patient and his wife today about his disease stage, prognosis and treatment options. I recommended for the patient treatment with a course of concurrent chemoradiation with weekly carboplatin for AUC of and paclitaxel 45 mg/M2 for a total of 6-7 weeks followed by restaging scan and probably more consolidation chemotherapy. I discussed with the patient adverse effect of this treatment including but not limited to alopecia, myelosuppression, nausea vomiting, peripheral neuropathy, liver or renal dysfunction. I will complete the staging workup I ordered an MRI of the brain to rule out brain metastasis. The patient was seen by Dr. Kathrynn Running today for discussion of the concurrent radiotherapy option. I expect the patient to start the first cycle of his  treatment in less than 2 weeks. He would have a chemoeducation class before the first cycle of his chemotherapy. The patient would come back for followup visit in 3 weeks for evaluation and management any adverse effect of his treatment. I will call his pharmacy with a prescription for Compazine 10 mg by mouth every 6 hours as needed for nausea.  He was advised to call me immediately if he has any concerning symptoms in the interval. I gave the patient and his wife the time to ask questions and answers and completed to their satisfaction.  All questions were answered. The patient knows to call the clinic with any problems, questions or concerns. We can certainly see the patient much sooner if necessary.  Thank you so much for allowing me to participate in the care of Justin Pittman.  I will continue to follow up the patient with you and assist in his care.  I spent 30 minutes counseling the patient face to face. The total time spent in the appointment was 60 minutes.  Justin Pittman K. 10/19/2011, 2:49 PM

## 2011-10-19 NOTE — Patient Instructions (Addendum)
Lung Cancer  Lung cancer is a tumor which starts as a growth in your lungs. Cancer is a group of many related diseases that begin in cells, the building blocks of the body. Normally, cells grow and divide to produce more cells only when the body needs them. Sometimes cells keep dividing when new cells are not needed. These extra cells may form a mass of tissue called a growth or tumor. Tumors can be either benign (not cancerous) or malignant (cancerous). Cancer can begin in any organ or tissue of the body. The original tumor (where the tumor started out) is called the primary cancer and is usually named for where it begins.   Lung cancer is the most common cause of cancer death in men and women. There are several different types of lung cancers. Usually, lung cancer is described as either small-cell lung cancer or non-small-cell lung cancer. Other types of cancer occur in the lungs, including carcinoid and cancers spread from other organs. The types of cancer have different behavior and treatment.  CAUSES   This cancer usually starts when the lungs are exposed to harmful chemicals. When you quit smoking, your risk of lung cancer falls each year (but is never the same as a person who has never smoked).   Other risks include:    Radon gas exposure.   Asbestos and other industrial substance exposure.   Second hand tobacco smoke.   Air pollution.   Family or personal history of lung cancer.   Age over 65.  SYMPTOMS   Lung cancer can cause many symptoms. They depend on the type of cancer, its location and other factors.  Symptoms of lung cancer can include:   Cough (either new, different or more severe).   Shortness of breath.   Coughing up blood (hemoptysis).   Chest pain.   Hoarseness.   Swelling of the face.   Drooping eyelid.   Changes in blood tests: low sodium (hyponatremia), high calcium (hypercalcemia) or low blood count (anemia).   Weight loss.  In its early stages, lung cancer may not have  symptoms and can be discovered by accident. Many of the symptoms above can be caused by diseases other than lung cancer.  DIAGNOSIS   In early lung cancer, the patient often does not notice problems. It usually has spread by the time problems are first noticed. Your caregiver may suspect lung cancer based on your symptoms, your exam or based on tests (such as x-rays) obtained for other reasons. Common tests that help your caregiver diagnose your condition include:   Chest x-ray.   CT scan of the lungs and chest.   Blood tests.  If a tumor is found, a biopsy will be necessary to confirm that cancer is present and to determine the type of cancer.  TREATMENT    Surgery offers a hope for a cure if the cancer has not spread and the cancer is not a small cell (oat cell) cancer of the lung. Surgery cannot cure the small cell type of cancer.   Radiation Therapy is a form of high energy X-ray that helps slow or kill the cancer. It is often used along with medications (chemotherapy) to help treat the cancer and control pain.   Chemotherapy is used in combination with surgery in advanced cancer. It is also used in all small cell cancers.   Many new treatments look promising.   Your caregiver can give you more information and discuss treatment options that are best for   your type of cancer.  HOME CARE INSTRUCTIONS    If you smoke, stop!   Take all medications as told.   Keep all appointments with your caregiver and other specialists.   Ask your caregiver if you should see a cancer specialist, if that has not been arranged.   If you require oxygen or breathing equipment, be sure you know how to use it and who to call with questions.   Follow any special diet directions. If you have problems with appetite, ask your caregiver for help.  SEEK MEDICAL CARE IF:    You have had a surgical procedure are you are having trouble recovering.   You have ongoing weight loss.   You have decreased strength or energy past the  point when your caregiver said you would feel better.   You develop nausea or lightheadedness.   You have pain that is not improving.  SEEK IMMEDIATE MEDICAL CARE IF:    You cough up clotted blood or bright red blood.   Your pain is uncontrolled.   You develop new difficulty breathing or chest pain.   You develop swelling in one or both ankles or legs, or swelling in your face or neck.   You develop new headache or confusion.  Document Released: 05/22/2000 Document Revised: 02/02/2011 Document Reviewed: 03/02/2008  ExitCare Patient Information 2012 [employer].

## 2011-10-19 NOTE — Progress Notes (Signed)
Spoke with pt and wife at [facility] today.  Educational/resource information given and explained.  Dietary and distress screening completed.  Questions and concerns addressed.

## 2011-10-20 ENCOUNTER — Encounter: Payer: Self-pay | Admitting: Radiation Oncology

## 2011-10-20 ENCOUNTER — Ambulatory Visit
Admission: RE | Admit: 2011-10-20 | Discharge: 2011-10-20 | Disposition: A | Discharge status: Home / Self Care | Payer: Medicare Other | Source: Ambulatory Visit | Attending: Radiation Oncology | Admitting: Radiation Oncology

## 2011-10-20 ENCOUNTER — Telehealth: Payer: Self-pay | Admitting: Internal Medicine

## 2011-10-20 VITALS — BP 175/69 | HR 59 | Temp 97.7°F

## 2011-10-20 DIAGNOSIS — R233 Spontaneous ecchymoses: Secondary | ICD-10-CM | POA: Insufficient documentation

## 2011-10-20 DIAGNOSIS — C3490 Malignant neoplasm of unspecified part of unspecified bronchus or lung: Secondary | ICD-10-CM

## 2011-10-20 DIAGNOSIS — Z510 Encounter for antineoplastic radiation therapy: Secondary | ICD-10-CM | POA: Insufficient documentation

## 2011-10-20 DIAGNOSIS — R059 Cough, unspecified: Secondary | ICD-10-CM | POA: Insufficient documentation

## 2011-10-20 DIAGNOSIS — R05 Cough: Secondary | ICD-10-CM | POA: Insufficient documentation

## 2011-10-20 DIAGNOSIS — K5900 Constipation, unspecified: Secondary | ICD-10-CM | POA: Insufficient documentation

## 2011-10-20 HISTORY — DX: Dizziness and giddiness: R42

## 2011-10-20 NOTE — Addendum Note (Signed)
Encounter addended by: Oneita Hurt, MD on: 10/20/2011 12:27 PM<BR>     Documentation filed: Notes Section

## 2011-10-20 NOTE — Telephone Encounter (Signed)
Spoke with pt's spouse and notified of recs per MW She verbalized understanding and states nothing further needed 

## 2011-10-20 NOTE — Progress Notes (Signed)
  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  Identity was confirmed.  All relevant records and images related to the planned course of therapy were reviewed.  The patient freely provided informed written consent to proceed with treatment after reviewing the details related to the planned course of therapy. The consent form was witnessed and verified by the simulation staff.  Then, the patient was set-up in a stable reproducible  supine position for radiation therapy.  CT images were obtained.  Surface markings were placed.  The CT images were loaded into the planning software.  Then the target and avoidance structures were contoured.  Treatment planning then occurred.  The radiation prescription was entered and confirmed.  A total of one complex treatment device was fabricated in the form of a VAC LOC pillow head holder with arm position her on the wing board.. I have requested : Intensity Modulated Radiotherapy (IMRT) is medically necessary for this case for the following reason:  The patient's tumor involvement of the chest involves bilateral mediastinal hila which will require the full dose of radiation. This would not be possible to deliver using conventional radiation beam angles without exceeding spinal cord tolerance or tolerance of the whole lung. Because of this, intensity modulated radiotherapy is medically required..  I have ordered:Nutrition Consult  PLAN:  The patient will receive 66 Gy in 33 fraction.  ________________________________  Artist Pais Kathrynn Running, M.D.

## 2011-10-20 NOTE — Progress Notes (Signed)
Met with patient to discuss RO billing. Pt expressed some financial concerns, but advised he will call if need be.  Dx: 162.3 Upper lobe, lung   Attending Rad: Dr. Kathrynn Running  Rad Tx: IMRT  [id number]

## 2011-10-20 NOTE — Telephone Encounter (Signed)
MW---pts wife is wanting to know if the pt should have carotid artery checked before her starts radiation or chemo.  Please advise. thanks

## 2011-10-20 NOTE — Telephone Encounter (Signed)
No need for this from my perspective but should discuss issues like this with the treating doctors

## 2011-10-20 NOTE — Telephone Encounter (Signed)
[email or web address].Justin Pittman, CMA  

## 2011-10-20 NOTE — Telephone Encounter (Signed)
Spouse Marylu Lund returned call. Hazel Sams

## 2011-10-22 MED ORDER — PROCHLORPERAZINE MALEATE 10 MG PO TABS: 10.0000 mg | ORAL_TABLET | Freq: Four times a day (QID) | ORAL | Status: DC | PRN

## 2011-10-23 ENCOUNTER — Telehealth: Payer: Self-pay | Admitting: Internal Medicine

## 2011-10-23 ENCOUNTER — Telehealth: Payer: Self-pay | Admitting: *Deleted

## 2011-10-23 DIAGNOSIS — C3490 Malignant neoplasm of unspecified part of unspecified bronchus or lung: Secondary | ICD-10-CM

## 2011-10-23 MED ORDER — PROCHLORPERAZINE MALEATE 10 MG PO TABS: 10.0000 mg | ORAL_TABLET | Freq: Four times a day (QID) | ORAL | Status: DC | PRN

## 2011-10-23 NOTE — Telephone Encounter (Signed)
Per Dr Donnald Garre, we need to transition him to get tx's on Monday.  1st tx will be a Wed, the following week Tuesday, the week after that will be Monday and will continue on Mondays.  Oncology tx filled out with specific instructions and pt's wife aware schedulers will call.    Pt's wife very anxious and does not want to have chemo education the same day as the MRI brain.  Called and left msg with Alleta to possibly r/s this appt to another time.  She is very concerned about all these appts and is not sure if he can go through all of this.  Lauren Mauri [place] Social worker is aware of the situation and is helping them with transportation issues.  SLJ

## 2011-10-23 NOTE — Telephone Encounter (Signed)
Discussed with pt and wife - will continue rx for now on basis of what the ca would do if left untreated

## 2011-10-23 NOTE — Telephone Encounter (Signed)
Spoke to wife and she would really like to speak directly with MW to discuss the options and what he (MW) thinks would be the best thing to do. Will forward to [facility]

## 2011-10-24 ENCOUNTER — Telehealth: Payer: Self-pay | Admitting: Medical Oncology

## 2011-10-24 ENCOUNTER — Encounter: Payer: Self-pay | Admitting: Cardiology

## 2011-10-24 ENCOUNTER — Ambulatory Visit (INDEPENDENT_AMBULATORY_CARE_PROVIDER_SITE_OTHER): Payer: Medicare Other | Admitting: Cardiology

## 2011-10-24 VITALS — BP 131/77 | HR 65 | Ht 70.0 in | Wt 141.0 lb

## 2011-10-24 DIAGNOSIS — I6529 Occlusion and stenosis of unspecified carotid artery: Secondary | ICD-10-CM

## 2011-10-24 NOTE — Patient Instructions (Addendum)
The current medical regimen is effective;  continue present plan and medications.  Follow up in 3 months with Dr Hochrein. 

## 2011-10-24 NOTE — Progress Notes (Signed)
HPI The patient presents as a new patient for evaluation of carotid stenosis. He has no prior cardiac history though he does report 60% left carotid stenosis diagnosed about a year ago at another cardiology practice. He also reports stress testing at that time. He does not report prior cardiac catheterization. He does not report any ongoing symptoms such as chest pressure, neck or arm discomfort. He doesn't notice any palpitations, presyncope or syncope. He has no PND or orthopnea. However, unfortunately he was recently found to have a lung cancer which is locally advanced squamous cell by the reports. He is to undergo chemotherapy with carboplatin and also DVT with radiation. He does do some activities such as vacuuming for his wife and doesn't report any significant limitations.  No Known Allergies  Current Outpatient Prescriptions  Medication Sig Dispense Refill  . aspirin 81 MG tablet Take 81 mg by mouth daily.      Marland Kitchen atorvastatin (LIPITOR) 20 MG tablet Take 20 mg by mouth daily with breakfast.       . chlorthalidone (HYGROTON) 25 MG tablet Take 0.5 tablets by mouth daily with breakfast.       . famotidine (PEPCID) 20 MG tablet Take 20 mg by mouth at bedtime.      Marland Kitchen KLOR-CON M10 10 MEQ tablet Take 1 tablet by mouth Daily.      . nebivolol (BYSTOLIC) 5 MG tablet Take 5 mg by mouth daily with breakfast.       . omeprazole (PRILOSEC) 20 MG capsule Take 20 mg by mouth daily before breakfast.      . prochlorperazine (COMPAZINE) 10 MG tablet Take 1 tablet (10 mg total) by mouth every 6 (six) hours as needed.  30 tablet  0    Past Medical History  Diagnosis Date  . HTN (hypertension)   . Lung nodule   . Vertigo 2010    Hospitalized a [facility]    Past Surgical History  Procedure Date  . Tonsillectomy   . Video bronchoscopy 09/20/2011    Procedure: VIDEO BRONCHOSCOPY WITHOUT FLUORO;  Surgeon: Nyoka Cowden, MD;  Location: [facility];  Service: Cardiopulmonary;  Laterality: Bilateral;     Family History  Problem Relation Age of Onset  . Cancer Brother     Bone    History   Social History  . Marital Status: Married    Spouse Name: San Lohmeyer    Number of Children: 3  . Years of Education: 12   Occupational History  . retired [employer]   Social History Main Topics  . Smoking status: Former Smoker -- 1.0 packs/day for 40 years    Types: Cigarettes    Quit date: 02/27/1993  . Smokeless tobacco: Never Used  . Alcohol Use: No  . Drug Use: No  . Sexually Active: Not on file   Other Topics Concern  . Not on file   Social History Narrative  . No narrative on file    ROS: Positive for constipation. Otherwise as stated in the HPI and negative for all other systems.   PHYSICAL EXAM BP 131/77  Pulse 65  Ht 5\' 10"  (1.778 m)  Wt 141 lb (63.957 kg)  BMI 20.23 kg/m2 GENERAL:  Well appearing HEENT:  Pupils equal round and reactive, fundi not visualized, oral mucosa unremarkable NECK:  No jugular venous distention, waveform within normal limits, carotid upstroke brisk and symmetric, bilateral bruit, no thyromegaly LYMPHATICS:  No cervical, inguinal adenopathy LUNGS:  Clear to auscultation bilaterally BACK:  No CVA tenderness CHEST:  Unremarkable HEART:  PMI not displaced or sustained,S1 and S2 within normal limits, no S3, no S4, no clicks, no rubs, no murmurs ABD:  Flat, positive bowel sounds normal in frequency in pitch, no bruits, no rebound, no guarding, no midline pulsatile mass, no hepatomegaly, no splenomegaly EXT:  2 plus pulses throughout, no edema, no cyanosis no clubbing SKIN:  No rashes no nodules NEURO:  Cranial nerves II through XII grossly intact, motor grossly intact throughout PSYCH:  Cognitively intact, oriented to person place and time  EKG:  Sinus rhythm, rate 63, axis within normal limits, intervals within normal limits, no acute ST-T wave changes.  10/24/2011  ASSESSMENT AND PLAN   Carotid stenosis - I  reviewed previous records to see if there are other issues. At this point he doesn't have any suggestion of angina or obstructive coronary disease. He's about to go through chemotherapy and radiation. I think we can defer followup of his carotid Dopplers until after he recovers from this. I will try to obtain the records from [facility] to see what other workup or diagnosis has been made in the past.  Dyslipidemia - Again with the impending chemotherapy and radiation I would defer followup on this until after he is recovered. With his carotid stenosis the goal ultimately would be an LDL less than 100 and HDL greater than 40.  Hypertension - The blood pressure is at target. No change in medications is indicated. We will continue with therapeutic lifestyle changes (TLC).

## 2011-10-24 NOTE — Telephone Encounter (Signed)
Wife notified that pt can have chemo class tomorrow . She wants to keep it on Thursday  but needs to be done by 1100 so he can eat before MRI. I told him I would notify education RN

## 2011-10-25 ENCOUNTER — Telehealth: Payer: Self-pay | Admitting: Internal Medicine

## 2011-10-25 ENCOUNTER — Telehealth: Payer: Self-pay

## 2011-10-25 NOTE — Telephone Encounter (Signed)
called pt and moved lab and md for tx pt  aom

## 2011-10-25 NOTE — Telephone Encounter (Signed)
Added additional lb/chemo for 9/16. Pt to get new schedule when he comes in for ched 8/29.

## 2011-10-25 NOTE — Telephone Encounter (Signed)
Patient called to inquire about being pro-active in getting all prescriptions needed prior to starting radiation.I did forward message to Dr.Manning to inquire about carafate prescription.

## 2011-10-26 ENCOUNTER — Ambulatory Visit (HOSPITAL_BASED_OUTPATIENT_CLINIC_OR_DEPARTMENT_OTHER): Payer: Medicare Other | Admitting: Lab

## 2011-10-26 ENCOUNTER — Encounter: Payer: Self-pay | Admitting: *Deleted

## 2011-10-26 ENCOUNTER — Other Ambulatory Visit: Payer: Medicare Other

## 2011-10-26 ENCOUNTER — Other Ambulatory Visit: Payer: Self-pay | Admitting: Medical Oncology

## 2011-10-26 ENCOUNTER — Ambulatory Visit (HOSPITAL_COMMUNITY)
Admission: RE | Admit: 2011-10-26 | Discharge: 2011-10-26 | Disposition: A | Discharge status: Home / Self Care | Payer: Medicare Other | Source: Ambulatory Visit | Attending: Internal Medicine | Admitting: Internal Medicine

## 2011-10-26 DIAGNOSIS — G319 Degenerative disease of nervous system, unspecified: Secondary | ICD-10-CM | POA: Insufficient documentation

## 2011-10-26 DIAGNOSIS — C3490 Malignant neoplasm of unspecified part of unspecified bronchus or lung: Secondary | ICD-10-CM

## 2011-10-26 DIAGNOSIS — J320 Chronic maxillary sinusitis: Secondary | ICD-10-CM | POA: Insufficient documentation

## 2011-10-26 LAB — COMPREHENSIVE METABOLIC PANEL (CC13)
ALT: 12 U/L (ref 0–55)
AST: 16 U/L (ref 5–34)
Albumin: 3.5 g/dL (ref 3.5–5.0)
Alkaline Phosphatase: 178 U/L — ABNORMAL HIGH (ref 40–150)
BUN: 24 mg/dL (ref 7.0–26.0)
CO2: 31 meq/L — ABNORMAL HIGH (ref 22–29)
Calcium: 9.3 mg/dL (ref 8.4–10.4)
Chloride: 100 meq/L (ref 98–107)
Creatinine: 1.2 mg/dL (ref 0.7–1.3)
Glucose: 94 mg/dL (ref 70–99)
Potassium: 4 meq/L (ref 3.5–5.1)
Sodium: 139 meq/L (ref 136–145)
Total Bilirubin: 0.5 mg/dL (ref 0.20–1.20)
Total Protein: 7 g/dL (ref 6.4–8.3)

## 2011-10-26 MED ORDER — GADOBENATE DIMEGLUMINE 529 MG/ML IV SOLN
13.0000 mL | Freq: Once | INTRAVENOUS | Status: AC | PRN
  Administered 2011-10-26: 13 mL via INTRAVENOUS

## 2011-10-27 ENCOUNTER — Telehealth: Payer: Self-pay

## 2011-10-27 NOTE — Telephone Encounter (Signed)
Left voice message to inform wife prescription for carafate called into [facility] on [address].

## 2011-10-31 ENCOUNTER — Ambulatory Visit
Admission: RE | Admit: 2011-10-31 | Discharge: 2011-10-31 | Disposition: A | Discharge status: Home / Self Care | Payer: Medicare Other | Source: Ambulatory Visit | Attending: Radiation Oncology | Admitting: Radiation Oncology

## 2011-10-31 ENCOUNTER — Telehealth: Payer: Self-pay

## 2011-10-31 DIAGNOSIS — C3490 Malignant neoplasm of unspecified part of unspecified bronchus or lung: Secondary | ICD-10-CM

## 2011-10-31 MED ORDER — RADIAPLEXRX EX GEL
Freq: Once | CUTANEOUS | Status: AC
  Administered 2011-10-31: 14:00:00 via TOPICAL

## 2011-10-31 NOTE — Progress Notes (Signed)
Received patient and his son in the clinic today for post sim education with Sam, Charity fundraiser. Patient is alert and oriented to person, place, and time. No distress noted. Steady gait noted. Pleasant affect noted. Patient denies pain at this time. Oriented patient to staff and routine of the clinic. Educated patient on potential side effects and management. Provided patient with Radiaplex and educated upon use. Provided patient with RADIATION THERAPY AND YOU handbook then, reviewed pertinent information. Provided patient with Colen Darling, RN's business card and encouraged to call with needs. All questions answered. Patient and his son both verbalized understanding of all reviewed.

## 2011-10-31 NOTE — Telephone Encounter (Signed)
e

## 2011-11-01 ENCOUNTER — Ambulatory Visit (HOSPITAL_BASED_OUTPATIENT_CLINIC_OR_DEPARTMENT_OTHER): Payer: Medicare Other

## 2011-11-01 ENCOUNTER — Other Ambulatory Visit: Payer: Self-pay | Admitting: Internal Medicine

## 2011-11-01 ENCOUNTER — Ambulatory Visit
Admission: RE | Admit: 2011-11-01 | Discharge: 2011-11-01 | Disposition: A | Discharge status: Home / Self Care | Payer: Medicare Other | Source: Ambulatory Visit | Attending: Radiation Oncology | Admitting: Radiation Oncology

## 2011-11-01 ENCOUNTER — Telehealth: Payer: Self-pay | Admitting: *Deleted

## 2011-11-01 ENCOUNTER — Other Ambulatory Visit (HOSPITAL_BASED_OUTPATIENT_CLINIC_OR_DEPARTMENT_OTHER): Payer: Medicare Other | Admitting: Lab

## 2011-11-01 VITALS — BP 153/59 | HR 59 | Temp 97.1°F | Resp 18

## 2011-11-01 DIAGNOSIS — C3430 Malignant neoplasm of lower lobe, unspecified bronchus or lung: Secondary | ICD-10-CM

## 2011-11-01 DIAGNOSIS — C3490 Malignant neoplasm of unspecified part of unspecified bronchus or lung: Secondary | ICD-10-CM

## 2011-11-01 DIAGNOSIS — Z5111 Encounter for antineoplastic chemotherapy: Secondary | ICD-10-CM

## 2011-11-01 LAB — COMPREHENSIVE METABOLIC PANEL (CC13)
ALT: 35 U/L (ref 0–55)
AST: 30 U/L (ref 5–34)
Alkaline Phosphatase: 229 U/L — ABNORMAL HIGH (ref 40–150)
BUN: 28 mg/dL — ABNORMAL HIGH (ref 7.0–26.0)
Creatinine: 1.2 mg/dL (ref 0.7–1.3)
Total Bilirubin: 0.6 mg/dL (ref 0.20–1.20)

## 2011-11-01 LAB — CBC WITH DIFFERENTIAL/PLATELET
Basophils Absolute: 0 10*3/uL (ref 0.0–0.1)
EOS%: 4.2 % (ref 0.0–7.0)
HCT: 34.9 % — ABNORMAL LOW (ref 38.4–49.9)
HGB: 11.8 g/dL — ABNORMAL LOW (ref 13.0–17.1)
LYMPH%: 21.1 % (ref 14.0–49.0)
MCH: 26.7 pg — ABNORMAL LOW (ref 27.2–33.4)
MCV: 79 fL — ABNORMAL LOW (ref 79.3–98.0)
MONO%: 8.7 % (ref 0.0–14.0)
NEUT%: 65.8 % (ref 39.0–75.0)
Platelets: 274 10*3/uL (ref 140–400)
lymph#: 2 10*3/uL (ref 0.9–3.3)

## 2011-11-01 MED ORDER — FAMOTIDINE IN NACL 20-0.9 MG/50ML-% IV SOLN
20.0000 mg | Freq: Once | INTRAVENOUS | Status: AC
  Administered 2011-11-01: 20 mg via INTRAVENOUS

## 2011-11-01 MED ORDER — DEXAMETHASONE SODIUM PHOSPHATE 4 MG/ML IJ SOLN
20.0000 mg | Freq: Once | INTRAMUSCULAR | Status: AC
  Administered 2011-11-01: 20 mg via INTRAVENOUS

## 2011-11-01 MED ORDER — SODIUM CHLORIDE 0.9 % IV SOLN
142.6000 mg | Freq: Once | INTRAVENOUS | Status: AC
  Administered 2011-11-01: 140 mg via INTRAVENOUS
  Filled 2011-11-01: qty 14

## 2011-11-01 MED ORDER — ONDANSETRON 16 MG/50ML IVPB (CHCC)
16.0000 mg | Freq: Once | INTRAVENOUS | Status: AC
  Administered 2011-11-01: 16 mg via INTRAVENOUS

## 2011-11-01 MED ORDER — PACLITAXEL CHEMO INJECTION 300 MG/50ML
45.0000 mg/m2 | Freq: Once | INTRAVENOUS | Status: AC
  Administered 2011-11-01: 78 mg via INTRAVENOUS
  Filled 2011-11-01: qty 13

## 2011-11-01 MED ORDER — DIPHENHYDRAMINE HCL 50 MG/ML IJ SOLN
50.0000 mg | Freq: Once | INTRAMUSCULAR | Status: AC
  Administered 2011-11-01: 50 mg via INTRAVENOUS

## 2011-11-01 MED ORDER — SODIUM CHLORIDE 0.9 % IV SOLN
Freq: Once | INTRAVENOUS | Status: AC
  Administered 2011-11-01: 13:00:00 via INTRAVENOUS

## 2011-11-01 NOTE — Patient Instructions (Signed)
[facility] Discharge Instructions for Patients Receiving Chemotherapy  Today you received the following chemotherapy agents Carboplatin ,taxol To help prevent nausea and vomiting after your treatment, we encourage you to take your nausea medication   Take it as often as prescribed.   If you develop nausea and vomiting that is not controlled by your nausea medication, call the clinic. If it is after clinic hours your family physician or the after hours number for the clinic or go to the Emergency Department.   BELOW ARE SYMPTOMS THAT SHOULD BE REPORTED IMMEDIATELY:  *FEVER GREATER THAN 100.5 F  *CHILLS WITH OR WITHOUT FEVER  NAUSEA AND VOMITING THAT IS NOT CONTROLLED WITH YOUR NAUSEA MEDICATION  *UNUSUAL SHORTNESS OF BREATH  *UNUSUAL BRUISING OR BLEEDING  TENDERNESS IN MOUTH AND THROAT WITH OR WITHOUT PRESENCE OF ULCERS  *URINARY PROBLEMS  *BOWEL PROBLEMS  UNUSUAL RASH Items with * indicate a potential emergency and should be followed up as soon as possible.  If this is your first treatment one of the nurses will contact you 24 hours after your treatment. Please let the nurse know about any problems that you may have experienced. Feel free to call the clinic you have any questions or concerns. The clinic phone number is [phone].   I have been informed and understand all the instructions given to me. I know to contact the clinic, my physician, or go to the Emergency Department if any problems should occur. I do not have any questions at this time, but understand that I may call the clinic during office hours   should I have any questions or need assistance in obtaining follow up care.    __________________________________________  _____________  __________ Signature of Patient or Authorized Representative            Date                   Time    __________________________________________ Nurse's Signature

## 2011-11-01 NOTE — Telephone Encounter (Signed)
Returned call from pt's wife who states she is "afraid pt may not be able to drive self for tx depending on what side effects he experiences from chemo and radiation". Wife is unable to drive; states she has tried to find transportation help, but has been unsuccessful. Informed Ms Grasse will route her request to SW.  Left vm for L Mullis SW; spoke w/A Ramond Dial SW who will call Ms Gilham re: transpo options.

## 2011-11-02 ENCOUNTER — Encounter: Payer: Self-pay | Admitting: Radiation Oncology

## 2011-11-02 ENCOUNTER — Ambulatory Visit
Admission: RE | Admit: 2011-11-02 | Discharge: 2011-11-02 | Disposition: A | Discharge status: Home / Self Care | Payer: Medicare Other | Source: Ambulatory Visit | Attending: Radiation Oncology | Admitting: Radiation Oncology

## 2011-11-02 ENCOUNTER — Telehealth: Payer: Self-pay | Admitting: *Deleted

## 2011-11-02 VITALS — BP 151/61 | HR 63 | Temp 97.4°F | Wt 139.4 lb

## 2011-11-02 DIAGNOSIS — C3490 Malignant neoplasm of unspecified part of unspecified bronchus or lung: Secondary | ICD-10-CM

## 2011-11-02 NOTE — Progress Notes (Signed)
3 fractions to lung.  No voiced complaints.  Reviewed reasons for Carafate and how to take QID.  Emphasized need to inform us of any difficulty swallowing or pain on swallowing.  Instructed to check mouth daily for white patches since he is receiving steroid with chemotherapy.  Mouth clear today.  Has received 1 cycle of chemotherapy.

## 2011-11-02 NOTE — Telephone Encounter (Signed)
Message copied by Augusto Garbe on Thu Nov 02, 2011  1:41 PM ------      Message from: Charma Igo      Created: Wed Nov 01, 2011  1:56 PM       1st taxol, Ledell Noss 9/4

## 2011-11-02 NOTE — Progress Notes (Signed)
  Radiation Oncology         [phone] ________________________________  Name: Justin Pittman MRN: [id number]  Date: 11/02/2011  DOB: 1932/05/25  Weekly Radiation Therapy Management  Current Dose: 6 Gy     Planned Dose:  66 Gy  Narrative . . . . . . . . The patient presents for routine under treatment assessment.                                               The patient is without complaint.                                 Set-up films were reviewed.                                 The chart was checked. Physical Findings. . . Weight essentially stable.  No significant changes. Impression . . . . . . . The patient is  tolerating radiation. Plan . . . . . . . . . . . . Continue treatment as planned.  ________________________________  Artist Pais. Kathrynn Running, M.D.

## 2011-11-02 NOTE — Telephone Encounter (Signed)
Message left requesting a return call for chemotherapy f./u.  Asked that he drink fluids well and call to ask questions and reports any symptoms.

## 2011-11-03 ENCOUNTER — Telehealth: Payer: Self-pay | Admitting: *Deleted

## 2011-11-03 ENCOUNTER — Ambulatory Visit
Admission: RE | Admit: 2011-11-03 | Discharge: 2011-11-03 | Disposition: A | Discharge status: Home / Self Care | Payer: Medicare Other | Source: Ambulatory Visit | Attending: Radiation Oncology | Admitting: Radiation Oncology

## 2011-11-03 ENCOUNTER — Encounter: Payer: Self-pay | Admitting: Radiation Oncology

## 2011-11-03 DIAGNOSIS — C3490 Malignant neoplasm of unspecified part of unspecified bronchus or lung: Secondary | ICD-10-CM

## 2011-11-03 DIAGNOSIS — R918 Other nonspecific abnormal finding of lung field: Secondary | ICD-10-CM

## 2011-11-03 NOTE — Progress Notes (Signed)
  Radiation Oncology         [phone]) [phone] ________________________________  Name: Justin Pittman MRN: [id number]  Date: 11/03/2011  DOB: Jul 21, 1932  Chart Note: Justin Pittman is currently receiving concurrent chemoradiation. He telephoned our office this morning. He and his wife noted a skin rash on his trunk which developed yesterday and was erythematous and somewhat pruritic. However, it has improved since his initial development. He was asked to be seen today to determine whether this could be related radiation or his recent initiation of chemotherapy.  I obtained a few digital images for his record. As you can see below, patient does have some patchy erythematous changes but no plaque-like skin induration, vesicles or other disruption of the epidermis per se.            Impression:  The skin reaction described above and shown does not appear to reflect radiation dermatitis. It could be a reaction to one of his recent medication exposures possibly related to chemotherapy.  However, this was a minor reaction and I suspect it may not warrant any change in therapy unless it recurs or worsens. Regardless, I will direct Dr. Arbutus Ped to these images and the recent history for his assessment prior to proceeding with second cycle of chemotherapy.   Plan:  At this point, the patient is set up to proceed with  continued radiation to the chest.   ________________________________  Justin Pittman, M.D.

## 2011-11-03 NOTE — Progress Notes (Signed)
Checked patient skin on front of chest and back, he does have  Splotchy areas on back, red rash, small , and on arm and front of chest,wife stated she spoke with Dr.Sherrill last night, was told it could be from chemotherapy or radiation, " will have MD look after patients treatment today, patient denies itching, wife states rash has diminished , she  Was worried what was causing the rash and if it was going to get worse, only thing that patient has changed is his soap from right guard to dove, no detergent changes, no new foods, no new clothes,,will have MD on call see patient 11:51 AM

## 2011-11-03 NOTE — Telephone Encounter (Signed)
Pt's wife returned call, stating that pt has been feeling fine after his chemo.  Pt is also being assessed in radiation oncology due to a new rash that he noticed this week but that seems to be resolving.  Pt is seeing Dr Donnald Garre on 9/10 for routine f/u.  SLJ

## 2011-11-03 NOTE — Telephone Encounter (Signed)
Returned call from pt's wife who states she noticed rash on pt's back Wed evening; pt received 1st chemo treatment Wed, 11/01/11 @ 12:45 pm. Pt has completed 3 radiation tx to right lung. Wife states yesterday rash spread to pt's back, chest and stomach but rash diminished today. Pt has not c/o itching or had swelling. Pt did not apply Radiaplex lotion to right chest tx area prior to rash being noticed by wife. Advised wife rash is not due to radiation tx; she requests pt's skin be checked prior to radiation tx today. Advised wife to bring pt in @ 11:30 am for RN to assess skin. Wife verbalized understanding, agreement. Sent in-basket note to Dr Asa Lente RN, Dr Kathrynn Running.

## 2011-11-06 ENCOUNTER — Ambulatory Visit
Admission: RE | Admit: 2011-11-06 | Discharge: 2011-11-06 | Disposition: A | Discharge status: Home / Self Care | Payer: Medicare Other | Source: Ambulatory Visit | Attending: Radiation Oncology | Admitting: Radiation Oncology

## 2011-11-06 ENCOUNTER — Ambulatory Visit: Payer: Medicare Other

## 2011-11-07 ENCOUNTER — Other Ambulatory Visit: Payer: Medicare Other | Admitting: Lab

## 2011-11-07 ENCOUNTER — Ambulatory Visit (HOSPITAL_BASED_OUTPATIENT_CLINIC_OR_DEPARTMENT_OTHER): Payer: Medicare Other

## 2011-11-07 ENCOUNTER — Ambulatory Visit: Payer: Medicare Other | Admitting: Internal Medicine

## 2011-11-07 ENCOUNTER — Other Ambulatory Visit (HOSPITAL_BASED_OUTPATIENT_CLINIC_OR_DEPARTMENT_OTHER): Payer: Medicare Other | Admitting: Lab

## 2011-11-07 ENCOUNTER — Telehealth: Payer: Self-pay | Admitting: Internal Medicine

## 2011-11-07 ENCOUNTER — Ambulatory Visit (HOSPITAL_BASED_OUTPATIENT_CLINIC_OR_DEPARTMENT_OTHER): Payer: Medicare Other | Admitting: Internal Medicine

## 2011-11-07 ENCOUNTER — Ambulatory Visit
Admission: RE | Admit: 2011-11-07 | Discharge: 2011-11-07 | Disposition: A | Discharge status: Home / Self Care | Payer: Medicare Other | Source: Ambulatory Visit | Attending: Radiation Oncology | Admitting: Radiation Oncology

## 2011-11-07 VITALS — BP 108/63 | HR 90 | Temp 96.8°F | Resp 20 | Ht 70.0 in | Wt 136.0 lb

## 2011-11-07 DIAGNOSIS — C3490 Malignant neoplasm of unspecified part of unspecified bronchus or lung: Secondary | ICD-10-CM

## 2011-11-07 DIAGNOSIS — C3430 Malignant neoplasm of lower lobe, unspecified bronchus or lung: Secondary | ICD-10-CM

## 2011-11-07 DIAGNOSIS — Z5111 Encounter for antineoplastic chemotherapy: Secondary | ICD-10-CM

## 2011-11-07 LAB — CBC WITH DIFFERENTIAL/PLATELET
BASO%: 0.5 % (ref 0.0–2.0)
LYMPH%: 12.5 % — ABNORMAL LOW (ref 14.0–49.0)
MCHC: 33.3 g/dL (ref 32.0–36.0)
MCV: 79.2 fL — ABNORMAL LOW (ref 79.3–98.0)
MONO%: 8.6 % (ref 0.0–14.0)
Platelets: 272 10*3/uL (ref 140–400)
RBC: 4.43 10*6/uL (ref 4.20–5.82)
RDW: 13.9 % (ref 11.0–14.6)
WBC: 8.2 10*3/uL (ref 4.0–10.3)
nRBC: 0 % (ref 0–0)

## 2011-11-07 LAB — COMPREHENSIVE METABOLIC PANEL (CC13)
ALT: 23 U/L (ref 0–55)
AST: 20 U/L (ref 5–34)
Alkaline Phosphatase: 189 U/L — ABNORMAL HIGH (ref 40–150)
Creatinine: 1.4 mg/dL — ABNORMAL HIGH (ref 0.7–1.3)
Total Bilirubin: 0.9 mg/dL (ref 0.20–1.20)

## 2011-11-07 MED ORDER — PACLITAXEL CHEMO INJECTION 300 MG/50ML
45.0000 mg/m2 | Freq: Once | INTRAVENOUS | Status: AC
  Administered 2011-11-07: 78 mg via INTRAVENOUS
  Filled 2011-11-07: qty 13

## 2011-11-07 MED ORDER — DEXAMETHASONE SODIUM PHOSPHATE 4 MG/ML IJ SOLN
20.0000 mg | Freq: Once | INTRAMUSCULAR | Status: AC
  Administered 2011-11-07: 20 mg via INTRAVENOUS

## 2011-11-07 MED ORDER — FAMOTIDINE IN NACL 20-0.9 MG/50ML-% IV SOLN
20.0000 mg | Freq: Once | INTRAVENOUS | Status: AC
Start: 2011-11-07 — End: 2011-11-07
  Administered 2011-11-07: 20 mg via INTRAVENOUS

## 2011-11-07 MED ORDER — SODIUM CHLORIDE 0.9 % IV SOLN
142.6000 mg | Freq: Once | INTRAVENOUS | Status: AC
  Administered 2011-11-07: 140 mg via INTRAVENOUS
  Filled 2011-11-07: qty 14

## 2011-11-07 MED ORDER — ONDANSETRON 16 MG/50ML IVPB (CHCC)
16.0000 mg | Freq: Once | INTRAVENOUS | Status: AC
  Administered 2011-11-07: 16 mg via INTRAVENOUS

## 2011-11-07 MED ORDER — SODIUM CHLORIDE 0.9 % IV SOLN: Freq: Once | INTRAVENOUS | Status: DC

## 2011-11-07 MED ORDER — DIPHENHYDRAMINE HCL 50 MG/ML IJ SOLN
50.0000 mg | Freq: Once | INTRAMUSCULAR | Status: AC
  Administered 2011-11-07: 50 mg via INTRAVENOUS

## 2011-11-07 NOTE — Patient Instructions (Signed)
[facility] Discharge Instructions for Patients Receiving Chemotherapy  Today you received the following chemotherapy agents :  Taxol/carbo  To help prevent nausea and vomiting after your treatment, we encourage you to take your nausea medication  If you develop nausea and vomiting that is not controlled by your nausea medication, call the clinic. If it is after clinic hours your family physician or the after hours number for the clinic or go to the Emergency Department.   BELOW ARE SYMPTOMS THAT SHOULD BE REPORTED IMMEDIATELY:  *FEVER GREATER THAN 100.5 F  *CHILLS WITH OR WITHOUT FEVER  NAUSEA AND VOMITING THAT IS NOT CONTROLLED WITH YOUR NAUSEA MEDICATION  *UNUSUAL SHORTNESS OF BREATH  *UNUSUAL BRUISING OR BLEEDING  TENDERNESS IN MOUTH AND THROAT WITH OR WITHOUT PRESENCE OF ULCERS  *URINARY PROBLEMS  *BOWEL PROBLEMS  UNUSUAL RASH Items with * indicate a potential emergency and should be followed up as soon as possible.   Please let the nurse know about any problems that you may have experienced. Feel free to call the clinic you have any questions or concerns. The clinic phone number is [phone].   I have been informed and understand all the instructions given to me. I know to contact the clinic, my physician, or go to the Emergency Department if any problems should occur. I do not have any questions at this time, but understand that I may call the clinic during office hours   should I have any questions or need assistance in obtaining follow up care.    __________________________________________  _____________  __________ Signature of Patient or Authorized Representative            Date                   Time    __________________________________________ Nurse's Signature

## 2011-11-07 NOTE — Patient Instructions (Signed)
MRI of the brain showed no evidence for metastatic disease. Continue with concurrent chemoradiation as scheduled. Followup in 2 weeks

## 2011-11-07 NOTE — Progress Notes (Signed)
[facility] [phone]   [phone]  OFFICE PROGRESS NOTE  [name], MD [address] [facility], [place]. [place] [address]  DIAGNOSIS: Stage IIIB non-small cell lung cancer, squamous cell carcinoma with right lower lobe lung mass as well as left hilar lymphadenopathy diagnosed in August of 2013.  PRIOR THERAPY: None  CURRENT THERAPY: Concurrent chemoradiation with weekly carboplatin for AUC of 2 and paclitaxel 45 mg/M2. The patient is status post 1 cycle.  INTERVAL HISTORY: Justin Pittman 78 y.o. male returns to the clinic today for followup visit accompanied by his son. The patient is doing fine and tolerated the first weekly dose of concurrent chemoradiation fairly well with no significant adverse effects. He denied having any significant nausea or vomiting, no fever or chills. He denied having any significant chest pain but continues to have shortness breath with exertion, no cough or hemoptysis. He has no significant weight loss or night sweats. He has MRI of the brain performed recently that showed no evidence for disease metastasis to the brain.  MEDICAL HISTORY: Past Medical History  Diagnosis Date  . HTN (hypertension)   . Lung cancer     Squamous Cell  . Vertigo 2010    Hospitalized a [facility]  . Hyperlipidemia     ALLERGIES:   has no known allergies.  MEDICATIONS:  Current Outpatient Prescriptions  Medication Sig Dispense Refill  . aspirin 81 MG tablet Take 81 mg by mouth daily.      Marland Kitchen atorvastatin (LIPITOR) 20 MG tablet Take 20 mg by mouth daily with breakfast.       . chlorthalidone (HYGROTON) 25 MG tablet Take 0.5 tablets by mouth daily with breakfast.       . famotidine (PEPCID) 20 MG tablet Take 20 mg by mouth at bedtime.      Marland Kitchen KLOR-CON M10 10 MEQ tablet Take 1 tablet by mouth Daily.      . nebivolol (BYSTOLIC) 5 MG tablet Take 5 mg by mouth daily with breakfast.       . omeprazole (PRILOSEC)  20 MG capsule Take 20 mg by mouth daily before breakfast.      . sucralfate (CARAFATE) 1 G tablet Take 1 g by mouth 4 (four) times daily. Dissolve 1 tab. In water qid ac 5 min. Before food prn discomfort.Disp. # 60 w/ 3 refills Called into [facility] on [address].      . Wound Cleansers (RADIAPLEX EX) Apply topically.      . prochlorperazine (COMPAZINE) 10 MG tablet Take 1 tablet (10 mg total) by mouth every 6 (six) hours as needed.  30 tablet  0    SURGICAL HISTORY:  Past Surgical History  Procedure Date  . Tonsillectomy   . Video bronchoscopy 09/20/2011    Procedure: VIDEO BRONCHOSCOPY WITHOUT FLUORO;  Surgeon: Nyoka Cowden, MD;  Location: [facility];  Service: Cardiopulmonary;  Laterality: Bilateral;    REVIEW OF SYSTEMS:  A comprehensive review of systems was negative except for: Constitutional: positive for fatigue Respiratory: positive for dyspnea on exertion   PHYSICAL EXAMINATION: General appearance: alert, cooperative and no distress Head: Normocephalic, without obvious abnormality, atraumatic Neck: no adenopathy Lymph nodes: Cervical, supraclavicular, and axillary nodes normal. Resp: clear to auscultation bilaterally Cardio: regular rate and rhythm, S1, S2 normal, no murmur, click, rub or gallop GI: soft, non-tender; bowel sounds normal; no masses,  no organomegaly Extremities: extremities normal, atraumatic, no cyanosis or edema Neurologic: Alert and oriented X  3, normal strength and tone. Normal symmetric reflexes. Normal coordination and gait  ECOG PERFORMANCE STATUS: 1 - Symptomatic but completely ambulatory  Blood pressure 108/63, pulse 90, temperature 96.8 F (36 C), temperature source Oral, resp. rate 20, height 5\' 10"  (1.778 m), weight 136 lb (61.689 kg).  LABORATORY DATA: Lab Results  Component Value Date   WBC 8.2 11/07/2011   HGB 11.7* 11/07/2011   HCT 35.1* 11/07/2011   MCV 79.2* 11/07/2011   PLT 272 11/07/2011      Chemistry      Component Value  Date/Time   NA 137 11/01/2011 1213   NA 138 08/17/2011 0936   K 3.8 11/01/2011 1213   K 4.3 08/17/2011 0936   CL 99 11/01/2011 1213   CL 97 08/17/2011 0936   CO2 26 11/01/2011 1213   CO2 30 08/17/2011 0936   BUN 28.0* 11/01/2011 1213   BUN 21 08/17/2011 0936   CREATININE 1.2 11/01/2011 1213   CREATININE 1.2 08/17/2011 0936      Component Value Date/Time   CALCIUM 9.7 11/01/2011 1213   CALCIUM 9.5 08/17/2011 0936   ALKPHOS 229* 11/01/2011 1213   ALKPHOS 87 10/27/2008 2016   AST 30 11/01/2011 1213   AST 20 10/27/2008 2016   ALT 35 11/01/2011 1213   ALT 20 10/27/2008 2016   BILITOT 0.60 11/01/2011 1213   BILITOT 0.9 10/27/2008 2016       RADIOGRAPHIC STUDIES: Dg Chest 1 View  10/11/2011  *RADIOLOGY REPORT*  Clinical Data: Post right lung biopsy.  CHEST - 1 VIEW  Comparison: 10/06/2011  Findings: Right perihilar mass noted.  No pneumothorax following lung biopsy.  Mild hyperinflation.  Heart is normal size.  No acute infiltrates or effusions.  IMPRESSION: No pneumothorax following right lung biopsy.  Original Report Authenticated By: Cyndie Chime, M.D.   Mr Justin Pittman Wo Contrast  10/26/2011  *RADIOLOGY REPORT*  Clinical Data: Squamous cell lung cancer.  Rule out metastatic disease  MRI HEAD WITHOUT AND WITH CONTRAST  Technique:  Multiplanar, multiecho pulse sequences of the brain and surrounding structures were obtained according to standard protocol without and with intravenous contrast  Contrast: 13mL MULTIHANCE GADOBENATE DIMEGLUMINE 529 MG/ML IV SOLN  Comparison: CT 10/27/2008  Findings: Mild atrophy.  Multiple small nonenhancing white matter hyperintensities in the cerebral white matter bilaterally are most likely related to chronic microvascular ischemia.  No acute infarct.  Negative for hemorrhage or mass lesion.  Negative for hydrocephalus.  Following contrast infusion, no enhancing lesions are identified in the brain.  Air-fluid level right maxillary sinus compatible with sinusitis. Mucosal edema in the  frontal and ethmoid sinuses.  IMPRESSION: Negative for metastatic disease to the brain.  Sinusitis with air-fluid level right maxillary sinus.  Atrophy and chronic microvascular ischemic changes.   Original Report Authenticated By: Camelia Phenes, M.D.    Ct Biopsy  10/11/2011  *RADIOLOGY REPORT*  CT GUIDED LUNG BIOPSY  Date: 10/11/2011  Clinical History: 78 year old male with a mass in the superior segment of the right lower lobe.  He underwent endoscopic biopsy with a nondiagnostic result and now presents for CT-guided biopsy to further facilitate tissue diagnosis.  Procedures Performed: 1. CT guided biopsy of mass in the superior segment of the right lower lobe  Interventional Radiologist:  Sterling Big, MD  Sedation: Moderate (conscious) sedation was used.  One mg Versed, 50 mcg Fentanyl were administered intravenously.  The patient's vital signs were monitored continuously by radiology nursing throughout the procedure.  Sedation  Time: 30 minutes  PROCEDURE/FINDINGS:   Informed consent was obtained from the patient following explanation of the procedure, risks, benefits and alternatives. The patient understands, agrees and consents for the procedure. All questions were addressed. A time out was performed.  Maximal barrier sterile technique utilized including caps, mask, sterile gowns, sterile gloves, large sterile drape, hand hygiene, and betadine skin prep.  A planning axial CT scan was obtained.  The right infrahilar mass in the superior segment of the lower lobe was identified.  A suitable skin entry site was selected and marked.  Local anesthesia was achieved with infiltration of 1% lidocaine.  Using CT fluoroscopic guidance, a 17 gauge trocar needle was advanced through the skin, underlying soft tissues and pleura into the lung. The needle tip was then positioned just within the mass.  Given the history of a prior nondiagnostic biopsy, multiple fine needle aspirates were obtained and sent for on  site cytopathologic evaluation.  Cytopathology confirmed adequate sampling.  Therefore, several 18-gauge core biopsies were obtained to facilitate further immunohistochemical and genetic testing.  The needle was removed.  A post biopsy axial CT scan was performed which demonstrated minimal hemorrhage around the mass, and no evidence of pneumothorax.  The patient tolerated the procedure well, there is no immediate complication.  After an appropriate observation time, the patient was discharged home in stable condition.  IMPRESSION:  Technically successful CT guided fine needle aspiration and core biopsy of pulmonary mass in the superior segment of the right lower lobe.  Signed,  Sterling Big, MD Vascular & Interventional Radiologist [facility] Rad[facility]  Original Report Authenticated By: Vilma Prader    ASSESSMENT: This is a very pleasant 78 years old white male with stage IIIB non-small cell lung cancer, currently undergoing concurrent chemoradiation with weekly carboplatin and paclitaxel is status post 1 dose. The patient is doing fine and tolerating his treatment fairly well.  PLAN: I discussed the MRI results with the patient and his son. I recommended for him to continue with his current treatment with concurrent chemoradiation as scheduled. He will proceed with cycle #2 today. He would come back for followup visit in 2 weeks for evaluation and management any adverse effect of his treatment. The patient was advised to call me immediately she has any concerning symptoms in the interval.  All questions were answered. The patient knows to call the clinic with any problems, questions or concerns. We can certainly see the patient much sooner if necessary.  I spent 15 minutes counseling the patient face to face. The total time spent in the appointment was 25 minutes.

## 2011-11-07 NOTE — Telephone Encounter (Signed)
Gave pt appt for September 2013 lab , ML and MD then October lab, and chemo

## 2011-11-08 ENCOUNTER — Ambulatory Visit
Admission: RE | Admit: 2011-11-08 | Discharge: 2011-11-08 | Disposition: A | Discharge status: Home / Self Care | Payer: Medicare Other | Source: Ambulatory Visit | Attending: Radiation Oncology | Admitting: Radiation Oncology

## 2011-11-09 ENCOUNTER — Ambulatory Visit
Admission: RE | Admit: 2011-11-09 | Discharge: 2011-11-09 | Disposition: A | Discharge status: Home / Self Care | Payer: Medicare Other | Source: Ambulatory Visit | Attending: Radiation Oncology | Admitting: Radiation Oncology

## 2011-11-09 ENCOUNTER — Telehealth: Payer: Self-pay | Admitting: *Deleted

## 2011-11-09 NOTE — Telephone Encounter (Signed)
Wife Justin Pittman called reporting Dwaine' heart rate = 92 and has been in the nineties since noon today.  Reports he feels tired.  Denies s.o.b., chest pain or palpitations.  Skin is warm and dry.  Heart rate = 90 on 11-07-2011 and he has fluctuated from 50's to 90's per vital records.  Explained to wife that a normal sinus rhythm rate = 60 to 100 and he is fine since symptoms are negative at this time.  Asked that she get to to ER or call 911 if symptoms assessed become positive.  Asked if he should not receive radiation tomorrow and instructed to call Dr. Broadus John office.

## 2011-11-09 NOTE — Telephone Encounter (Signed)
Received call from pt's wife who states pt's "heart rate is 92 and it is usually between 60-70". Pt's BP 104/70. She is concerned that pt's radiation tx is causing his heart rate to increase. Pt receiving radiation to his right lung, and received tx this morning, then per wife, went to a store, ate lunch then laid down at home to rest. She states pt "is awake and folding laundry now". She states pt has no other c/o but is fatigued.  Informed wife that radiation tx should not affect his heart rate; wife should check HR again this afternoon and take pt to ED if HR > 100. Also advised pt to come for tx 15 min early tomorrow and this RN will check VS before tx. Informed her pt will see Dr Kathrynn Running tomorrow as well. Wife continued to express concern, so this RN advised she call pt's cardiologist or PCP office and speak w/nurse, suggesting pt could possibly be seen today if she called now. Wife stated she would check pt's HR a few times later today and plan to have pt at cancer center 15 min before his radiation tx tomorrow for VS check. Pt's schedule flagged per above plan.

## 2011-11-10 ENCOUNTER — Encounter: Payer: Self-pay | Admitting: Radiation Oncology

## 2011-11-10 ENCOUNTER — Ambulatory Visit
Admission: RE | Admit: 2011-11-10 | Discharge: 2011-11-10 | Disposition: A | Discharge status: Home / Self Care | Payer: Medicare Other | Source: Ambulatory Visit | Attending: Radiation Oncology | Admitting: Radiation Oncology

## 2011-11-10 VITALS — BP 154/76 | HR 76 | Temp 97.7°F | Resp 20 | Wt 138.3 lb

## 2011-11-10 DIAGNOSIS — C3490 Malignant neoplasm of unspecified part of unspecified bronchus or lung: Secondary | ICD-10-CM

## 2011-11-10 DIAGNOSIS — I10 Essential (primary) hypertension: Secondary | ICD-10-CM

## 2011-11-10 MED ORDER — NEBIVOLOL HCL 5 MG PO TABS: 2.5000 mg | ORAL_TABLET | Freq: Every day | ORAL | Status: DC

## 2011-11-10 NOTE — Progress Notes (Signed)
Pt states he was coughing this morning, gagged and "threw up yellow phlegm". Reports fatigue, denies pain, loss of appetite, sob. Applying Radiaplex to right chest, no skin changes noted.

## 2011-11-10 NOTE — Progress Notes (Signed)
  Radiation Oncology         [phone] ________________________________  Name: Justin Pittman MRN: [id number]  Date: 11/10/2011  DOB: [birth year]  Weekly Radiation Therapy Management Malignant neoplasm of bronchus and lung, unspecified site Receiving radiation 9 out of 33 fractions   Current Dose: 18 Gy     Planned Dose:  66 Gy  Narrative . . . . . . . . The patient presents for routine under treatment assessment.                                                     The patient is without complaint.                                 Set-up films were reviewed.                                 The chart was checked. Physical Findings. . . Weight essentially stable.  No significant changes. Filed Vitals:   11/10/11 1043  BP: 154/76  Pulse: 76  Temp: 97.7 F (36.5 C)  Resp: 20   Filed Weights   11/10/11 1043  Weight: 138 lb 4.8 oz (62.732 kg)   Impression . . . . . . . The patient is  tolerating radiation. Plan . . . . . . . . . . . . Continue treatment as planned.  ________________________________  Artist Pais. Kathrynn Running, M.D.

## 2011-11-10 NOTE — Assessment & Plan Note (Signed)
Receiving radiation 9 out of 33 fractions

## 2011-11-10 NOTE — Addendum Note (Signed)
Encounter addended by: Oneita Hurt, MD on: 11/10/2011 11:10 AM<BR>     Documentation filed: Visit Diagnoses, Orders

## 2011-11-13 ENCOUNTER — Ambulatory Visit (HOSPITAL_BASED_OUTPATIENT_CLINIC_OR_DEPARTMENT_OTHER): Payer: Medicare Other

## 2011-11-13 ENCOUNTER — Other Ambulatory Visit (HOSPITAL_BASED_OUTPATIENT_CLINIC_OR_DEPARTMENT_OTHER): Payer: Medicare Other | Admitting: Lab

## 2011-11-13 ENCOUNTER — Ambulatory Visit
Admission: RE | Admit: 2011-11-13 | Discharge: 2011-11-13 | Disposition: A | Discharge status: Home / Self Care | Payer: Medicare Other | Source: Ambulatory Visit | Attending: Radiation Oncology | Admitting: Radiation Oncology

## 2011-11-13 VITALS — BP 156/84 | HR 76 | Temp 98.1°F | Resp 18

## 2011-11-13 DIAGNOSIS — C3490 Malignant neoplasm of unspecified part of unspecified bronchus or lung: Secondary | ICD-10-CM

## 2011-11-13 DIAGNOSIS — C3430 Malignant neoplasm of lower lobe, unspecified bronchus or lung: Secondary | ICD-10-CM

## 2011-11-13 DIAGNOSIS — Z5111 Encounter for antineoplastic chemotherapy: Secondary | ICD-10-CM

## 2011-11-13 LAB — COMPREHENSIVE METABOLIC PANEL (CC13)
ALT: 14 U/L (ref 0–55)
AST: 13 U/L (ref 5–34)
Calcium: 9.5 mg/dL (ref 8.4–10.4)
Chloride: 102 mEq/L (ref 98–107)
Creatinine: 1.1 mg/dL (ref 0.7–1.3)
Potassium: 3.8 mEq/L (ref 3.5–5.1)
Sodium: 139 mEq/L (ref 136–145)
Total Protein: 6.8 g/dL (ref 6.4–8.3)

## 2011-11-13 LAB — CBC WITH DIFFERENTIAL/PLATELET
BASO%: 0.6 % (ref 0.0–2.0)
EOS%: 1.7 % (ref 0.0–7.0)
MCH: 26.3 pg — ABNORMAL LOW (ref 27.2–33.4)
MCHC: 33 g/dL (ref 32.0–36.0)
NEUT%: 72.9 % (ref 39.0–75.0)
RBC: 4.07 10*6/uL — ABNORMAL LOW (ref 4.20–5.82)
RDW: 14 % (ref 11.0–14.6)
lymph#: 0.7 10*3/uL — ABNORMAL LOW (ref 0.9–3.3)

## 2011-11-13 MED ORDER — PACLITAXEL CHEMO INJECTION 300 MG/50ML
45.0000 mg/m2 | Freq: Once | INTRAVENOUS | Status: AC
  Administered 2011-11-13: 78 mg via INTRAVENOUS
  Filled 2011-11-13: qty 13

## 2011-11-13 MED ORDER — DIPHENHYDRAMINE HCL 50 MG/ML IJ SOLN
50.0000 mg | Freq: Once | INTRAMUSCULAR | Status: AC
  Administered 2011-11-13: 50 mg via INTRAVENOUS

## 2011-11-13 MED ORDER — ONDANSETRON 16 MG/50ML IVPB (CHCC)
16.0000 mg | Freq: Once | INTRAVENOUS | Status: AC
  Administered 2011-11-13: 16 mg via INTRAVENOUS

## 2011-11-13 MED ORDER — SODIUM CHLORIDE 0.9 % IV SOLN
142.6000 mg | Freq: Once | INTRAVENOUS | Status: AC
  Administered 2011-11-13: 140 mg via INTRAVENOUS
  Filled 2011-11-13: qty 14

## 2011-11-13 MED ORDER — FAMOTIDINE IN NACL 20-0.9 MG/50ML-% IV SOLN
20.0000 mg | Freq: Once | INTRAVENOUS | Status: AC
  Administered 2011-11-13: 20 mg via INTRAVENOUS

## 2011-11-13 MED ORDER — DEXAMETHASONE SODIUM PHOSPHATE 4 MG/ML IJ SOLN
20.0000 mg | Freq: Once | INTRAMUSCULAR | Status: AC
  Administered 2011-11-13: 20 mg via INTRAVENOUS

## 2011-11-13 NOTE — Patient Instructions (Addendum)
[facility] Discharge Instructions for Patients Receiving Chemotherapy  Today you received the following chemotherapy agents: Taxol, Carboplatin.  To help prevent nausea and vomiting after your treatment, we encourage you to take your nausea medication.   If you develop nausea and vomiting that is not controlled by your nausea medication, call the clinic.   BELOW ARE SYMPTOMS THAT SHOULD BE REPORTED IMMEDIATELY:  *FEVER GREATER THAN 100.5 F  *CHILLS WITH OR WITHOUT FEVER  NAUSEA AND VOMITING THAT IS NOT CONTROLLED WITH YOUR NAUSEA MEDICATION  *UNUSUAL SHORTNESS OF BREATH  *UNUSUAL BRUISING OR BLEEDING  TENDERNESS IN MOUTH AND THROAT WITH OR WITHOUT PRESENCE OF ULCERS  *URINARY PROBLEMS  *BOWEL PROBLEMS  UNUSUAL RASH Items with * indicate a potential emergency and should be followed up as soon as possible.  Feel free to call the clinic you have any questions or concerns. The clinic phone number is [phone].    

## 2011-11-14 ENCOUNTER — Ambulatory Visit
Admission: RE | Admit: 2011-11-14 | Discharge: 2011-11-14 | Disposition: A | Discharge status: Home / Self Care | Payer: Medicare Other | Source: Ambulatory Visit | Attending: Radiation Oncology | Admitting: Radiation Oncology

## 2011-11-14 ENCOUNTER — Telehealth: Payer: Self-pay | Admitting: Medical Oncology

## 2011-11-14 NOTE — Telephone Encounter (Signed)
Wife needed clarification about appt time for chemo and she is surprised at how well he is doing.She does report he has restless legs during chemo in the recliner but not when he is in bed. Pt will talk to provider at next visit.

## 2011-11-15 ENCOUNTER — Ambulatory Visit
Admission: RE | Admit: 2011-11-15 | Discharge: 2011-11-15 | Disposition: A | Discharge status: Home / Self Care | Payer: Medicare Other | Source: Ambulatory Visit | Attending: Radiation Oncology | Admitting: Radiation Oncology

## 2011-11-16 ENCOUNTER — Ambulatory Visit
Admission: RE | Admit: 2011-11-16 | Discharge: 2011-11-16 | Disposition: A | Discharge status: Home / Self Care | Payer: Medicare Other | Source: Ambulatory Visit | Attending: Radiation Oncology | Admitting: Radiation Oncology

## 2011-11-17 ENCOUNTER — Encounter: Payer: Self-pay | Admitting: Radiation Oncology

## 2011-11-17 ENCOUNTER — Telehealth: Payer: Self-pay | Admitting: *Deleted

## 2011-11-17 ENCOUNTER — Ambulatory Visit
Admission: RE | Admit: 2011-11-17 | Discharge: 2011-11-17 | Disposition: A | Discharge status: Home / Self Care | Payer: Medicare Other | Source: Ambulatory Visit | Attending: Radiation Oncology | Admitting: Radiation Oncology

## 2011-11-17 VITALS — BP 142/67 | HR 73 | Temp 97.5°F | Wt 136.2 lb

## 2011-11-17 DIAGNOSIS — E7800 Pure hypercholesterolemia, unspecified: Secondary | ICD-10-CM

## 2011-11-17 DIAGNOSIS — C3490 Malignant neoplasm of unspecified part of unspecified bronchus or lung: Secondary | ICD-10-CM

## 2011-11-17 NOTE — Progress Notes (Signed)
Patient here for weekly assessment of radiation for squamous cell carcinoma of right lung.Denies pain,  shortness of breath or productive cough.Patient does  have generalized fatigue relieved with rest periods.completed 14 of 33 radiation treatments.

## 2011-11-17 NOTE — Progress Notes (Signed)
  Radiation Oncology         [phone] ________________________________  Name: Justin Pittman MRN: [id number]  Date: 11/17/2011  DOB: 1932-11-29  Weekly Radiation Therapy Management  Current Dose: 28 Gy     Planned Dose:  66 Gy  Narrative . . . . . . . . The patient presents for routine under treatment assessment.Patient here for weekly assessment of radiation for squamous cell carcinoma of right lung.Denies pain, shortness of breath or productive cough.Patient does have generalized fatigue relieved with rest periods.completed 14 of 33 radiation treatments.                                                      The patient is without complaint.                                 Set-up films were reviewed.                                 The chart was checked. Physical Findings. . . Weight essentially stable.   Filed Vitals:   11/17/11 1037  BP: 142/67  Pulse: 73  Temp: 97.5 F (36.4 C)   Filed Weights   11/17/11 1037  Weight: 136 lb 3.2 oz (61.78 kg)  No significant changes. Impression . . . . . . . The patient is  tolerating radiation. Plan . . . . . . . . . . . . Continue treatment as planned.  ________________________________  Artist Pais. Kathrynn Running, M.D.

## 2011-11-17 NOTE — Telephone Encounter (Signed)
Patient left note requesting to have cholesterol checked, noting they were never very high and he is not eating as much now. Also asks why his 4 HR infusion was cut to 2.5H on 11/13/11?  After chart review: He is scheduled for lipid panel with his 11/24/11 visit. Initial chemo was 4 HR slot due to 1st time Taxol/Carbo-consecutive treatments do not require 4 hour slot. 9/4: Premed at 1350; Taxol at 1435; Carbo 1610-1640 (3+HR) 9/10: Premed at 1325; Taxol at 1409; Carbo 1520-1550 (2+ HR) 9/16: Premed at 1109; Taxol at 1146; Carbo 1254-1324 (2+ HR) *Time to research this issue for patient approximately 15 minutes*  Called and left voice mail at home # for patient to call back for answers to his questions.

## 2011-11-17 NOTE — Telephone Encounter (Signed)
Reviewed treatment length with wife and pending labs. She verbalizes understanding.

## 2011-11-20 ENCOUNTER — Telehealth: Payer: Self-pay | Admitting: *Deleted

## 2011-11-20 ENCOUNTER — Ambulatory Visit
Admission: RE | Admit: 2011-11-20 | Discharge: 2011-11-20 | Disposition: A | Discharge status: Home / Self Care | Payer: Medicare Other | Source: Ambulatory Visit | Attending: Radiation Oncology | Admitting: Radiation Oncology

## 2011-11-20 NOTE — Telephone Encounter (Signed)
CALLED PATIENT TO INFORM OF NUTRITION APPT. WITH BARABARA NEFF ON 12-01-11 AT 11:15 AM

## 2011-11-21 ENCOUNTER — Ambulatory Visit (HOSPITAL_BASED_OUTPATIENT_CLINIC_OR_DEPARTMENT_OTHER): Payer: Medicare Other | Admitting: Physician Assistant

## 2011-11-21 ENCOUNTER — Other Ambulatory Visit (HOSPITAL_BASED_OUTPATIENT_CLINIC_OR_DEPARTMENT_OTHER): Payer: Medicare Other | Admitting: Lab

## 2011-11-21 ENCOUNTER — Ambulatory Visit
Admission: RE | Admit: 2011-11-21 | Discharge: 2011-11-21 | Disposition: A | Discharge status: Home / Self Care | Payer: Medicare Other | Source: Ambulatory Visit | Attending: Radiation Oncology | Admitting: Radiation Oncology

## 2011-11-21 ENCOUNTER — Ambulatory Visit (HOSPITAL_BASED_OUTPATIENT_CLINIC_OR_DEPARTMENT_OTHER): Payer: Medicare Other

## 2011-11-21 ENCOUNTER — Encounter: Payer: Self-pay | Admitting: Physician Assistant

## 2011-11-21 ENCOUNTER — Ambulatory Visit: Payer: Medicare Other | Admitting: Physician Assistant

## 2011-11-21 ENCOUNTER — Telehealth: Payer: Self-pay | Admitting: *Deleted

## 2011-11-21 ENCOUNTER — Telehealth: Payer: Self-pay | Admitting: Internal Medicine

## 2011-11-21 VITALS — BP 106/64 | HR 91 | Temp 97.5°F | Resp 20 | Ht 70.0 in | Wt 135.2 lb

## 2011-11-21 DIAGNOSIS — C7970 Secondary malignant neoplasm of unspecified adrenal gland: Secondary | ICD-10-CM

## 2011-11-21 DIAGNOSIS — G2581 Restless legs syndrome: Secondary | ICD-10-CM

## 2011-11-21 DIAGNOSIS — C3490 Malignant neoplasm of unspecified part of unspecified bronchus or lung: Secondary | ICD-10-CM

## 2011-11-21 DIAGNOSIS — Z5111 Encounter for antineoplastic chemotherapy: Secondary | ICD-10-CM

## 2011-11-21 DIAGNOSIS — C3430 Malignant neoplasm of lower lobe, unspecified bronchus or lung: Secondary | ICD-10-CM

## 2011-11-21 LAB — COMPREHENSIVE METABOLIC PANEL (CC13)
Albumin: 3.4 g/dL — ABNORMAL LOW (ref 3.5–5.0)
CO2: 22 mEq/L (ref 22–29)
Chloride: 103 mEq/L (ref 98–107)
Glucose: 166 mg/dl — ABNORMAL HIGH (ref 70–99)
Potassium: 4.8 mEq/L (ref 3.5–5.1)
Sodium: 137 mEq/L (ref 136–145)
Total Protein: 7 g/dL (ref 6.4–8.3)

## 2011-11-21 LAB — CBC WITH DIFFERENTIAL/PLATELET
Basophils Absolute: 0 10*3/uL (ref 0.0–0.1)
Eosinophils Absolute: 0 10*3/uL (ref 0.0–0.5)
HGB: 11.1 g/dL — ABNORMAL LOW (ref 13.0–17.1)
MCV: 80.4 fL (ref 79.3–98.0)
NEUT#: 2.8 10*3/uL (ref 1.5–6.5)
RDW: 14.4 % (ref 11.0–14.6)
lymph#: 0.7 10*3/uL — ABNORMAL LOW (ref 0.9–3.3)

## 2011-11-21 MED ORDER — SODIUM CHLORIDE 0.9 % IV SOLN
Freq: Once | INTRAVENOUS | Status: AC
  Administered 2011-11-21: 13:00:00 via INTRAVENOUS

## 2011-11-21 MED ORDER — DIPHENHYDRAMINE HCL 50 MG/ML IJ SOLN
25.0000 mg | Freq: Once | INTRAMUSCULAR | Status: AC
  Administered 2011-11-21: 25 mg via INTRAVENOUS

## 2011-11-21 MED ORDER — SODIUM CHLORIDE 0.9 % IV SOLN
142.6000 mg | Freq: Once | INTRAVENOUS | Status: AC
  Administered 2011-11-21: 140 mg via INTRAVENOUS
  Filled 2011-11-21: qty 14

## 2011-11-21 MED ORDER — PACLITAXEL CHEMO INJECTION 300 MG/50ML
45.0000 mg/m2 | Freq: Once | INTRAVENOUS | Status: AC
  Administered 2011-11-21: 78 mg via INTRAVENOUS
  Filled 2011-11-21: qty 13

## 2011-11-21 MED ORDER — DEXAMETHASONE SODIUM PHOSPHATE 4 MG/ML IJ SOLN
20.0000 mg | Freq: Once | INTRAMUSCULAR | Status: AC
  Administered 2011-11-21: 20 mg via INTRAVENOUS

## 2011-11-21 MED ORDER — FAMOTIDINE IN NACL 20-0.9 MG/50ML-% IV SOLN
20.0000 mg | Freq: Once | INTRAVENOUS | Status: AC
  Administered 2011-11-21: 20 mg via INTRAVENOUS

## 2011-11-21 MED ORDER — ONDANSETRON 16 MG/50ML IVPB (CHCC)
16.0000 mg | Freq: Once | INTRAVENOUS | Status: AC
  Administered 2011-11-21: 16 mg via INTRAVENOUS

## 2011-11-21 NOTE — Patient Instructions (Addendum)
[facility] Discharge Instructions for Patients Receiving Chemotherapy  Today you received the following chemotherapy agents: taxol, carbo   To help prevent nausea and vomiting after your treatment, we encourage you to take your nausea medication.  Take it as often as prescribed.     If you develop nausea and vomiting that is not controlled by your nausea medication, call the clinic. If it is after clinic hours your family physician or the after hours number for the clinic or go to the Emergency Department.   BELOW ARE SYMPTOMS THAT SHOULD BE REPORTED IMMEDIATELY:  *FEVER GREATER THAN 100.5 F  *CHILLS WITH OR WITHOUT FEVER  NAUSEA AND VOMITING THAT IS NOT CONTROLLED WITH YOUR NAUSEA MEDICATION  *UNUSUAL SHORTNESS OF BREATH  *UNUSUAL BRUISING OR BLEEDING  TENDERNESS IN MOUTH AND THROAT WITH OR WITHOUT PRESENCE OF ULCERS  *URINARY PROBLEMS  *BOWEL PROBLEMS  UNUSUAL RASH Items with * indicate a potential emergency and should be followed up as soon as possible.  Feel free to call the clinic you have any questions or concerns. The clinic phone number is [phone].   I have been informed and understand all the instructions given to me. I know to contact the clinic, my physician, or go to the Emergency Department if any problems should occur. I do not have any questions at this time, but understand that I may call the clinic during office hours   should I have any questions or need assistance in obtaining follow up care.    __________________________________________  _____________  __________ Signature of Patient or Authorized Representative            Date                   Time    __________________________________________ Nurse's Signature    

## 2011-11-21 NOTE — Telephone Encounter (Signed)
Per staff message and POF I have scheduled appt./  JWM  

## 2011-11-21 NOTE — Progress Notes (Signed)
Pt in nursing prior to radiation tx for VS per wife's request. BP, HR slightly elevated. Pt denies pain, does report fatigue but verbalized no other c/o. Walked w/pt to tomo tx area.

## 2011-11-21 NOTE — Patient Instructions (Addendum)
Continue course of concurrent chemotherapy and radiation Try eating several small meals through out the day Follow up in 3 weeks or sooner if needed

## 2011-11-21 NOTE — Telephone Encounter (Signed)
appts made and printed for pt aom °

## 2011-11-22 ENCOUNTER — Ambulatory Visit
Admission: RE | Admit: 2011-11-22 | Discharge: 2011-11-22 | Disposition: A | Discharge status: Home / Self Care | Payer: Medicare Other | Source: Ambulatory Visit | Attending: Radiation Oncology | Admitting: Radiation Oncology

## 2011-11-22 NOTE — Progress Notes (Signed)
[facility] [phone]   [phone]  OFFICE PROGRESS NOTE  [name], MD [address] [facility], [place]. [place] [address]  DIAGNOSIS: Stage IIIB non-small cell lung cancer, squamous cell carcinoma with right lower lobe lung mass as well as left hilar lymphadenopathy diagnosed in August of 2013.  PRIOR THERAPY: None  CURRENT THERAPY: Concurrent chemoradiation with weekly carboplatin for AUC of 2 and paclitaxel 45 mg/M2.  INTERVAL HISTORY: Justin Pittman 78 y.o. male returns to the clinic today for followup visit accompanied by his son. He is having difficulty with the 50 mg of Benadryl given as a premed with his chemotherapy. Specifically he is having restless legs that he finds uncomfortable. He is otherwise tolerating his chemotherapy concurrent with radiation therapy relatively well. He does report not having any appetite and complains of being tired and feeling weak. He currently is scheduled to have radiation therapy through 12/14/2011. T He denied having any significant chest pain but continues to have shortness breath with exertion, no cough or hemoptysis. He has no significant weight loss or night sweats.   MEDICAL HISTORY: Past Medical History  Diagnosis Date  . HTN (hypertension)   . Lung cancer     Squamous Cell  . Vertigo 2010    Hospitalized a [facility]  . Hyperlipidemia     ALLERGIES:   has no known allergies.  MEDICATIONS:  Current Outpatient Prescriptions  Medication Sig Dispense Refill  . aspirin 81 MG tablet Take 81 mg by mouth daily.      Marland Kitchen atorvastatin (LIPITOR) 20 MG tablet Take 20 mg by mouth daily with breakfast.       . chlorthalidone (HYGROTON) 25 MG tablet Take 0.5 tablets by mouth daily with breakfast.       . famotidine (PEPCID) 20 MG tablet Take 20 mg by mouth at bedtime.      Marland Kitchen KLOR-CON M10 10 MEQ tablet Take 1 tablet by mouth Daily.      . nebivolol (BYSTOLIC) 5 MG tablet Take 0.5  tablets (2.5 mg total) by mouth daily with breakfast.  30 tablet  5  . omeprazole (PRILOSEC) 20 MG capsule Take 20 mg by mouth daily before breakfast.      . prochlorperazine (COMPAZINE) 10 MG tablet Take 10 mg by mouth every 6 (six) hours as needed.      . sucralfate (CARAFATE) 1 G tablet Take 1 g by mouth 4 (four) times daily. Dissolve 1 tab. In water qid ac 5 min. Before food prn discomfort.Disp. # 60 w/ 3 refills Called into [facility] on [address].      . Wound Cleansers (RADIAPLEX EX) Apply topically.       No current facility-administered medications for this visit.   Facility-Administered Medications Ordered in Other Visits  Medication Dose Route Frequency Provider Last Rate Last Dose  . 0.9 %  sodium chloride infusion   Intravenous Once Si Gaul, MD      . CARBOplatin (PARAPLATIN) 140 mg in sodium chloride 0.9 % 100 mL chemo infusion  140 mg Intravenous Once Si Gaul, MD   140 mg at 11/21/11 1446  . dexamethasone (DECADRON) injection 20 mg  20 mg Intravenous Once Si Gaul, MD   20 mg at 11/21/11 1301  . diphenhydrAMINE (BENADRYL) injection 25 mg  25 mg Intravenous Once [employer], PA   25 mg at 11/21/11 1301  . famotidine (PEPCID) IVPB 20 mg  20 mg Intravenous Once [facility],  MD   20 mg at 11/21/11 1325  . ondansetron (ZOFRAN) IVPB 16 mg  16 mg Intravenous Once Si Gaul, MD   16 mg at 11/21/11 1301  . PACLitaxel (TAXOL) 78 mg in dextrose 5 % 250 mL chemo infusion (</= 80mg /m2)  45 mg/m2 (Treatment Plan Actual) Intravenous Once Si Gaul, MD   78 mg at 11/21/11 1338    SURGICAL HISTORY:  Past Surgical History  Procedure Date  . Tonsillectomy   . Video bronchoscopy 09/20/2011    Procedure: VIDEO BRONCHOSCOPY WITHOUT FLUORO;  Surgeon: Nyoka Cowden, MD;  Location: [facility];  Service: Cardiopulmonary;  Laterality: Bilateral;    REVIEW OF SYSTEMS:  A comprehensive review of systems was negative except for: Constitutional: positive  for anorexia and fatigue Respiratory: positive for dyspnea on exertion Neurological: positive for Restless legs associated with the Benadryl premedication.   PHYSICAL EXAMINATION: General appearance: alert, cooperative and no distress Head: Normocephalic, without obvious abnormality, atraumatic Neck: no adenopathy Lymph nodes: Cervical, supraclavicular, and axillary nodes normal. Resp: clear to auscultation bilaterally Cardio: regular rate and rhythm, S1, S2 normal, no murmur, click, rub or gallop GI: soft, non-tender; bowel sounds normal; no masses,  no organomegaly Extremities: extremities normal, atraumatic, no cyanosis or edema Neurologic: Alert and oriented X 3, normal strength and tone. Normal symmetric reflexes. Normal coordination and gait  ECOG PERFORMANCE STATUS: 1 - Symptomatic but completely ambulatory  Blood pressure 106/64, pulse 91, temperature 97.5 F (36.4 C), temperature source Oral, resp. rate 20, height 5\' 10"  (1.778 m), weight 135 lb 3.2 oz (61.326 kg).  LABORATORY DATA: Lab Results  Component Value Date   WBC 4.2 11/21/2011   HGB 11.1* 11/21/2011   HCT 33.2* 11/21/2011   MCV 80.4 11/21/2011   PLT 186 11/21/2011      Chemistry      Component Value Date/Time   NA 137 11/21/2011 1045   NA 138 08/17/2011 0936   K 4.8 11/21/2011 1045   K 4.3 08/17/2011 0936   CL 103 11/21/2011 1045   CL 97 08/17/2011 0936   CO2 22 11/21/2011 1045   CO2 30 08/17/2011 0936   BUN 19.0 11/21/2011 1045   BUN 21 08/17/2011 0936   CREATININE 1.0 11/21/2011 1045   CREATININE 1.2 08/17/2011 0936      Component Value Date/Time   CALCIUM 9.1 11/21/2011 1045   CALCIUM 9.5 08/17/2011 0936   ALKPHOS 171* 11/21/2011 1045   ALKPHOS 87 10/27/2008 2016   AST 17 11/21/2011 1045   AST 20 10/27/2008 2016   ALT 13 11/21/2011 1045   ALT 20 10/27/2008 2016   BILITOT 0.60 11/21/2011 1045   BILITOT 0.9 10/27/2008 2016       RADIOGRAPHIC STUDIES: Dg Chest 1 View  10/11/2011  *RADIOLOGY REPORT*  Clinical  Data: Post right lung biopsy.  CHEST - 1 VIEW  Comparison: 10/06/2011  Findings: Right perihilar mass noted.  No pneumothorax following lung biopsy.  Mild hyperinflation.  Heart is normal size.  No acute infiltrates or effusions.  IMPRESSION: No pneumothorax following right lung biopsy.  Original Report Authenticated By: Cyndie Chime, M.D.   Mr Justin Pittman Contrast  10/26/2011  *RADIOLOGY REPORT*  Clinical Data: Squamous cell lung cancer.  Rule out metastatic disease  MRI HEAD WITHOUT AND WITH CONTRAST  Technique:  Multiplanar, multiecho pulse sequences of the brain and surrounding structures were obtained according to standard protocol without and with intravenous contrast  Contrast: 13mL MULTIHANCE GADOBENATE DIMEGLUMINE 529 MG/ML IV SOLN  Comparison: CT 10/27/2008  Findings: Mild atrophy.  Multiple small nonenhancing white matter hyperintensities in the cerebral white matter bilaterally are most likely related to chronic microvascular ischemia.  No acute infarct.  Negative for hemorrhage or mass lesion.  Negative for hydrocephalus.  Following contrast infusion, no enhancing lesions are identified in the brain.  Air-fluid level right maxillary sinus compatible with sinusitis. Mucosal edema in the frontal and ethmoid sinuses.  IMPRESSION: Negative for metastatic disease to the brain.  Sinusitis with air-fluid level right maxillary sinus.  Atrophy and chronic microvascular ischemic changes.   Original Report Authenticated By: Camelia Phenes, M.D.    Ct Biopsy  10/11/2011  *RADIOLOGY REPORT*  CT GUIDED LUNG BIOPSY  Date: 10/11/2011  Clinical History: 78 year old male with a mass in the superior segment of the right lower lobe.  He underwent endoscopic biopsy with a nondiagnostic result and now presents for CT-guided biopsy to further facilitate tissue diagnosis.  Procedures Performed: 1. CT guided biopsy of mass in the superior segment of the right lower lobe  Interventional Radiologist:  Sterling Big,  MD  Sedation: Moderate (conscious) sedation was used.  One mg Versed, 50 mcg Fentanyl were administered intravenously.  The patient's vital signs were monitored continuously by radiology nursing throughout the procedure.  Sedation Time: 30 minutes  PROCEDURE/FINDINGS:   Informed consent was obtained from the patient following explanation of the procedure, risks, benefits and alternatives. The patient understands, agrees and consents for the procedure. All questions were addressed. A time out was performed.  Maximal barrier sterile technique utilized including caps, mask, sterile gowns, sterile gloves, large sterile drape, hand hygiene, and betadine skin prep.  A planning axial CT scan was obtained.  The right infrahilar mass in the superior segment of the lower lobe was identified.  A suitable skin entry site was selected and marked.  Local anesthesia was achieved with infiltration of 1% lidocaine.  Using CT fluoroscopic guidance, a 17 gauge trocar needle was advanced through the skin, underlying soft tissues and pleura into the lung. The needle tip was then positioned just within the mass.  Given the history of a prior nondiagnostic biopsy, multiple fine needle aspirates were obtained and sent for on site cytopathologic evaluation.  Cytopathology confirmed adequate sampling.  Therefore, several 18-gauge core biopsies were obtained to facilitate further immunohistochemical and genetic testing.  The needle was removed.  A post biopsy axial CT scan was performed which demonstrated minimal hemorrhage around the mass, and no evidence of pneumothorax.  The patient tolerated the procedure well, there is no immediate complication.  After an appropriate observation time, the patient was discharged home in stable condition.  IMPRESSION:  Technically successful CT guided fine needle aspiration and core biopsy of pulmonary mass in the superior segment of the right lower lobe.  Signed,  Sterling Big, MD Vascular &  Interventional Radiologist [facility]  Original Report Authenticated By: Vilma Prader    ASSESSMENT/PLAN: This is a very pleasant 78 years old white male with stage IIIB non-small cell lung cancer, currently undergoing concurrent chemoradiation with weekly carboplatin and paclitaxel.The patient is doing fine and tolerating his treatment fairly well with the exception of some restless legs related to the Benadryl premedication. The patient was discussed with Dr. Arbutus Ped. He will continue with his course of concurrent chemoradiation as scheduled. He is encouraged to eat several small meals throughout the day. We will decrease the Benadryl to 25 mg to see if this will decrease the incidence of his restless legs.  He will followup in 3 weeks with repeat CBC differential and C. met. He may return sooner if needed for evaluation and management of any concerning symptoms.  Laural Benes, Brylie Sneath E, PA-C   All questions were answered. The patient knows to call the clinic with any problems, questions or concerns. We can certainly see the patient much sooner if necessary.  I spent 20 minutes counseling the patient face to face. The total time spent in the appointment was 30 minutes.

## 2011-11-23 ENCOUNTER — Ambulatory Visit
Admission: RE | Admit: 2011-11-23 | Discharge: 2011-11-23 | Disposition: A | Discharge status: Home / Self Care | Payer: Medicare Other | Source: Ambulatory Visit | Attending: Radiation Oncology | Admitting: Radiation Oncology

## 2011-11-24 ENCOUNTER — Ambulatory Visit: Payer: Medicare Other

## 2011-11-27 ENCOUNTER — Encounter: Payer: Self-pay | Admitting: Radiation Oncology

## 2011-11-27 ENCOUNTER — Ambulatory Visit
Admission: RE | Admit: 2011-11-27 | Discharge: 2011-11-27 | Disposition: A | Discharge status: Home / Self Care | Payer: Medicare Other | Source: Ambulatory Visit | Attending: Radiation Oncology | Admitting: Radiation Oncology

## 2011-11-27 VITALS — BP 129/78 | HR 81 | Temp 97.6°F | Resp 20 | Wt 134.1 lb

## 2011-11-27 DIAGNOSIS — C3490 Malignant neoplasm of unspecified part of unspecified bronchus or lung: Secondary | ICD-10-CM

## 2011-11-27 NOTE — Progress Notes (Signed)
Pt for PUT today, was not tx nor seen last Fri. Pt's son w/pt. Son states pt "was out in the wind Sat and afterwards his wife thought he was wheezing". Pt's lungs clear today, O2 sat 100%. Pt states he has dry cough, worse in mornings. Pt sleeps w/head on two pillows, will often sleep sitting up if he is awakened w/cough. Pt has not taken any OTC for cough. He denies pain, sob, is fatigued, some loss of appetite for certain foods.

## 2011-11-27 NOTE — Progress Notes (Signed)
  Radiation Oncology         [phone] ________________________________  Name: Justin Pittman MRN: [id number]  Date: 11/27/2011  DOB: 25-Mar-1932  Weekly Radiation Therapy Management  Current Dose: 38 Gy     Planned Dose:  66 Gy  Narrative . . . . . . . . The patient presents for routine under treatment assessment.                                                     The patient is without complaint.                                 Set-up films were reviewed.                                 The chart was checked. Physical Findings. . . Weight essentially stable.  No significant changes.  Lungs clear. Impression . . . . . . . The patient is  tolerating radiation. Plan . . . . . . . . . . . . Continue treatment as planned.  ________________________________  Artist Pais. Kathrynn Running, M.D.

## 2011-11-28 ENCOUNTER — Ambulatory Visit
Admission: RE | Admit: 2011-11-28 | Discharge: 2011-11-28 | Disposition: A | Discharge status: Home / Self Care | Payer: Medicare Other | Source: Ambulatory Visit | Attending: Radiation Oncology | Admitting: Radiation Oncology

## 2011-11-28 ENCOUNTER — Other Ambulatory Visit (HOSPITAL_BASED_OUTPATIENT_CLINIC_OR_DEPARTMENT_OTHER): Payer: Medicare Other | Admitting: Lab

## 2011-11-28 ENCOUNTER — Ambulatory Visit (HOSPITAL_BASED_OUTPATIENT_CLINIC_OR_DEPARTMENT_OTHER): Payer: Medicare Other

## 2011-11-28 ENCOUNTER — Other Ambulatory Visit: Payer: Self-pay | Admitting: Internal Medicine

## 2011-11-28 VITALS — BP 162/83 | HR 97 | Temp 97.6°F | Resp 20

## 2011-11-28 DIAGNOSIS — C7970 Secondary malignant neoplasm of unspecified adrenal gland: Secondary | ICD-10-CM

## 2011-11-28 DIAGNOSIS — C3430 Malignant neoplasm of lower lobe, unspecified bronchus or lung: Secondary | ICD-10-CM

## 2011-11-28 DIAGNOSIS — Z5111 Encounter for antineoplastic chemotherapy: Secondary | ICD-10-CM

## 2011-11-28 DIAGNOSIS — C3490 Malignant neoplasm of unspecified part of unspecified bronchus or lung: Secondary | ICD-10-CM

## 2011-11-28 LAB — CBC WITH DIFFERENTIAL/PLATELET
Basophils Absolute: 0 10*3/uL (ref 0.0–0.1)
EOS%: 0.9 % (ref 0.0–7.0)
Eosinophils Absolute: 0 10*3/uL (ref 0.0–0.5)
LYMPH%: 10.1 % — ABNORMAL LOW (ref 14.0–49.0)
MCH: 26.8 pg — ABNORMAL LOW (ref 27.2–33.4)
MCV: 79.3 fL (ref 79.3–98.0)
MONO%: 16.4 % — ABNORMAL HIGH (ref 0.0–14.0)
NEUT#: 2.3 10*3/uL (ref 1.5–6.5)
Platelets: 124 10*3/uL — ABNORMAL LOW (ref 140–400)
RBC: 3.77 10*6/uL — ABNORMAL LOW (ref 4.20–5.82)
nRBC: 0 % (ref 0–0)

## 2011-11-28 LAB — COMPREHENSIVE METABOLIC PANEL (CC13)
CO2: 25 mEq/L (ref 22–29)
Creatinine: 1.1 mg/dL (ref 0.7–1.3)
Glucose: 106 mg/dl — ABNORMAL HIGH (ref 70–99)
Total Bilirubin: 0.6 mg/dL (ref 0.20–1.20)

## 2011-11-28 MED ORDER — DEXAMETHASONE SODIUM PHOSPHATE 4 MG/ML IJ SOLN
20.0000 mg | Freq: Once | INTRAMUSCULAR | Status: AC
  Administered 2011-11-28: 20 mg via INTRAVENOUS

## 2011-11-28 MED ORDER — FAMOTIDINE IN NACL 20-0.9 MG/50ML-% IV SOLN
20.0000 mg | Freq: Once | INTRAVENOUS | Status: AC
  Administered 2011-11-28: 20 mg via INTRAVENOUS

## 2011-11-28 MED ORDER — SODIUM CHLORIDE 0.9 % IV SOLN
Freq: Once | INTRAVENOUS | Status: AC
  Administered 2011-11-28: 11:00:00 via INTRAVENOUS

## 2011-11-28 MED ORDER — PACLITAXEL CHEMO INJECTION 300 MG/50ML
45.0000 mg/m2 | Freq: Once | INTRAVENOUS | Status: AC
  Administered 2011-11-28: 78 mg via INTRAVENOUS
  Filled 2011-11-28: qty 13

## 2011-11-28 MED ORDER — SODIUM CHLORIDE 0.9 % IV SOLN
142.6000 mg | Freq: Once | INTRAVENOUS | Status: AC
  Administered 2011-11-28: 140 mg via INTRAVENOUS
  Filled 2011-11-28: qty 14

## 2011-11-28 MED ORDER — DIPHENHYDRAMINE HCL 50 MG/ML IJ SOLN
25.0000 mg | Freq: Once | INTRAMUSCULAR | Status: AC
  Administered 2011-11-28: 25 mg via INTRAVENOUS

## 2011-11-28 MED ORDER — ONDANSETRON 16 MG/50ML IVPB (CHCC)
16.0000 mg | Freq: Once | INTRAVENOUS | Status: AC
  Administered 2011-11-28: 16 mg via INTRAVENOUS

## 2011-11-28 NOTE — Progress Notes (Signed)
Okay per Dr. Arbutus Ped to treat today despite WBC 3.2-dhp, rn

## 2011-11-28 NOTE — Patient Instructions (Addendum)
[facility] Discharge Instructions for Patients Receiving Chemotherapy  Today you received the following chemotherapy agents Taxol and Carboplatin.  To help prevent nausea and vomiting after your treatment, we encourage you to take your nausea medication as prescribed.   If you develop nausea and vomiting that is not controlled by your nausea medication, call the clinic. If it is after clinic hours your family physician or the after hours number for the clinic or go to the Emergency Department.   BELOW ARE SYMPTOMS THAT SHOULD BE REPORTED IMMEDIATELY:  *FEVER GREATER THAN 100.5 F  *CHILLS WITH OR WITHOUT FEVER  NAUSEA AND VOMITING THAT IS NOT CONTROLLED WITH YOUR NAUSEA MEDICATION  *UNUSUAL SHORTNESS OF BREATH  *UNUSUAL BRUISING OR BLEEDING  TENDERNESS IN MOUTH AND THROAT WITH OR WITHOUT PRESENCE OF ULCERS  *URINARY PROBLEMS  *BOWEL PROBLEMS  UNUSUAL RASH Items with * indicate a potential emergency and should be followed up as soon as possible.  One of the nurses will contact you 24 hours after your treatment. Please let the nurse know about any problems that you may have experienced. Feel free to call the clinic you have any questions or concerns. The clinic phone number is [phone].   I have been informed and understand all the instructions given to me. I know to contact the clinic, my physician, or go to the Emergency Department if any problems should occur. I do not have any questions at this time, but understand that I may call the clinic during office hours   should I have any questions or need assistance in obtaining follow up care.    __________________________________________  _____________  __________ Signature of Patient or Authorized Representative            Date                   Time    __________________________________________ Nurse's Signature    

## 2011-11-29 ENCOUNTER — Ambulatory Visit
Admission: RE | Admit: 2011-11-29 | Discharge: 2011-11-29 | Disposition: A | Discharge status: Home / Self Care | Payer: Medicare Other | Source: Ambulatory Visit | Attending: Radiation Oncology | Admitting: Radiation Oncology

## 2011-11-30 ENCOUNTER — Ambulatory Visit
Admission: RE | Admit: 2011-11-30 | Discharge: 2011-11-30 | Disposition: A | Discharge status: Home / Self Care | Payer: Medicare Other | Source: Ambulatory Visit | Attending: Radiation Oncology | Admitting: Radiation Oncology

## 2011-12-01 ENCOUNTER — Encounter: Payer: Self-pay | Admitting: Radiation Oncology

## 2011-12-01 ENCOUNTER — Ambulatory Visit: Payer: Medicare Other | Admitting: Nutrition

## 2011-12-01 ENCOUNTER — Ambulatory Visit
Admission: RE | Admit: 2011-12-01 | Discharge: 2011-12-01 | Disposition: A | Discharge status: Home / Self Care | Payer: Medicare Other | Source: Ambulatory Visit | Attending: Radiation Oncology | Admitting: Radiation Oncology

## 2011-12-01 VITALS — BP 138/81 | HR 91 | Temp 97.5°F | Resp 20 | Wt 133.7 lb

## 2011-12-01 DIAGNOSIS — C3490 Malignant neoplasm of unspecified part of unspecified bronchus or lung: Secondary | ICD-10-CM

## 2011-12-01 MED ORDER — HYDROCODONE-ACETAMINOPHEN 7.5-500 MG/15ML PO SOLN: 10.0000 mL | Freq: Four times a day (QID) | ORAL | Status: DC | PRN

## 2011-12-01 NOTE — Assessment & Plan Note (Signed)
Justin Pittman is a 78 year old male patient of Dr. Asa Lente diagnosed with lung cancer receiving concurrent chemoradiation.    Past medical history includes hypertension and hyperlipidemia.    Medications include Lipitor, Pepcid, Prilosec, Compazine and Carafate.    Labs include a glucose of 106.    Height 5 feet 10 inches, weight 133.7 pounds, usual body weight 156 pounds, BMI 19.18.    The patient reports decreased appetite and decreased oral intake.  He likes fruit.  He reports he does not eat much meat.  He drinks 1 Ensure a day, providing 250 calories and 9 g of protein.  The patient denies other nutrition side effects.  NUTRITION DIAGNOSIS:  Unintended weight loss related to diagnosis of lung cancer and associated treatments as evidenced by 14% weight loss from usual body weight.  INTERVENTION:  I educated the patient and wife to attempt to increase calories and protein throughout the day in 6-8 small meals and snacks. I have encouraged the patient to increase Ensure to b.i.d. between meals.  I have discussed the importance of eating proteins and carbohydrates first at his meals and then eating fruits and vegetables. He is also to drink increased fluids after he eats a meal.  I provided fact sheets for the patient to take with him today and reviewed specific snack choices the patient could have available.  The patient able to teach back snacks that he is willing to incorporate.  MONITORING/EVALUATION (GOALS):  The patient will tolerate increased oral intake to maintain weight and minimize further weight loss.  NEXT VISIT:  Tuesday, 10/15 during chemotherapy.    ______________________________ Zenovia Jarred, RD, CSO, LDN Clinical Nutrition Specialist BN/MEDQ  D:  12/01/2011  T:  12/01/2011  Job:  [id number]

## 2011-12-01 NOTE — Progress Notes (Signed)
Pt denies pain, does report fatigue, loss of appetite, dry cough at night. Pt has not tried any OTC for cough; wife requesting med for sleep. Pt has not tried OTC for sleep. Pt c/o constipation; taking stool softeners w/little relief. Discussed adding fiber OTC such as Miralax, drinking more liquids. Also discussed need for protein in diet.

## 2011-12-01 NOTE — Progress Notes (Signed)
  Radiation Oncology         [phone] ________________________________  Name: Justin Pittman MRN: [id number]  Date: 12/01/2011  DOB: January 08, 1933  Weekly Radiation Therapy Management  Current Dose: 46 Gy     Planned Dose:  66 Gy  Narrative . . . . . . . . The patient presents for routine under treatment assessment.                                                     Pt denies pain, does report fatigue, loss of appetite, dry cough at night. Pt has not tried any OTC for cough; wife requesting med for sleep. Pt has not tried OTC for sleep. Pt c/o constipation; taking stool softeners w/little relief. Discussed adding fiber OTC such as Miralax, drinking more liquids.  Also discussed need for protein in diet                                 Set-up films were reviewed.                                 The chart was checked. Physical Findings. . .  weight is 133 lb 11.2 oz (60.646 kg). His oral temperature is 97.5 F (36.4 C). His blood pressure is 138/81 and his pulse is 91. His respiration is 20 and oxygen saturation is 99%. . Weight essentially stable.  No significant changes. Impression . . . . . . . The patient is  tolerating radiation. Plan . . . . . . . . . . . . Continue treatment as planned.  Given Lortab for pain/cough  ________________________________  Artist Pais. Kathrynn Running, M.D.

## 2011-12-04 ENCOUNTER — Encounter: Payer: Self-pay | Admitting: *Deleted

## 2011-12-04 ENCOUNTER — Ambulatory Visit
Admission: RE | Admit: 2011-12-04 | Discharge: 2011-12-04 | Disposition: A | Discharge status: Home / Self Care | Payer: Medicare Other | Source: Ambulatory Visit | Attending: Radiation Oncology | Admitting: Radiation Oncology

## 2011-12-04 ENCOUNTER — Other Ambulatory Visit: Payer: Self-pay | Admitting: *Deleted

## 2011-12-04 ENCOUNTER — Telehealth: Payer: Self-pay | Admitting: *Deleted

## 2011-12-04 DIAGNOSIS — C3490 Malignant neoplasm of unspecified part of unspecified bronchus or lung: Secondary | ICD-10-CM

## 2011-12-04 MED ORDER — METHYLPREDNISOLONE 4 MG PO KIT: PACK | ORAL | Status: DC

## 2011-12-04 NOTE — Progress Notes (Signed)
Son brought pt to nursing prior to radiation tx today requesting BP check. He states pt "sat right down in the restaurant this morning instead of standing in line to order". Son states "that was unusual for him to do." Pt ate good breakfast, "his usual". Pt's VS WNL. Pt denies pain, n/v. Pt states he "feels fine". Informed pt and son that chemotherapy, radiation will cause weakness and fatigue. Strongly advised pt eat protein, carb snack prior to bedtime. Pt states he occasionally does, ate donut last night. Pt saw nutritionist last week; states she "told him what he needs to eat". Pt states his wife is "going to call Dr Asa Lente nurse today to ask about something for his appetite". Re-educated pt and son on importance of protein, carbs in his diet, enc snacks often. Also reminded pt he will see Dr Kathrynn Running Caleen Essex and can discuss w/him as well. Pt and son verbalized understanding, agreement. This RN walked w/pt back to [place] tx area.

## 2011-12-04 NOTE — Telephone Encounter (Signed)
Pt's wife called stating that pt has been c/o dizziness the past few days, and gets light headed when he gets up from a sitting position.  Pt has no been eating and drinking well.  She feels he may be dehydrated.  Per Dr Donnald Garre, okay to give pt medrol dose pack and IVF with his chemotherapy tomorrow.  Pt's wife aware.

## 2011-12-05 ENCOUNTER — Other Ambulatory Visit: Payer: Self-pay | Admitting: Internal Medicine

## 2011-12-05 ENCOUNTER — Ambulatory Visit (HOSPITAL_BASED_OUTPATIENT_CLINIC_OR_DEPARTMENT_OTHER): Payer: Medicare Other

## 2011-12-05 ENCOUNTER — Other Ambulatory Visit (HOSPITAL_BASED_OUTPATIENT_CLINIC_OR_DEPARTMENT_OTHER): Payer: Medicare Other | Admitting: Lab

## 2011-12-05 ENCOUNTER — Ambulatory Visit
Admission: RE | Admit: 2011-12-05 | Discharge: 2011-12-05 | Disposition: A | Discharge status: Home / Self Care | Payer: Medicare Other | Source: Ambulatory Visit | Attending: Radiation Oncology | Admitting: Radiation Oncology

## 2011-12-05 VITALS — BP 145/78 | HR 83 | Temp 97.3°F | Resp 18

## 2011-12-05 DIAGNOSIS — C3430 Malignant neoplasm of lower lobe, unspecified bronchus or lung: Secondary | ICD-10-CM

## 2011-12-05 DIAGNOSIS — C3490 Malignant neoplasm of unspecified part of unspecified bronchus or lung: Secondary | ICD-10-CM

## 2011-12-05 DIAGNOSIS — Z5111 Encounter for antineoplastic chemotherapy: Secondary | ICD-10-CM

## 2011-12-05 DIAGNOSIS — C7970 Secondary malignant neoplasm of unspecified adrenal gland: Secondary | ICD-10-CM

## 2011-12-05 LAB — COMPREHENSIVE METABOLIC PANEL (CC13)
ALT: 11 U/L (ref 0–55)
AST: 14 U/L (ref 5–34)
Alkaline Phosphatase: 152 U/L — ABNORMAL HIGH (ref 40–150)
Sodium: 136 mEq/L (ref 136–145)
Total Bilirubin: 0.7 mg/dL (ref 0.20–1.20)
Total Protein: 6.4 g/dL (ref 6.4–8.3)

## 2011-12-05 LAB — CBC WITH DIFFERENTIAL/PLATELET
Basophils Absolute: 0 10*3/uL (ref 0.0–0.1)
Eosinophils Absolute: 0 10*3/uL (ref 0.0–0.5)
HGB: 9.9 g/dL — ABNORMAL LOW (ref 13.0–17.1)
NEUT#: 1.4 10*3/uL — ABNORMAL LOW (ref 1.5–6.5)
RDW: 15.1 % — ABNORMAL HIGH (ref 11.0–14.6)
lymph#: 0.4 10*3/uL — ABNORMAL LOW (ref 0.9–3.3)

## 2011-12-05 MED ORDER — FAMOTIDINE IN NACL 20-0.9 MG/50ML-% IV SOLN
20.0000 mg | Freq: Once | INTRAVENOUS | Status: AC
  Administered 2011-12-05: 20 mg via INTRAVENOUS

## 2011-12-05 MED ORDER — SODIUM CHLORIDE 0.9 % IV SOLN
Freq: Once | INTRAVENOUS | Status: AC
  Administered 2011-12-05: 12:00:00 via INTRAVENOUS

## 2011-12-05 MED ORDER — SODIUM CHLORIDE 0.9 % IV SOLN
142.6000 mg | Freq: Once | INTRAVENOUS | Status: AC
  Administered 2011-12-05: 140 mg via INTRAVENOUS
  Filled 2011-12-05: qty 14

## 2011-12-05 MED ORDER — DIPHENHYDRAMINE HCL 50 MG/ML IJ SOLN
25.0000 mg | Freq: Once | INTRAMUSCULAR | Status: AC
  Administered 2011-12-05: 25 mg via INTRAVENOUS

## 2011-12-05 MED ORDER — DEXAMETHASONE SODIUM PHOSPHATE 4 MG/ML IJ SOLN
20.0000 mg | Freq: Once | INTRAMUSCULAR | Status: AC
  Administered 2011-12-05: 20 mg via INTRAVENOUS

## 2011-12-05 MED ORDER — ONDANSETRON 16 MG/50ML IVPB (CHCC)
16.0000 mg | Freq: Once | INTRAVENOUS | Status: AC
  Administered 2011-12-05: 16 mg via INTRAVENOUS

## 2011-12-05 MED ORDER — PACLITAXEL CHEMO INJECTION 300 MG/50ML
45.0000 mg/m2 | Freq: Once | INTRAVENOUS | Status: AC
  Administered 2011-12-05: 78 mg via INTRAVENOUS
  Filled 2011-12-05: qty 13

## 2011-12-05 NOTE — Patient Instructions (Addendum)
[facility] Discharge Instructions for Patients Receiving Chemotherapy  Today you received the following chemotherapy agents Taxol and Carboplatin.  To help prevent nausea and vomiting after your treatment, we encourage you to take your nausea medication.   If you develop nausea and vomiting that is not controlled by your nausea medication, call the clinic. If it is after clinic hours your family physician or the after hours number for the clinic or go to the Emergency Department.   BELOW ARE SYMPTOMS THAT SHOULD BE REPORTED IMMEDIATELY:  *FEVER GREATER THAN 100.5 F  *CHILLS WITH OR WITHOUT FEVER  NAUSEA AND VOMITING THAT IS NOT CONTROLLED WITH YOUR NAUSEA MEDICATION  *UNUSUAL SHORTNESS OF BREATH  *UNUSUAL BRUISING OR BLEEDING  TENDERNESS IN MOUTH AND THROAT WITH OR WITHOUT PRESENCE OF ULCERS  *URINARY PROBLEMS  *BOWEL PROBLEMS  UNUSUAL RASH Items with * indicate a potential emergency and should be followed up as soon as possible.  One of the nurses will contact you 24 hours after your treatment. Please let the nurse know about any problems that you may have experienced. Feel free to call the clinic you have any questions or concerns. The clinic phone number is [phone].   I have been informed and understand all the instructions given to me. I know to contact the clinic, my physician, or go to the Emergency Department if any problems should occur. I do not have any questions at this time, but understand that I may call the clinic during office hours   should I have any questions or need assistance in obtaining follow up care.    __________________________________________  _____________  __________ Signature of Patient or Authorized Representative            Date                   Time    __________________________________________ Nurse's Signature    

## 2011-12-06 ENCOUNTER — Telehealth: Payer: Self-pay | Admitting: *Deleted

## 2011-12-06 ENCOUNTER — Ambulatory Visit
Admission: RE | Admit: 2011-12-06 | Discharge: 2011-12-06 | Disposition: A | Discharge status: Home / Self Care | Payer: Medicare Other | Source: Ambulatory Visit | Attending: Radiation Oncology | Admitting: Radiation Oncology

## 2011-12-06 NOTE — Telephone Encounter (Signed)
Pt's wife called stating that he has red places on his arms that are new, and wants to know if pt's plts are too low.  Per Tiana Loft, Plts are 115, this should not be causing the petechiae.  Pt does not have any other signs of bleeding.  Per Dimas Alexandria, continue to monitor patient.  Pt's wife verbalized understanding and will call if anything changes.  Also educated pt regarding neutropenic pxns.  SLJ

## 2011-12-06 NOTE — Progress Notes (Signed)
Pt and wife in nursing after radiation tx this morning. Pt noticed widely scattered petechiae on his arms this morning while bathing. He denies itching, pain. Pt received chemo Mon, has had several chemo tx and states the only new med he is taking is Medrol dose pack. Pt assessed by Virgina Organ RN as well. Pt's platelet ct is low this week @ 115 on 12/05/11. Pt's petechiae felt to be due to low platelets. Advised pt to use extra caution about coming in contact with hard surfaces. Pt will be seen on 12/08/11 by Dr Kathrynn Running, PUT day.

## 2011-12-07 ENCOUNTER — Ambulatory Visit
Admission: RE | Admit: 2011-12-07 | Discharge: 2011-12-07 | Disposition: A | Discharge status: Home / Self Care | Payer: Medicare Other | Source: Ambulatory Visit | Attending: Radiation Oncology | Admitting: Radiation Oncology

## 2011-12-08 ENCOUNTER — Ambulatory Visit
Admission: RE | Admit: 2011-12-08 | Discharge: 2011-12-08 | Disposition: A | Discharge status: Home / Self Care | Payer: Medicare Other | Source: Ambulatory Visit | Attending: Radiation Oncology | Admitting: Radiation Oncology

## 2011-12-08 ENCOUNTER — Encounter: Payer: Self-pay | Admitting: Radiation Oncology

## 2011-12-08 VITALS — BP 151/89 | HR 101 | Temp 97.4°F | Resp 20 | Wt 130.2 lb

## 2011-12-08 DIAGNOSIS — R918 Other nonspecific abnormal finding of lung field: Secondary | ICD-10-CM

## 2011-12-08 NOTE — Progress Notes (Signed)
  Radiation Oncology         [phone] ________________________________  Name: Justin Pittman MRN: [id number]  Date: 12/08/2011  DOB: Aug 30, 1932  Weekly Radiation Therapy Management  Current Dose: 56 Gy     Planned Dose:  66 Gy  Narrative . . . . . . . . The patient presents for routine under treatment assessment.                                                Pt states he has mild pain in his right chest w/occasional sharp pains. Pt states he's not had to take pain med for last several days. Dry cough, denies sob. C/o "a little bit of constipation". Taking stool softeners, 2-3 nightly. Last BM 3 days ago. Eating prunes, cereal w/high fiber. Advised he use Fleets enema tonight if no BM. Pt states he has taken MOM in past w/good results; advised pt he may take x 1 if needed but not to depend on MOM to make his bowels move.  Pt's petechiae resolving on arms.  Applying Radiaplex to chest and mid back for hyperpigmentation      The patient is without complaint.                                 Set-up films were reviewed.                                 The chart was checked. Physical Findings. . .  weight is 130 lb 3.2 oz (59.058 kg). His oral temperature is 97.4 F (36.3 C). His blood pressure is 151/89 and his pulse is 101. His respiration is 20. . Weight essentially stable.  No significant changes. Impression . . . . . . . The patient is  tolerating radiation. Plan . . . . . . . . . . . . Continue treatment as planned.  ________________________________  Artist Pais. Kathrynn Running, M.D.

## 2011-12-08 NOTE — Progress Notes (Signed)
Pt states he has mild pain in his right chest w/occasional sharp pains. Pt states he's not had to take pain med for last several days. Dry cough, denies sob. C/o "a little bit of constipation". Taking stool softeners, 2-3 nightly. Last BM 3 days ago. Eating prunes, cereal w/high fiber. Advised he  use Fleets enema tonight if no BM. Pt states he has taken MOM in past w/good results; advised pt he may take x 1 if needed but not to depend on MOM to make his bowels move. Pt's petechiae resolving on arms. Applying Radiaplex to chest and mid back for hyperpigmentation.

## 2011-12-11 ENCOUNTER — Ambulatory Visit: Payer: Medicare Other

## 2011-12-11 ENCOUNTER — Telehealth: Payer: Self-pay | Admitting: *Deleted

## 2011-12-11 NOTE — Telephone Encounter (Signed)
Received call from pt's wife who states pt had "n/v x 1 on Sunday, has continued to be weak". On 12/10/11 pt's wife called Dr Clelia Croft, the dr on call, and she states Dr Clelia Croft suggested pt needs break from radiation. Per Dr Clelia Croft she gave pt Compazine @ 5 pm w/good relief. She states pt is eating and drinking small amts this morning. She states pt's BP 117/72, HR 97, O2 sat 97 % this morning. Wife concerned about pt's weakness, weight loss while undergoing radiation and chemo tx. Informed wife that both these therapies can cause loss of appetite, fatigue and weakness.  Wife does state pt is "having a  BM daily, so he must be eating enough". She inquired about pt coming in for tx tomorrow. Pt has radiation, lab and med onc appts tomorrow. Strongly advised she bring pt in for these appts so his condition can be evaluated more accurately. Also advised she have pt use wheelchair for all his appts. Informed wife that Dr Kathrynn Running is out of office until Wed, but can have rad onc dr see pt prior to resuming radiation tx if wife prefers. Wife verbalized understanding and agreement. This RN will call wife this afternoon to FU on pt's status.

## 2011-12-12 ENCOUNTER — Ambulatory Visit: Payer: Medicare Other | Admitting: Physician Assistant

## 2011-12-12 ENCOUNTER — Emergency Department (HOSPITAL_COMMUNITY): Payer: Medicare Other

## 2011-12-12 ENCOUNTER — Encounter: Payer: Medicare Other | Admitting: Nutrition

## 2011-12-12 ENCOUNTER — Ambulatory Visit: Payer: Medicare Other

## 2011-12-12 ENCOUNTER — Telehealth: Payer: Self-pay | Admitting: *Deleted

## 2011-12-12 ENCOUNTER — Encounter (HOSPITAL_COMMUNITY): Payer: Self-pay

## 2011-12-12 ENCOUNTER — Inpatient Hospital Stay (HOSPITAL_COMMUNITY)
Admission: EM | Admit: 2011-12-12 | Discharge: 2011-12-28 | DRG: 391 | Disposition: A | Discharge status: Skilled Nursing Facility | Payer: Medicare Other | Attending: Internal Medicine | Admitting: Internal Medicine

## 2011-12-12 ENCOUNTER — Other Ambulatory Visit: Payer: Medicare Other

## 2011-12-12 DIAGNOSIS — C3490 Malignant neoplasm of unspecified part of unspecified bronchus or lung: Secondary | ICD-10-CM | POA: Diagnosis present

## 2011-12-12 DIAGNOSIS — E876 Hypokalemia: Secondary | ICD-10-CM | POA: Diagnosis present

## 2011-12-12 DIAGNOSIS — D638 Anemia in other chronic diseases classified elsewhere: Secondary | ICD-10-CM | POA: Diagnosis present

## 2011-12-12 DIAGNOSIS — E860 Dehydration: Secondary | ICD-10-CM

## 2011-12-12 DIAGNOSIS — R5381 Other malaise: Secondary | ICD-10-CM

## 2011-12-12 DIAGNOSIS — Y842 Radiological procedure and radiotherapy as the cause of abnormal reaction of the patient, or of later complication, without mention of misadventure at the time of the procedure: Secondary | ICD-10-CM | POA: Diagnosis present

## 2011-12-12 DIAGNOSIS — T66XXXS Radiation sickness, unspecified, sequela: Secondary | ICD-10-CM

## 2011-12-12 DIAGNOSIS — R42 Dizziness and giddiness: Secondary | ICD-10-CM

## 2011-12-12 DIAGNOSIS — D61818 Other pancytopenia: Secondary | ICD-10-CM | POA: Diagnosis present

## 2011-12-12 DIAGNOSIS — I10 Essential (primary) hypertension: Secondary | ICD-10-CM | POA: Diagnosis present

## 2011-12-12 DIAGNOSIS — R1115 Cyclical vomiting syndrome unrelated to migraine: Secondary | ICD-10-CM | POA: Diagnosis present

## 2011-12-12 DIAGNOSIS — D630 Anemia in neoplastic disease: Secondary | ICD-10-CM | POA: Diagnosis present

## 2011-12-12 DIAGNOSIS — R531 Weakness: Secondary | ICD-10-CM

## 2011-12-12 DIAGNOSIS — R339 Retention of urine, unspecified: Secondary | ICD-10-CM | POA: Diagnosis present

## 2011-12-12 DIAGNOSIS — K208 Other esophagitis without bleeding: Principal | ICD-10-CM | POA: Diagnosis present

## 2011-12-12 DIAGNOSIS — I951 Orthostatic hypotension: Secondary | ICD-10-CM

## 2011-12-12 DIAGNOSIS — T451X5A Adverse effect of antineoplastic and immunosuppressive drugs, initial encounter: Secondary | ICD-10-CM | POA: Diagnosis present

## 2011-12-12 DIAGNOSIS — E46 Unspecified protein-calorie malnutrition: Secondary | ICD-10-CM | POA: Diagnosis present

## 2011-12-12 DIAGNOSIS — J690 Pneumonitis due to inhalation of food and vomit: Secondary | ICD-10-CM | POA: Diagnosis present

## 2011-12-12 DIAGNOSIS — C3430 Malignant neoplasm of lower lobe, unspecified bronchus or lung: Secondary | ICD-10-CM | POA: Diagnosis present

## 2011-12-12 DIAGNOSIS — R5383 Other fatigue: Secondary | ICD-10-CM

## 2011-12-12 DIAGNOSIS — B370 Candidal stomatitis: Secondary | ICD-10-CM | POA: Diagnosis present

## 2011-12-12 LAB — CBC WITH DIFFERENTIAL/PLATELET
Basophils Absolute: 0 10*3/uL (ref 0.0–0.1)
Lymphs Abs: 0.2 10*3/uL — ABNORMAL LOW (ref 0.7–4.0)
MCH: 27.6 pg (ref 26.0–34.0)
MCV: 78.5 fL (ref 78.0–100.0)
Monocytes Absolute: 0.4 10*3/uL (ref 0.1–1.0)
Platelets: 159 10*3/uL (ref 150–400)
RDW: 15.2 % (ref 11.5–15.5)
WBC: 2.3 10*3/uL — ABNORMAL LOW (ref 4.0–10.5)

## 2011-12-12 LAB — URINALYSIS, ROUTINE W REFLEX MICROSCOPIC
Ketones, ur: 15 mg/dL — AB
Leukocytes, UA: NEGATIVE
Nitrite: NEGATIVE
Protein, ur: NEGATIVE mg/dL

## 2011-12-12 LAB — BASIC METABOLIC PANEL
Calcium: 8.6 mg/dL (ref 8.4–10.5)
Creatinine, Ser: 0.9 mg/dL (ref 0.50–1.35)
GFR calc Af Amer: >90 mL/min (ref >90)
GFR calc non Af Amer: 80 mL/min — ABNORMAL LOW (ref >90)

## 2011-12-12 MED ORDER — SODIUM CHLORIDE 0.9 % IV SOLN: INTRAVENOUS | Status: DC

## 2011-12-12 MED ORDER — SODIUM CHLORIDE 0.9 % IV SOLN
INTRAVENOUS | Status: DC
  Administered 2011-12-13 (×2): 1000 mL via INTRAVENOUS
  Administered 2011-12-14: 03:00:00 via INTRAVENOUS

## 2011-12-12 MED ORDER — FENTANYL CITRATE 0.05 MG/ML IJ SOLN
50.0000 ug | Freq: Once | INTRAMUSCULAR | Status: AC
  Administered 2011-12-12: 50 ug via INTRAVENOUS
  Filled 2011-12-12: qty 2

## 2011-12-12 MED ORDER — SODIUM CHLORIDE 0.9 % IV BOLUS (SEPSIS)
1000.0000 mL | Freq: Once | INTRAVENOUS | Status: AC
  Administered 2011-12-12: 1000 mL via INTRAVENOUS

## 2011-12-12 MED ORDER — SENNOSIDES-DOCUSATE SODIUM 8.6-50 MG PO TABS
1.0000 | ORAL_TABLET | Freq: Every evening | ORAL | Status: DC | PRN
  Filled 2011-12-12 (×2): qty 1

## 2011-12-12 MED ORDER — ONDANSETRON HCL 4 MG/2ML IJ SOLN
4.0000 mg | Freq: Four times a day (QID) | INTRAMUSCULAR | Status: DC | PRN
  Administered 2011-12-13 – 2011-12-25 (×3): 4 mg via INTRAVENOUS
  Filled 2011-12-12 (×4): qty 2

## 2011-12-12 MED ORDER — FENTANYL CITRATE 0.05 MG/ML IJ SOLN
25.0000 ug | INTRAMUSCULAR | Status: DC
  Administered 2011-12-12 – 2011-12-13 (×4): 25 ug via INTRAVENOUS
  Filled 2011-12-12 (×4): qty 2

## 2011-12-12 MED ORDER — PANTOPRAZOLE SODIUM 40 MG IV SOLR
40.0000 mg | INTRAVENOUS | Status: DC
  Administered 2011-12-12 – 2011-12-27 (×16): 40 mg via INTRAVENOUS
  Filled 2011-12-12 (×17): qty 40

## 2011-12-12 MED ORDER — SODIUM CHLORIDE 0.9 % IV SOLN
INTRAVENOUS | Status: DC
  Administered 2011-12-12 (×2): via INTRAVENOUS

## 2011-12-12 MED ORDER — ONDANSETRON HCL 4 MG PO TABS: 4.0000 mg | ORAL_TABLET | Freq: Four times a day (QID) | ORAL | Status: DC | PRN

## 2011-12-12 MED ORDER — ENOXAPARIN SODIUM 40 MG/0.4ML ~~LOC~~ SOLN
40.0000 mg | SUBCUTANEOUS | Status: DC
  Administered 2011-12-12 – 2011-12-13 (×2): 40 mg via SUBCUTANEOUS
  Filled 2011-12-12 (×3): qty 0.4

## 2011-12-12 NOTE — Plan of Care (Signed)
Problem: Phase III Progression Outcomes Goal: Voiding independently Outcome: Progressing Pt will be able to go to the bathroom without assistance at discharge.

## 2011-12-12 NOTE — ED Notes (Signed)
Per EMS- patient reported that he has been feeling weak and nauseated x 3 days. Patient is currently receiving radiation treatments for lung cancer. zofran 4 mg IV given per EMS.

## 2011-12-12 NOTE — Telephone Encounter (Signed)
Spoke w/Onyeje RN for pt on rm 1308 who states "admitting Dr has not been in to evaluate pt yet". She states dr has been paged to see pt. RN unsure if pt should receive radiation tx today. RN will call back after dr sees pt.

## 2011-12-12 NOTE — ED Notes (Signed)
Bed:WA18<BR> Expected date:<BR> Expected time:<BR> Means of arrival:<BR> Comments:<BR> ems

## 2011-12-12 NOTE — H&P (Addendum)
[facility]          History and Physical    PCP:   [name], MD   Chief Complaint:  Weakness, dizziness  HPI: Pleasant 78 y/o man who recently started treatment for lung cancer under Dr. Asa Lente care. 4 days ago he developed severe n/v. Ever since he has been so weak and dizzy upon standing he is not even able to go to the bathroom without becoming extremely dizzy. He was scheduled to go to the cancer center today for another treatment, but his wife decided to bring him to the ED instead. She is very frustrated about many things including the delay in diagnosis, that she has seen Dr. Arbutus Ped only once since initiation of treatment among multiple other things. Is adamant that her husband not receive any morphine as she has heard that "it can do weird things to you". We have been asked to admit him for further evaluation and management.  Allergies:  No Known Allergies    Past Medical History  Diagnosis Date  . HTN (hypertension)   . Lung cancer     Squamous Cell  . Vertigo 2010    Hospitalized a [facility]  . Hyperlipidemia   . Lung cancer     Past Surgical History  Procedure Date  . Tonsillectomy   . Video bronchoscopy 09/20/2011    Procedure: VIDEO BRONCHOSCOPY WITHOUT FLUORO;  Surgeon: Nyoka Cowden, MD;  Location: [facility];  Service: Cardiopulmonary;  Laterality: Bilateral;  . Tonsillectomy     Prior to Admission medications   Medication Sig Start Date End Date Taking? Authorizing Provider  famotidine (PEPCID) 20 MG tablet Take 20 mg by mouth at bedtime.   Yes Historical Provider, MD  KLOR-CON M10 10 MEQ tablet Take 1 tablet by mouth daily.  06/08/11  Yes Historical Provider, MD  omeprazole (PRILOSEC) 20 MG capsule Take 20 mg by mouth daily before breakfast.   Yes Historical Provider, MD  prochlorperazine (COMPAZINE) 10 MG tablet Take 10 mg by mouth every 6 (six) hours as needed. nausea 10/23/11  Yes Si Gaul, MD  sucralfate (CARAFATE) 1 G  tablet Take 1 g by mouth 4 (four) times daily. Dissolve 1 tab. In water qid ac 5 min. Before food prn discomfort.Disp. # 60 w/ 3 refills Called into [facility] on [address] 10/27/11  Yes Oneita Hurt, MD    Social History:  reports that he quit smoking about 18 years ago. His smoking use included Cigarettes. He has a 40 pack-year smoking history. He has never used smokeless tobacco. He reports that he does not drink alcohol or use illicit drugs.  Family History  Problem Relation Age of Onset  . Cancer Brother     Bone    Review of Systems:  Constitutional: Denies fever, chills, diaphoresis, appetite change and fatigue.  HEENT: Denies photophobia, eye pain, redness, hearing loss, ear pain, congestion, sore throat, rhinorrhea, sneezing, mouth sores, trouble swallowing, neck pain, neck stiffness and tinnitus.   Respiratory: Denies SOB, DOE, cough, chest tightness,  and wheezing.   Cardiovascular: Denies chest pain, palpitations and leg swelling.  Gastrointestinal: Denies nausea, vomiting, abdominal pain, diarrhea, constipation, blood in stool and abdominal distention.  Genitourinary: Denies dysuria, urgency, frequency, hematuria, flank pain and difficulty urinating.  Musculoskeletal: Denies myalgias, back pain, joint swelling, arthralgias and gait problem.  Skin: Denies pallor, rash and wound.  Neurological: Denies dizziness, seizures, syncope, weakness, light-headedness, numbness and headaches.  Hematological: Denies adenopathy. Easy bruising, personal or family bleeding  history  Psychiatric/Behavioral: Denies suicidal ideation, mood changes, confusion, nervousness, sleep disturbance and agitation   Physical Exam: Blood pressure 139/62, pulse 84, temperature 98.1 F (36.7 C), temperature source Oral, resp. rate 18, height 5\' 10"  (1.778 m), weight 56 kg (123 lb 7.3 oz), SpO2 97.00%. Gen: AA Ox3, is very pale and seems very weak and dizzy (keeps his eyes closed during our time  together). HEENT: [place]/AT/PERRL/EOMI/very dry mucous membranes with cracked lips and tongue. Neck: supple, no JVD, no LAD, no bruits, no goiter. CV: RRR, no M/R/G Lungs: CTA B Abd: S/NT/NT/+BS/no masses or organomegaly noted. Ext: no C/C/E, +pedal pulses. Neuro: grossly intact and non-focal. I have not ambulated him.   Labs on Admission:  Results for orders placed during the hospital encounter of 12/12/11 (from the past 48 hour(s))  CBC WITH DIFFERENTIAL     Status: Abnormal   Collection Time   12/12/11  9:10 AM      Component Value Range Comment   WBC 2.3 (*) 4.0 - 10.5 K/uL    RBC 3.26 (*) 4.22 - 5.81 MIL/uL    Hemoglobin 9.0 (*) 13.0 - 17.0 g/dL    HCT 40.9 (*) 81.1 - 52.0 %    MCV 78.5  78.0 - 100.0 fL    MCH 27.6  26.0 - 34.0 pg    MCHC 35.2  30.0 - 36.0 g/dL    RDW 91.4  78.2 - 95.6 %    Platelets 159  150 - 400 K/uL    Neutrophils Relative 75  43 - 77 %    Lymphocytes Relative 9 (*) 12 - 46 %    Monocytes Relative 16 (*) 3 - 12 %    Eosinophils Relative 0  0 - 5 %    Basophils Relative 0  0 - 1 %    Neutro Abs 1.7  1.7 - 7.7 K/uL    Lymphs Abs 0.2 (*) 0.7 - 4.0 K/uL    Monocytes Absolute 0.4  0.1 - 1.0 K/uL    Eosinophils Absolute 0.0  0.0 - 0.7 K/uL    Basophils Absolute 0.0  0.0 - 0.1 K/uL    Smear Review MORPHOLOGY UNREMARKABLE     BASIC METABOLIC PANEL     Status: Abnormal   Collection Time   12/12/11  9:10 AM      Component Value Range Comment   Sodium 135  135 - 145 mEq/L    Potassium 3.3 (*) 3.5 - 5.1 mEq/L    Chloride 98  96 - 112 mEq/L    CO2 27  19 - 32 mEq/L    Glucose, Bld 104 (*) 70 - 99 mg/dL    BUN 26 (*) 6 - 23 mg/dL    Creatinine, Ser 2.13  0.50 - 1.35 mg/dL    Calcium 8.6  8.4 - 08.6 mg/dL    GFR calc non Af Amer 80 (*) >90 mL/min    GFR calc Af Amer >90  >90 mL/min   URINALYSIS, ROUTINE W REFLEX MICROSCOPIC     Status: Abnormal   Collection Time   12/12/11 11:28 AM      Component Value Range Comment   Color, Urine YELLOW  YELLOW     APPearance CLEAR  CLEAR    Specific Gravity, Urine 1.021  1.005 - 1.030    pH 6.0  5.0 - 8.0    Glucose, UA NEGATIVE  NEGATIVE mg/dL    Hgb urine dipstick NEGATIVE  NEGATIVE    Bilirubin Urine NEGATIVE  NEGATIVE    Ketones, ur 15 (*) NEGATIVE mg/dL    Protein, ur NEGATIVE  NEGATIVE mg/dL    Urobilinogen, UA 1.0  0.0 - 1.0 mg/dL    Nitrite NEGATIVE  NEGATIVE    Leukocytes, UA NEGATIVE  NEGATIVE MICROSCOPIC NOT DONE ON URINES WITH NEGATIVE PROTEIN, BLOOD, LEUKOCYTES, NITRITE, OR GLUCOSE <1000 mg/dL.    Radiological Exams on Admission: Dg Chest 2 View  12/12/2011  *RADIOLOGY REPORT*  Clinical Data: Lung cancer, weakness post radiation.  CHEST - 2 VIEW  Comparison: 10/11/2011  Findings: Right perihilar mass is difficult to accurately measure however appears smaller in the interval.  There may be secondary necrosis/cavitation.  No new airspace opacity, pneumothorax, or pleural effusion.  Unchanged cardiomediastinal contours otherwise with aortic atherosclerotic calcification.  No acute osseous finding.  IMPRESSION: Decreased size of the right perihilar mass with question of necrosis /cavitation.  Otherwise, no acute change identified.   Original Report Authenticated By: Waneta Martins, M.D.     Assessment/Plan Principal Problem:  *Orthostatic hypotension Active Problems:  Malignant neoplasm of bronchus and lung, unspecified site  Weakness generalized    Orthostatic Hypotension -BP drops to 70/40 upon standing and he becomes very symptomatic. -Suspect related to severe GI losses, mainly n/v, as a side effect from his chemoradiation treatment for his lung cancer. -Has received 1.5 L by the time I am seeing him. -Continue IVF at 125 cc/hr tonight. -Will have PT/OT assessments.  Lung cancer -Will alert Dr. Arbutus Ped that patient is in the hospital and request a consultation. -Patient's wife has a lot of questions regarding his treatment that I am ill-equipped to answer. -Hold further  radiation treatments until seen by Dr. Arbutus Ped. -Received fentanyl in the ED for pain relief. Wife does not want him to receive morphine. Will continue IV fentanyl PRN. -He has not had a need for pain meds in the past. -Once he tolerates the PO route, he may benefit from initiation of long-acting pain meds. -Can also consider a fentanyl patch.  DVT Prophylaxis -Lovenox.   Time Spent on Admission: 70 minutes  HERNANDEZ ACOSTA,ESTELA [facility] Pager: [phone] 12/12/2011, 5:15 PM

## 2011-12-12 NOTE — Progress Notes (Signed)
Observation review is complete. 

## 2011-12-12 NOTE — ED Provider Notes (Signed)
History     CSN: [id number]  Arrival date & time 12/12/11  7829   First MD Initiated Contact with Patient 12/12/11 [phone]      Chief Complaint  Patient presents with  . Weakness    (Consider location/radiation/quality/duration/timing/severity/associated sxs/prior treatment) HPI Pt with history of lung cancer, currently getting weekly chemo and daily radiation treatments has had several days of increasing generalized weakness, nausea and occasional vomiting. He has had poor PO intake. States he feels dizzy with standing and unable to get to his appointments later this morning. He has not had radiation for the last several days due to feeling poorly. Was scheduled for labs and chemo today. He has mild sharp pain to R chest wall at the site of his tumor. Denies fever, no diarrhea, no abdominal pain. No blood in stool or emesis.    Past Medical History  Diagnosis Date  . HTN (hypertension)   . Lung cancer     Squamous Cell  . Vertigo 2010    Hospitalized a [facility]  . Hyperlipidemia   . Lung cancer     Past Surgical History  Procedure Date  . Tonsillectomy   . Video bronchoscopy 09/20/2011    Procedure: VIDEO BRONCHOSCOPY WITHOUT FLUORO;  Surgeon: Nyoka Cowden, MD;  Location: [facility];  Service: Cardiopulmonary;  Laterality: Bilateral;  . Tonsillectomy     Family History  Problem Relation Age of Onset  . Cancer Brother     Bone    History  Substance Use Topics  . Smoking status: Former Smoker -- 1.0 packs/day for 40 years    Types: Cigarettes    Quit date: 02/27/1993  . Smokeless tobacco: Never Used  . Alcohol Use: No      Review of Systems All other systems reviewed and are negative except as noted in HPI.   Allergies  Review of patient's allergies indicates no known allergies.  Home Medications   Current Outpatient Rx  Name Route Sig Dispense Refill  . ASPIRIN 81 MG PO TABS Oral Take 81 mg by mouth daily.    . ATORVASTATIN CALCIUM 20 MG PO TABS Oral Take  20 mg by mouth daily with breakfast.     . CHLORTHALIDONE 25 MG PO TABS Oral Take 0.5 tablets by mouth daily with breakfast.     . FAMOTIDINE 20 MG PO TABS Oral Take 20 mg by mouth at bedtime.    Marland Kitchen HYDROCODONE-ACETAMINOPHEN 7.5-500 MG/15ML PO SOLN Oral Take 10-30 mLs by mouth every 6 (six) hours as needed for pain or cough. 480 mL 0  . KLOR-CON M10 10 MEQ PO TBCR Oral Take 1 tablet by mouth Daily.    . METHYLPREDNISOLONE 4 MG PO KIT  follow package directions 21 tablet 0  . NEBIVOLOL HCL 5 MG PO TABS Oral Take 0.5 tablets (2.5 mg total) by mouth daily with breakfast. 30 tablet 5  . OMEPRAZOLE 20 MG PO CPDR Oral Take 20 mg by mouth daily before breakfast.    . PROCHLORPERAZINE MALEATE 10 MG PO TABS Oral Take 10 mg by mouth every 6 (six) hours as needed.    . SUCRALFATE 1 G PO TABS Oral Take 1 g by mouth 4 (four) times daily. Dissolve 1 tab. In water qid ac 5 min. Before food prn discomfort.Disp. # 60 w/ 3 refills Called into [facility] on [address].    Marland Kitchen RADIAPLEX EX Apply externally Apply topically.      BP 70/44  Pulse 99  Temp 97.9 F (36.6  C) (Oral)  Resp 18  SpO2 99%  Physical Exam  Nursing note and vitals reviewed. Constitutional: He is oriented to person, place, and time. He appears well-developed.       thin  HENT:  Head: Normocephalic and atraumatic.  Eyes: EOM are normal. Pupils are equal, round, and reactive to light.  Neck: Normal range of motion. Neck supple.  Cardiovascular: Normal rate, normal heart sounds and intact distal pulses.   Pulmonary/Chest: Effort normal and breath sounds normal. He exhibits no tenderness.       No chest wall rash  Abdominal: Bowel sounds are normal. He exhibits no distension. There is no tenderness.  Musculoskeletal: Normal range of motion. He exhibits no edema and no tenderness.  Neurological: He is alert and oriented to person, place, and time. He has normal strength. No cranial nerve deficit or sensory deficit.  Skin: Skin is warm  and dry. No rash noted. There is pallor.  Psychiatric: He has a normal mood and affect.    ED Course  Procedures (including critical care time)  Labs Reviewed  CBC WITH DIFFERENTIAL - Abnormal; Notable for the following:    WBC 2.3 (*)     RBC 3.26 (*)     Hemoglobin 9.0 (*)     HCT 25.6 (*)     Lymphocytes Relative 9 (*)     Monocytes Relative 16 (*)     Lymphs Abs 0.2 (*)     All other components within normal limits  BASIC METABOLIC PANEL - Abnormal; Notable for the following:    Potassium 3.3 (*)     Glucose, Bld 104 (*)     BUN 26 (*)     GFR calc non Af Amer 80 (*)     All other components within normal limits  URINALYSIS, ROUTINE W REFLEX MICROSCOPIC - Abnormal; Notable for the following:    Ketones, ur 15 (*)     All other components within normal limits  BASIC METABOLIC PANEL - Abnormal; Notable for the following:    Calcium 8.1 (*)     GFR calc non Af Amer 83 (*)     All other components within normal limits  CBC - Abnormal; Notable for the following:    WBC 2.4 (*)     RBC 2.96 (*)     Hemoglobin 8.1 (*)     HCT 23.6 (*)     Platelets 141 (*)     All other components within normal limits   No results found.   No diagnosis found.    MDM  Pt is orthostatic. Will give IVF, check labs and reassess.    Pt still feeling weak after fluids. Will admit for hydration.     Cameran B. Bernette Mayers, MD 12/13/11 1320

## 2011-12-12 NOTE — Telephone Encounter (Signed)
Spoke w/Onyeje RN who states dr has not seen pt, and wife states pt is "too weak for treatment". Wife told RN she does not want pt to have any more tx at this time. Informed RN I will call tomorrow for update on pt. Tomo notified to cancel pt's tx today.

## 2011-12-12 NOTE — ED Notes (Signed)
EDP notified of orthostatic vital signs. Patient near syncope.

## 2011-12-12 NOTE — Telephone Encounter (Signed)
Received call from pt's son who states pt is in ED. This RN had noted that at approximately 10:15 am this morning and notified Tomo. Son states "they did blood work and took some xrays." Assured son that this RN will follow pt's progress. Pt admitted w/weakness, receiving IVF per ED note.

## 2011-12-13 ENCOUNTER — Telehealth: Payer: Self-pay | Admitting: *Deleted

## 2011-12-13 ENCOUNTER — Telehealth: Payer: Self-pay | Admitting: Medical Oncology

## 2011-12-13 ENCOUNTER — Ambulatory Visit: Payer: Medicare Other

## 2011-12-13 DIAGNOSIS — E46 Unspecified protein-calorie malnutrition: Secondary | ICD-10-CM | POA: Diagnosis present

## 2011-12-13 DIAGNOSIS — D61818 Other pancytopenia: Secondary | ICD-10-CM | POA: Diagnosis present

## 2011-12-13 DIAGNOSIS — K208 Other esophagitis without bleeding: Principal | ICD-10-CM

## 2011-12-13 DIAGNOSIS — R0789 Other chest pain: Secondary | ICD-10-CM

## 2011-12-13 DIAGNOSIS — C3430 Malignant neoplasm of lower lobe, unspecified bronchus or lung: Secondary | ICD-10-CM

## 2011-12-13 DIAGNOSIS — I10 Essential (primary) hypertension: Secondary | ICD-10-CM | POA: Diagnosis present

## 2011-12-13 DIAGNOSIS — D638 Anemia in other chronic diseases classified elsewhere: Secondary | ICD-10-CM | POA: Diagnosis present

## 2011-12-13 DIAGNOSIS — E860 Dehydration: Secondary | ICD-10-CM

## 2011-12-13 DIAGNOSIS — R1310 Dysphagia, unspecified: Secondary | ICD-10-CM

## 2011-12-13 DIAGNOSIS — C3490 Malignant neoplasm of unspecified part of unspecified bronchus or lung: Secondary | ICD-10-CM | POA: Diagnosis present

## 2011-12-13 LAB — BASIC METABOLIC PANEL
CO2: 25 mEq/L (ref 19–32)
Calcium: 8.1 mg/dL — ABNORMAL LOW (ref 8.4–10.5)
Chloride: 102 mEq/L (ref 96–112)
Creatinine, Ser: 0.82 mg/dL (ref 0.50–1.35)
Glucose, Bld: 76 mg/dL (ref 70–99)
Sodium: 136 mEq/L (ref 135–145)

## 2011-12-13 LAB — CBC
HCT: 23.6 % — ABNORMAL LOW (ref 39.0–52.0)
MCH: 27.4 pg (ref 26.0–34.0)
MCV: 79.7 fL (ref 78.0–100.0)
Platelets: 141 10*3/uL — ABNORMAL LOW (ref 150–400)
RBC: 2.96 MIL/uL — ABNORMAL LOW (ref 4.22–5.81)

## 2011-12-13 LAB — GLUCOSE, CAPILLARY: Glucose-Capillary: 159 mg/dL — ABNORMAL HIGH (ref 70–99)

## 2011-12-13 MED ORDER — HYDROMORPHONE HCL PF 2 MG/ML IJ SOLN
2.0000 mg | INTRAMUSCULAR | Status: DC | PRN
  Administered 2011-12-13 – 2011-12-17 (×21): 2 mg via INTRAVENOUS
  Filled 2011-12-13 (×22): qty 1

## 2011-12-13 MED ORDER — LIDOCAINE VISCOUS 2 % MT SOLN
20.0000 mL | OROMUCOSAL | Status: DC | PRN
  Filled 2011-12-13: qty 20

## 2011-12-13 MED ORDER — SODIUM CHLORIDE 0.9 % IJ SOLN
10.0000 mL | Freq: Two times a day (BID) | INTRAMUSCULAR | Status: DC
  Administered 2011-12-14 – 2011-12-23 (×7): 10 mL
  Administered 2011-12-26: 40 mL
  Administered 2011-12-27: 20 mL

## 2011-12-13 MED ORDER — INSULIN ASPART 100 UNIT/ML ~~LOC~~ SOLN
0.0000 [IU] | Freq: Three times a day (TID) | SUBCUTANEOUS | Status: DC
  Administered 2011-12-14: 3 [IU] via SUBCUTANEOUS
  Administered 2011-12-14 – 2011-12-22 (×20): 2 [IU] via SUBCUTANEOUS
  Administered 2011-12-22: 3 [IU] via SUBCUTANEOUS
  Administered 2011-12-23 – 2011-12-28 (×15): 2 [IU] via SUBCUTANEOUS

## 2011-12-13 MED ORDER — FAT EMULSION 20 % IV EMUL
250.0000 mL | INTRAVENOUS | Status: AC
  Administered 2011-12-13: 250 mL via INTRAVENOUS
  Filled 2011-12-13: qty 250

## 2011-12-13 MED ORDER — HYDRALAZINE HCL 20 MG/ML IJ SOLN
10.0000 mg | Freq: Four times a day (QID) | INTRAMUSCULAR | Status: DC | PRN
  Administered 2011-12-15: 10 mg via INTRAVENOUS
  Filled 2011-12-13: qty 1

## 2011-12-13 MED ORDER — ZINC TRACE METAL 1 MG/ML IV SOLN
INTRAVENOUS | Status: AC
  Administered 2011-12-13: 18:00:00 via INTRAVENOUS
  Filled 2011-12-13: qty 1000

## 2011-12-13 MED ORDER — SODIUM CHLORIDE 0.9 % IJ SOLN
10.0000 mL | INTRAMUSCULAR | Status: DC | PRN
  Administered 2011-12-20 – 2011-12-22 (×2): 10 mL

## 2011-12-13 MED ORDER — FLUCONAZOLE 100MG IVPB
100.0000 mg | INTRAVENOUS | Status: DC
  Administered 2011-12-13 – 2011-12-17 (×5): 100 mg via INTRAVENOUS
  Filled 2011-12-13 (×6): qty 50

## 2011-12-13 NOTE — Progress Notes (Signed)
PT Cancellation Note  Patient Details Name: Yaacov Koziol MRN: [id number] DOB: May 03, 1932   Cancelled Treatment:    Reason Eval/Treat Not Completed: Fatigue/lethargy limiting ability to participate;Patient's level of consciousness;Other (comment) (wife feels pt not ready for PT) Pt had pain and nausea medications this morning and pt is sleeping very soundly, did not arouse to verbal stimuli. Will re-attempt tomorrow.    Ralene Bathe Kistler 12/13/2011, 11:19 AM [phone]

## 2011-12-13 NOTE — Progress Notes (Signed)
Peripherally Inserted Central Catheter/Midline Placement  The IV Nurse has discussed with the patient and/or persons authorized to consent for the patient, the purpose of this procedure and the potential benefits and risks involved with this procedure.  The benefits include less needle sticks, lab draws from the catheter and patient may be discharged home with the catheter.  Risks include, but not limited to, infection, bleeding, blood clot (thrombus formation), and puncture of an artery; nerve damage and irregular heat beat.  Alternatives to this procedure were also discussed.  PICC/Midline Placement Documentation        Lisabeth Devoid 12/13/2011, 5:31 PM Consent obtained by Stacie Glaze, RN  IV Team

## 2011-12-13 NOTE — Progress Notes (Signed)
PARENTERAL NUTRITION CONSULT NOTE - INITIAL  Pharmacy Consult for TNA Indication: protein calorie malnutrition   No Known Allergies  Patient Measurements: Height: 5\' 10"  (177.8 cm) Weight: 123 lb 7.3 oz (56 kg) IBW/kg (Calculated) : 73   Vital Signs: Temp: 98.3 F (36.8 C) (10/16 0945) Temp src: Oral (10/16 0945) BP: 163/68 mmHg (10/16 0945) Pulse Rate: 80  (10/16 0945) Intake/Output from previous day: 10/15 0701 - 10/16 0700 In: 120 [P.O.:120] Out: 600 [Urine:600] Intake/Output from this shift: Total I/O In: -  Out: 200 [Urine:200]  Labs:  [facility] 12/13/11 0325 12/12/11 0910  WBC 2.4* 2.3*  HGB 8.1* 9.0*  HCT 23.6* 25.6*  PLT 141* 159  APTT -- --  INR -- --     Basename 12/13/11 0325 12/12/11 0910  NA 136 135  K 3.7 3.3*  CL 102 98  CO2 25 27  GLUCOSE 76 104*  BUN 21 26*  CREATININE 0.82 0.90  LABCREA -- --  CREAT24HRUR -- --  CALCIUM 8.1* 8.6  MG -- --  PHOS -- --  PROT -- --  ALBUMIN -- --  AST -- --  ALT -- --  ALKPHOS -- --  BILITOT -- --  BILIDIR -- --  IBILI -- --  PREALBUMIN -- --  TRIG -- --  CHOLHDL -- --  CHOL -- --   Estimated Creatinine Clearance: 59.8 ml/min (by C-G formula based on Cr of 0.82).   No results found for this basename: GLUCAP:3 in the last 72 hours  Medical History: Past Medical History  Diagnosis Date  . HTN (hypertension)   . Lung cancer     Squamous Cell  . Vertigo 2010    Hospitalized a [facility]  . Hyperlipidemia   . Lung cancer     Medications:  Prescriptions prior to admission  Medication Sig Dispense Refill  . famotidine (PEPCID) 20 MG tablet Take 20 mg by mouth at bedtime.      Marland Kitchen KLOR-CON M10 10 MEQ tablet Take 1 tablet by mouth daily.       Marland Kitchen omeprazole (PRILOSEC) 20 MG capsule Take 20 mg by mouth daily before breakfast.      . prochlorperazine (COMPAZINE) 10 MG tablet Take 10 mg by mouth every 6 (six) hours as needed. nausea      . sucralfate (CARAFATE) 1 G tablet Take 1 g by mouth 4 (four) times  daily. Dissolve 1 tab. In water qid ac 5 min. Before food prn discomfort.Disp. # 60 w/ 3 refills Called into [facility] on [address].       Scheduled:    . enoxaparin (LOVENOX) injection  40 mg Subcutaneous Q24H  . fentaNYL  50 mcg Intravenous Once  . fluconazole (DIFLUCAN) IV  100 mg Intravenous Q24H  . pantoprazole (PROTONIX) IV  40 mg Intravenous Q24H  . DISCONTD: sodium chloride   Intravenous STAT  . DISCONTD: fentaNYL  25 mcg Intravenous Q4H   Infusions:    . sodium chloride 1,000 mL (12/13/11 1020)  . DISCONTD: sodium chloride 125 mL/hr at 12/12/11 1408    Insulin Requirements in the past 24 hours:  0  Current Nutrition:  Regular Diet ordered - 10% of meals eaten NS @ 125 ml/hr    Assessment: 78 YO M with h/o stage IIIB NSCLC s/p chemoradiation. Last chemotherapy was given on 10/8. Patient presented 10/15 with nausea, vomiting, pain and difficulty with swallowing. Patient starting TNA for poor oral intake and protein calorie malnutrition.  Labs:  Lytes WNL  Renal  function WNL   Nutritional Goals:  1400-1960 kCal, 78-110 grams of protein per day RD assessment pending  Goal: Clinimix E5/20 at a goal rate of 70 ml/hr with lipids MWF will provide 84 g protein daily and 1478 kcal TTSS, 1958 kcal MWF, and an average of 1684 kcal/day.   Plan:  1. Start Clinimix E5/20 at a rate of 40 ml/hr. Will advance to goal as tolerated. 2. Begin lipids at 10 ml/hr, lipids MWF (d/t shortage) 3. Trace elements and MVI MWF (d/t shortage) 4. TNA labs at baseline 5. TNA labs Mondays and Thursdays 6. CBGs and moderate SSI every 8 hours   [employer], Pharm.D. Clinical Oncology Pharmacist  Pager # [phone]  12/13/2011,12:58 PM

## 2011-12-13 NOTE — Telephone Encounter (Signed)
Spoke w/Onyeje, RN for pt in rm 1308 re: pt to be treated w/radiation today. She spoke directly w/Dr Arbutus Ped who was on floor to see pt. Per Dr Arbutus Ped, pt not to receive radiation tx today. Notified Tomo.

## 2011-12-13 NOTE — Progress Notes (Signed)
DIAGNOSIS: Stage IIIB non-small cell lung cancer, squamous cell carcinoma with right lower lobe lung mass as well as left hilar lymphadenopathy diagnosed in August of 2013.   PRIOR THERAPY: None   CURRENT THERAPY: Concurrent chemoradiation with weekly carboplatin for AUC of 2 and paclitaxel 45 mg/M2, status post 6 weekly doses of treatment last dose was given on 12/05/2011.   Subjective: The patient is seen and examined today. No family member at the bedside. He is feeling a little bit better after being admitted for IV hydration yesterday. He continues to have pain on the chest area from the radiation induced esophagitis. He is currently on fentanyl IV with relief of pain but lasts only for around 3 hours. He still has some odynophagia. Since his diagnosis more than 8 weeks ago the patient was seen regularly as planned at the cancer Center at least twice by me and 1 by my physician assistant. He was doing fine and tolerating his concurrent chemoradiation fairly well until the last week when he started having significant radiation induced esophagitis with odynophagia and dysphagia. He had poor by mouth intake in the last few days and was admitted yesterday for IV hydration and management of his condition. He denied having any significant shortness of breath, cough or hemoptysis. Chest x-ray done yesterday showed improvement in his disease.  Objective: Vital signs in last 24 hours: Temp:  [98.1 F (36.7 C)-98.8 F (37.1 C)] 98.3 F (36.8 C) (10/16 0601) Pulse Rate:  [72-85] 72  (10/16 0601) Resp:  [15-21] 15  (10/16 0601) BP: (115-149)/(49-62) 120/49 mmHg (10/16 0601) SpO2:  [96 %-100 %] 100 % (10/16 0601) Weight:  [123 lb 7.3 oz (56 kg)] 123 lb 7.3 oz (56 kg) (10/15 1426)  Intake/Output from previous day: 10/15 0701 - 10/16 0700 In: 120 [P.O.:120] Out: 600 [Urine:600] Intake/Output this shift:    General appearance: alert, cooperative and no distress Resp: clear to auscultation  bilaterally Cardio: regular rate and rhythm, S1, S2 normal, no murmur, click, rub or gallop GI: soft, non-tender; bowel sounds normal; no masses,  no organomegaly Extremities: extremities normal, atraumatic, no cyanosis or edema  Lab Results:   [facility] 12/13/11 0325 12/12/11 0910  WBC 2.4* 2.3*  HGB 8.1* 9.0*  HCT 23.6* 25.6*  PLT 141* 159   BMET  Basename 12/13/11 0325 12/12/11 0910  NA 136 135  K 3.7 3.3*  CL 102 98  CO2 25 27  GLUCOSE 76 104*  BUN 21 26*  CREATININE 0.82 0.90  CALCIUM 8.1* 8.6    Studies/Results: Dg Chest 2 View  12/12/2011  *RADIOLOGY REPORT*  Clinical Data: Lung cancer, weakness post radiation.  CHEST - 2 VIEW  Comparison: 10/11/2011  Findings: Right perihilar mass is difficult to accurately measure however appears smaller in the interval.  There may be secondary necrosis/cavitation.  No new airspace opacity, pneumothorax, or pleural effusion.  Unchanged cardiomediastinal contours otherwise with aortic atherosclerotic calcification.  No acute osseous finding.  IMPRESSION: Decreased size of the right perihilar mass with question of necrosis /cavitation.  Otherwise, no acute change identified.   Original Report Authenticated By: Waneta Martins, M.D.     Medications: I have reviewed the patient's current medications.  Assessment/Plan: This is a very pleasant 78 years old white male with history of stage IIIB non-small cell lung cancer completed a course of concurrent chemoradiation except for a few fractions of radiotherapy. We will hold his radiotherapy for now until the patient has improvement in the radiation induced esophagitis. For  pain management, I will change his pain medication to Dilaudid 2 mg IV every 3 hours as needed. I would also consider starting the patient on viscous lidocaine for the odynophagia. Will continue with IV hydration for now and ask for nutritional consult.   LOS: 1 day    Daizha Anand K. 12/13/2011

## 2011-12-13 NOTE — Progress Notes (Addendum)
[facility] PROGRESS NOTE  Justin Pittman ZOX:[id number] DOB: 1932-11-29 DOA: 12/12/2011 PCP: [name], MD  Brief narrative: Pleasant 78 year old male with history of stage IIIB non-small cell lung cancer, squamous cell carcinoma with right lower lobe lung mass as well as left hilar lymphadenopathy (diagnosed in August of 2013) receiving chemoradiation with weekly carboplatin and paclitaxel, status post 6 weekly doses of treatment last dose was given on 12/05/2011.  Patient presented 12/12/2011 for intractable nausea and vomiting associated with pain and difficulty with swallowing.  Assessment/Plan:  Principal Problem:  *Radiation esophagitis  We will start fluconazole 100 mg daily  Continue Protonix 40 mg IV daily  Continue lidocaine viscous solution  Per family wishes we will start TNA for temporary nutritional support  Active Problems: Non-small cell lung cancer  Appreciate oncology following  Further radiation and chemotherapy on hold  Pancytopenia  Likely sequela of chemotherapy and malignancy  We'll continue to monitor CBC  No signs of active bleed  Anemia of chronic disease  Likely sequela of chemotherapy and malignancy  We'll continue to monitor hemoglobin  Hypokalemia  Repleted  Potassium within normal limits today  Protein calorie malnutrition  Secondary to malignancy and poor oral intake due to radiation esophagitis  We will start TNA for temporary nutritional support  Hypertension  Will start hydralazine 10 mg every 6 hours as needed IV  DVT prophylaxis  Lovenox subcutaneous  Code Status: full code Family Communication: Son and wife at bedside  Disposition Plan: home when stable  Manson Passey, MD  [facility] Pager [phone]  If 7PM-7AM, please contact night-coverage [email or web address] Password [email or web address] 12/13/2011, 7:39 AM   LOS: 1 day   Consultants:  Oncology  Procedures:  None   Antibiotics:  None    HPI/Subjective: No acute events overnight.  Objective: Filed Vitals:   12/12/11 1330 12/12/11 1426 12/12/11 2120 12/13/11 0601  BP: 118/55 139/62 147/62 120/49  Pulse: 80 84 85 72  Temp:  98.1 F (36.7 C) 98.1 F (36.7 C) 98.3 F (36.8 C)  TempSrc:  Oral Oral Oral  Resp: 18 18 16 15   Height:  5\' 10"  (1.778 m)    Weight:  56 kg (123 lb 7.3 oz)    SpO2: 98% 97% 98% 100%    Intake/Output Summary (Last 24 hours) at 12/13/11 0739 Last data filed at 12/13/11 0607  Gross per 24 hour  Intake    120 ml  Output    600 ml  Net   -480 ml    Exam:   General:  Pt is sleeping, appears in no acute distress  Cardiovascular: Regular rate and rhythm, S1/S2 appreciated  Respiratory: Clear to auscultation bilaterally, no wheezing, no crackles, no rhonchi  Abdomen: Soft, non tender, non distended, bowel sounds present, no guarding  Extremities: No edema, pulses DP and PT palpable bilaterally  Neuro: Grossly nonfocal  Data Reviewed: Basic Metabolic Panel:  Lab 12/13/11 8295 12/12/11 0910  NA 136 135  K 3.7 3.3*  CL 102 98  CO2 25 27  GLUCOSE 76 104*  BUN 21 26*  CREATININE 0.82 0.90  CALCIUM 8.1* 8.6   CBC:  Lab 12/13/11 0325 12/12/11 0910  WBC 2.4* 2.3*  HGB 8.1* 9.0*  HCT 23.6* 25.6*  MCV 79.7 78.5  PLT 141* 159    Studies: Dg Chest 2 View 12/12/2011  *  IMPRESSION: Decreased size of the right perihilar mass with question of necrosis /cavitation.  Otherwise, no acute change identified.   Original Report Authenticated By: Greig Castilla  J. DELGAIZO, M.D.    Scheduled Meds:   . enoxaparin (LOVENOX)   40 mg Subcutaneous Q24H  . fentaNYL  25 mcg Intravenous Q4H  . pantoprazole   40 mg Intravenous Q24H

## 2011-12-13 NOTE — Telephone Encounter (Signed)
Concerned that pt needs IV nutrition and said his arm veins shrunk. I talked to wife and told her MD will make decision with them regarding appropriate line for TPN. I explained different VAD used for TPN.SHe said she felt more informed and she appreciated it.

## 2011-12-13 NOTE — Progress Notes (Signed)
INITIAL ADULT NUTRITION ASSESSMENT Date: 12/13/2011   Time: 1:41 PM Reason for Assessment: Nutrition risk, new TPN  INTERVENTION: TPN per pharmacy. Recommend MD consider enteral instead as pt has functioning gut. Will continue to monitor.   Pt meets criteria for severe malnutrition of acute illness AEB <50% estimated energy intake for the past 5 days with 10.8% weight loss in the past month in addition to pt with moderate muscle wasting and subcutaneous fat loss.   ASSESSMENT: Male 78 y.o.  Dx: Radiation esophagitis  Food/Nutrition Related Hx: Pt with stage IIIB non-small cell lung CA getting concurrent chemoradiation s/p 6 weekly doses of radiation. Pt asleep during visit, wife present at bedside. Pt followed by [facility] RD. She reports pt has been unable to eat/drink for the past 3 days r/t pain and radiation esophagitis. She reports before then she was trying to get pt to eat small frequent high calorie/protein meals and drink at least 1 Ensure/day. Pt's weight down 15 pounds unintentionally in the past month. She reports pt without vomiting today, however had some nausea this morning which was resolved with Zofran. She reports being frustrated regarding pt's CA treatment stating "I thought it would make him feel better, not worse". Wife also concerned regarding pt's persistent wet cough which she reports pt has had for years which she thought the CA treatment would fix. Wife attempted to give pt some broth last night, however pt refused. Pt scheduled to start TPN tonight.   Hx:  Past Medical History  Diagnosis Date  . HTN (hypertension)   . Lung cancer     Squamous Cell  . Vertigo 2010    Hospitalized a [facility]  . Hyperlipidemia   . Lung cancer    Related Meds:  Scheduled Meds:   . enoxaparin (LOVENOX) injection  40 mg Subcutaneous Q24H  . fentaNYL  50 mcg Intravenous Once  . fluconazole (DIFLUCAN) IV  100 mg Intravenous Q24H  . insulin aspart  0-15 Units  Subcutaneous Q8H  . pantoprazole (PROTONIX) IV  40 mg Intravenous Q24H  . DISCONTD: sodium chloride   Intravenous STAT  . DISCONTD: fentaNYL  25 mcg Intravenous Q4H   Continuous Infusions:   . sodium chloride 1,000 mL (12/13/11 1020)  . fat emulsion    . TPN (CLINIMIX) +/- additives    . DISCONTD: sodium chloride 125 mL/hr at 12/12/11 1408   PRN Meds:.hydrALAZINE, HYDROmorphone (DILAUDID) injection, lidocaine, ondansetron (ZOFRAN) IV, ondansetron, senna-docusate  Ht: 5\' 10"  (177.8 cm)  Wt: 123 lb 7.3 oz (56 kg)  Ideal Wt: 166 lb % Ideal Wt: 74  Usual Wt: 156 lb % Usual Wt: 79  Wt Readings from Last 10 Encounters:  12/12/11 123 lb 7.3 oz (56 kg)  12/08/11 130 lb 3.2 oz (59.058 kg)  12/06/11 134 lb 3.2 oz (60.873 kg)  12/04/11 133 lb (60.328 kg)  12/01/11 133 lb 11.2 oz (60.646 kg)  11/27/11 134 lb 1.6 oz (60.827 kg)  11/21/11 135 lb 3.2 oz (61.326 kg)  11/21/11 135 lb 4.8 oz (61.372 kg)  11/17/11 136 lb 3.2 oz (61.78 kg)  11/10/11 138 lb 4.8 oz (62.732 kg)     Body mass index is 17.71 kg/(m^2).    Labs:  CMP     Component Value Date/Time   NA 136 12/13/2011 0325   NA 136 12/05/2011 1006   K 3.7 12/13/2011 0325   K 4.1 12/05/2011 1006   CL 102 12/13/2011 0325   CL 101 12/05/2011 1006  CO2 25 12/13/2011 0325   CO2 24 12/05/2011 1006   GLUCOSE 76 12/13/2011 0325   GLUCOSE 100* 12/05/2011 1006   BUN 21 12/13/2011 0325   BUN 24.0 12/05/2011 1006   CREATININE 0.82 12/13/2011 0325   CREATININE 0.9 12/05/2011 1006   CALCIUM 8.1* 12/13/2011 0325   CALCIUM 9.1 12/05/2011 1006   PROT 6.4 12/05/2011 1006   PROT 7.1 10/27/2008 2016   ALBUMIN 3.7 12/05/2011 1006   ALBUMIN 4.2 10/27/2008 2016   AST 14 12/05/2011 1006   AST 20 10/27/2008 2016   ALT 11 12/05/2011 1006   ALT 20 10/27/2008 2016   ALKPHOS 152* 12/05/2011 1006   ALKPHOS 87 10/27/2008 2016   BILITOT 0.70 12/05/2011 1006   BILITOT 0.9 10/27/2008 2016   GFRNONAA 83* 12/13/2011 0325   GFRAA >90 12/13/2011 0325     Intake/Output Summary (Last 24 hours) at 12/13/11 1349 Last data filed at 12/13/11 0915  Gross per 24 hour  Intake    120 ml  Output    800 ml  Net   -680 ml   Last BM - 10/15   Diet Order: General   IVF:    sodium chloride Last Rate: 1,000 mL (12/13/11 1020)  fat emulsion   TPN (CLINIMIX) +/- additives   DISCONTD: sodium chloride Last Rate: 125 mL/hr at 12/12/11 1408    Estimated Nutritional Needs:   Kcal:1950-2300 Protein:85-100g Fluid:1.9-2.3L  NUTRITION DIAGNOSIS: -Inadequate oral intake (NI-2.1).  Status: Ongoing  RELATED TO: radiation esophagitis  AS EVIDENCE BY: 0% meal intake  MONITORING/EVALUATION(Goals): TPN to meet >90% of estimated nutritional needs  EDUCATION NEEDS: -Education needs addressed - used teach back method to educate wife on appropriate diet for esophagitis. Provided handout of this information. Encouraged soft moist foods that are non-acidic, non-spicy, once pt awake and alert and esophagitis improved.    Dietitian #: [phone]  DOCUMENTATION CODES Per approved criteria  -Severe malnutrition in the context of acute illness or injury -Underweight    Marshall Cork 12/13/2011, 1:41 PM

## 2011-12-13 NOTE — Progress Notes (Signed)
OT Cancellation Note  Patient Details Name: Rhodes Calvert MRN: [id number] DOB: 17-Dec-1932   Cancelled Treatment:    Reason Eval/Treat Not Completed: Fatigue/lethargy limiting ability to participate. Pt does not rouse to verbal stim 2* medications. Wife feels it is too soon for pt to participate in therapy.  Akeira Lahm A OTR/L [phone] 12/13/2011, 11:25 AM

## 2011-12-14 ENCOUNTER — Ambulatory Visit: Payer: Medicare Other

## 2011-12-14 DIAGNOSIS — D638 Anemia in other chronic diseases classified elsewhere: Secondary | ICD-10-CM

## 2011-12-14 LAB — COMPREHENSIVE METABOLIC PANEL
AST: 10 U/L (ref 0–37)
BUN: 16 mg/dL (ref 6–23)
CO2: 25 mEq/L (ref 19–32)
Calcium: 7.9 mg/dL — ABNORMAL LOW (ref 8.4–10.5)
Chloride: 101 mEq/L (ref 96–112)
Creatinine, Ser: 0.68 mg/dL (ref 0.50–1.35)
GFR calc Af Amer: >90 mL/min (ref >90)
GFR calc non Af Amer: 90 mL/min — ABNORMAL LOW (ref >90)
Total Bilirubin: 0.3 mg/dL (ref 0.3–1.2)

## 2011-12-14 LAB — CBC
MCH: 28 pg (ref 26.0–34.0)
MCHC: 35.6 g/dL (ref 30.0–36.0)
MCV: 78.7 fL (ref 78.0–100.0)
Platelets: 125 10*3/uL — ABNORMAL LOW (ref 150–400)

## 2011-12-14 LAB — GLUCOSE, CAPILLARY
Glucose-Capillary: 164 mg/dL — ABNORMAL HIGH (ref 70–99)
Glucose-Capillary: 138 mg/dL — ABNORMAL HIGH (ref 70–99)
Glucose-Capillary: 143 mg/dL — ABNORMAL HIGH (ref 70–99)

## 2011-12-14 LAB — PHOSPHORUS: Phosphorus: 1.5 mg/dL — ABNORMAL LOW (ref 2.3–4.6)

## 2011-12-14 LAB — MAGNESIUM: Magnesium: 1.4 mg/dL — ABNORMAL LOW (ref 1.5–2.5)

## 2011-12-14 LAB — CHOLESTEROL, TOTAL: Cholesterol: 129 mg/dL (ref 0–200)

## 2011-12-14 LAB — DIFFERENTIAL
Basophils Relative: 0 % (ref 0–1)
Eosinophils Absolute: 0 10*3/uL (ref 0.0–0.7)
Eosinophils Relative: 0 % (ref 0–5)
Monocytes Relative: 16 % — ABNORMAL HIGH (ref 3–12)
Neutrophils Relative %: 74 % (ref 43–77)

## 2011-12-14 LAB — PREALBUMIN: Prealbumin: 10.8 mg/dL — ABNORMAL LOW (ref 17.0–34.0)

## 2011-12-14 MED ORDER — POTASSIUM CHLORIDE 10 MEQ/100ML IV SOLN
10.0000 meq | INTRAVENOUS | Status: AC
  Administered 2011-12-14 (×3): 10 meq via INTRAVENOUS
  Filled 2011-12-14 (×3): qty 100

## 2011-12-14 MED ORDER — POTASSIUM PHOSPHATE DIBASIC 3 MMOLE/ML IV SOLN
20.0000 mmol | Freq: Once | INTRAVENOUS | Status: AC
  Administered 2011-12-14: 20 mmol via INTRAVENOUS
  Filled 2011-12-14: qty 6.67

## 2011-12-14 MED ORDER — SODIUM CHLORIDE 0.9 % IV SOLN
INTRAVENOUS | Status: DC
  Administered 2011-12-14 – 2011-12-18 (×5): via INTRAVENOUS
  Administered 2011-12-18: 100 mL/h via INTRAVENOUS

## 2011-12-14 MED ORDER — LIDOCAINE VISCOUS 2 % MT SOLN
20.0000 mL | OROMUCOSAL | Status: DC
  Administered 2011-12-14 – 2011-12-16 (×6): 20 mL via OROMUCOSAL
  Administered 2011-12-16: 15 mL via OROMUCOSAL
  Administered 2011-12-16 – 2011-12-18 (×5): 20 mL via OROMUCOSAL
  Filled 2011-12-14 (×30): qty 20

## 2011-12-14 MED ORDER — MAGNESIUM SULFATE 40 MG/ML IJ SOLN
2.0000 g | Freq: Once | INTRAMUSCULAR | Status: AC
  Administered 2011-12-14: 2 g via INTRAVENOUS
  Filled 2011-12-14: qty 50

## 2011-12-14 MED ORDER — K PHOS MONO-SOD PHOS DI & MONO 155-852-130 MG PO TABS
250.0000 mg | ORAL_TABLET | Freq: Every day | ORAL | Status: DC
  Administered 2011-12-14 – 2011-12-28 (×13): 250 mg via ORAL
  Filled 2011-12-14 (×15): qty 1

## 2011-12-14 MED ORDER — CLINIMIX E/DEXTROSE (5/20) 5 % IV SOLN
INTRAVENOUS | Status: AC
  Administered 2011-12-14: 17:00:00 via INTRAVENOUS
  Filled 2011-12-14: qty 1000

## 2011-12-14 NOTE — Progress Notes (Signed)
Subjective: The patient is seen and examined. His wife was at the bedside. He is feeling a little bit better today. His pain is better controlled with Dilaudid. He is currently on TNA for nutrition. He has no fever or chills. He has no nausea or vomiting. He continues to have dysphagia and odynophagia.  Objective: Vital signs in last 24 hours: Temp:  [97.6 F (36.4 C)-98.3 F (36.8 C)] 98.3 F (36.8 C) (10/17 0641) Pulse Rate:  [75-83] 83  (10/17 0641) Resp:  [16-17] 16  (10/17 0641) BP: (126-148)/(52-66) 148/66 mmHg (10/17 0641) SpO2:  [96 %-100 %] 98 % (10/17 0641) Weight:  [135 lb (61.236 kg)] 135 lb (61.236 kg) (10/17 0641)  Intake/Output from previous day: 10/16 0701 - 10/17 0700 In: 120 [P.O.:120] Out: 450 [Urine:450] Intake/Output this shift: Total I/O In: 120 [P.O.:120] Out: 150 [Urine:150]  General appearance: alert, cooperative, fatigued and no distress Resp: clear to auscultation bilaterally Cardio: regular rate and rhythm, S1, S2 normal, no murmur, click, rub or gallop GI: soft, non-tender; bowel sounds normal; no masses,  no organomegaly Extremities: extremities normal, atraumatic, no cyanosis or edema  Lab Results:   [facility] 12/14/11 0640 12/13/11 0325  WBC 2.8* 2.4*  HGB 7.9* 8.1*  HCT 22.2* 23.6*  PLT 125* 141*   BMET  Basename 12/14/11 0640 12/13/11 0325  NA 134* 136  K 3.2* 3.7  CL 101 102  CO2 25 25  GLUCOSE 126* 76  BUN 16 21  CREATININE 0.68 0.82  CALCIUM 7.9* 8.1*    Studies/Results: No results found.  Medications: I have reviewed the patient's current medications.  Assessment/Plan: This is a very pleasant 78 years old white male with history of stage IIIB non-small cell lung cancer status post concurrent chemoradiation for 6 weeks. The patient is feeling better today with less pain. His dysphagia and odynophagia secondary to radiation induced esophagitis. I advised him to use the viscous lidocaine an as-needed basis. We'll continue  with TNA for now. We'll continue to hold radiation until improvement in his condition.  LOS: 2 days    Katelind Pytel K. 12/14/2011

## 2011-12-14 NOTE — Progress Notes (Signed)
OT Cancellation Note  Patient Details Name: Justin Pittman MRN: [id number] DOB: Dec 06, 1932   Cancelled Treatment:    Reason Eval/Treat Not Completed: Pain limiting ability to participate;Other (comment) (sleepy from pain meds)  Will reattempt tomorrow.  [address] OTR/L [phone] 12/14/2011 12/14/2011, 1:36 PM

## 2011-12-14 NOTE — Progress Notes (Signed)
[facility] PROGRESS NOTE  Justin Pittman JYN:[id number] DOB: 09/19/1932 DOA: 12/12/2011 PCP: [name], MD  Brief narrative: Pleasant 78 year old male with history of stage IIIB non-small cell lung cancer, squamous cell carcinoma with right lower lobe lung mass as well as left hilar lymphadenopathy (diagnosed in August of 2013) receiving chemoradiation with weekly carboplatin and paclitaxel, status post 6 weekly doses of treatment last dose was given on 12/05/2011. Patient presented 12/12/2011 for intractable nausea and vomiting associated with pain and difficulty with swallowing. Patient is started on TNA for temporary nutritional support.  Assessment/Plan:   Principal Problem:  *Radiation esophagitis  We will continue  fluconazole 100 mg daily  Continue Protonix 40 mg IV daily  Continue lidocaine viscous solution , changed to Q 4 hours scheduled at least for today  Active Problems:  Non-small cell lung cancer  Appreciate oncology following  Further radiation and chemotherapy on hold Pancytopenia  Likely sequela of chemotherapy and malignancy  We'll continue to monitor CBC  No signs of active bleed Platelet count further down so we have discontinued Lovenox and started SCD for DVT prophylaxis Anemia of chronic disease  Likely sequela of chemotherapy and malignancy  Hemoglobin today 7.9 We will transfuse 1 unit PRBC today Hypokalemia/ Hypomagnesemia Repleted today again (potassium and magnesium) Follow up BMP and Magnesium level in am Protein calorie malnutrition  Secondary to malignancy and poor oral intake due to radiation esophagitis  On TNA Hypertension  Hydralazine 10 mg every 6 hours as needed IV DVT prophylaxis  Lovenox subcutaneous D/Ced due to thrombocytopenia Started SCD bialterally  Code Status: full code  Family Communication: Son and wife at bedside  Disposition Plan: home when stable   Manson Passey, MD  [facility]  Pager [phone]  If 7PM-7AM,  please contact night-coverage [email or web address] Password [email or web address] 12/14/2011, 10:22 AM   LOS: 2 days   HPI/Subjective: Feels better today.  Objective: Filed Vitals:   12/13/11 1225 12/13/11 1425 12/13/11 2237 12/14/11 0641  BP:  143/63 126/52 148/66  Pulse:  80 75 83  Temp:  97.6 F (36.4 C) 97.8 F (36.6 C) 98.3 F (36.8 C)  TempSrc:  Oral Oral Oral  Resp:  17 16 16   Height:      Weight:    61.236 kg (135 lb)  SpO2: 97% 100% 96% 98%    Intake/Output Summary (Last 24 hours) at 12/14/11 1022 Last data filed at 12/13/11 2300  Gross per 24 hour  Intake    120 ml  Output    250 ml  Net   -130 ml    Exam:   General:  Pt is alert, follows commands appropriately, not in acute distress; cachectic  Cardiovascular: Regular rate and rhythm, S1/S2, no murmurs, no rubs, no gallops  Respiratory: Clear to auscultation bilaterally, no wheezing, no crackles, no rhonchi  Abdomen: Soft, non tender, non distended, bowel sounds present, no guarding  Extremities: No edema, pulses DP and PT palpable bilaterally  Neuro: Grossly nonfocal  Data Reviewed: Basic Metabolic Panel:  Lab 12/14/11 9629 12/13/11 0325 12/12/11 0910  NA 134* 136 135  K 3.2* 3.7 3.3*  CL 101 102 98  CO2 25 25 27   GLUCOSE 126* 76 104*  BUN 16 21 26*  CREATININE 0.68 0.82 0.90  CALCIUM 7.9* 8.1* 8.6  MG 1.4* -- --  PHOS 1.5* -- --   Liver Function Tests:  Lab 12/14/11 0640  AST 10  ALT 8  ALKPHOS 80  BILITOT 0.3  PROT 5.1*  ALBUMIN  2.6*   CBC:  Lab 12/14/11 0640 12/13/11 0325 12/12/11 0910  WBC 2.8* 2.4* 2.3*  HGB 7.9* 8.1* 9.0*  HCT 22.2* 23.6* 25.6*  MCV 78.7 79.7 78.5  PLT 125* 141* 159   CBG:  Lab 12/14/11 1010 12/14/11 0159 12/13/11 2236  GLUCAP 138* 164* 159*    Studies: No results found.  Scheduled Meds:  . fluconazole (DIFLUCAN)   100 mg Intravenous Q24H  . insulin aspart  0-15 Units Subcutaneous Q8H  . magnesium sulfate 1 gm  2 g Intravenous Once  . pantoprazole   40 mg  Intravenous Q24H  . potassium chloride  10 mEq Intravenous Q1 Hr x 3   Continuous Infusions:  . fat emulsion 250 mL (12/13/11 1822)  . TPN (CLINIMIX) +/- additives 40 mL/hr at 12/13/11 1822  . TPN (CLINIMIX) +/- additives

## 2011-12-14 NOTE — Progress Notes (Signed)
PARENTERAL NUTRITION CONSULT NOTE - Follow Up  Pharmacy Consult for TNA Indication: protein calorie malnutrition   No Known Allergies  Patient Measurements: Height: 5\' 10"  (177.8 cm) Weight: 135 lb (61.236 kg) IBW/kg (Calculated) : 73   Vital Signs: Temp: 98.3 F (36.8 C) (10/17 0641) Temp src: Oral (10/17 0641) BP: 148/66 mmHg (10/17 0641) Pulse Rate: 83  (10/17 0641) Intake/Output from previous day: 10/16 0701 - 10/17 0700 In: 120 [P.O.:120] Out: 450 [Urine:450] Intake/Output from this shift:    Labs:  [facility] 12/14/11 0640 12/13/11 0325 12/12/11 0910  WBC 2.8* 2.4* 2.3*  HGB 7.9* 8.1* 9.0*  HCT 22.2* 23.6* 25.6*  PLT 125* 141* 159  APTT -- -- --  INR -- -- --     Basename 12/14/11 0640 12/13/11 0325 12/12/11 0910  NA 134* 136 135  K 3.2* 3.7 3.3*  CL 101 102 98  CO2 25 25 27   GLUCOSE 126* 76 104*  BUN 16 21 26*  CREATININE 0.68 0.82 0.90  LABCREA -- -- --  CREAT24HRUR -- -- --  CALCIUM 7.9* 8.1* 8.6  MG 1.4* -- --  PHOS 1.5* -- --  PROT 5.1* -- --  ALBUMIN 2.6* -- --  AST 10 -- --  ALT 8 -- --  ALKPHOS 80 -- --  BILITOT 0.3 -- --  BILIDIR -- -- --  IBILI -- -- --  PREALBUMIN -- -- --  TRIG -- -- --  CHOLHDL -- -- --  CHOL -- -- --   Estimated Creatinine Clearance: 66.9 ml/min (by C-G formula based on Cr of 0.68).    Basename 12/14/11 0159 12/13/11 2236  GLUCAP 164* 159*    Medical History: Past Medical History  Diagnosis Date  . HTN (hypertension)   . Lung cancer     Squamous Cell  . Vertigo 2010    Hospital[facility] a [facility]  . Hyperlipidemia   . Lung cancer     Medications:  Prescriptions prior to admission  Medication Sig Dispense Refill  . famotidine (PEPCID) 20 MG tablet Take 20 mg by mouth at bedtime.      Marland Kitchen KLOR-CON M10 10 MEQ tablet Take 1 tablet by mouth daily.       Marland Kitchen omeprazole (PRILOSEC) 20 MG capsule Take 20 mg by mouth daily before breakfast.      . prochlorperazine (COMPAZINE) 10 MG tablet Take 10 mg by mouth every 6  (six) hours as needed. nausea      . sucralfate (CARAFATE) 1 G tablet Take 1 g by mouth 4 (four) times daily. Dissolve 1 tab. In water qid ac 5 min. Before food prn discomfort.Disp. # 60 w/ 3 refills Called into [facility] [address].       Scheduled:     . fluconazole (DIFLUCAN) IV  100 mg Intravenous Q24H  . insulin aspart  0-15 Units Subcutaneous Q8H  . pantoprazole (PROTONIX) IV  40 mg Intravenous Q24H  . sodium chloride  10-40 mL Intracatheter Q12H  . DISCONTD: enoxaparin (LOVENOX) injection  40 mg Subcutaneous Q24H  . DISCONTD: fentaNYL  25 mcg Intravenous Q4H   Infusions:     . sodium chloride 125 mL/hr at 12/14/11 0315  . fat emulsion 250 mL (12/13/11 1822)  . TPN (CLINIMIX) +/- additives 40 mL/hr at 12/13/11 1822    Insulin Requirements in the past 24 hours:  0  Current Nutrition:  Regular Diet ordered - 10% of meals eaten NS @ 125 ml/hr  TNA E 5/20 currently at 40 ml/hr  Assessment: 78 YO M with h/o stage IIIB NSCLC s/p chemoradiation. Last chemotherapy was given on 10/8. Patient presented 10/15 with nausea, vomiting, pain and difficulty with swallowing. Patient starting TNA for poor oral intake and protein calorie malnutrition.  Labs:  Lytes:  Na, K, Mag, Phos all low - ? Refeeding syndrome - will replete  Renal function WNL  Prealb and TG pending   Nutritional Goals:  Per RD: Kcal - 1950 - 2300 Protein - 85 - 100g Fluid = 1.9 - 2.3 L  Goal: Clinimix E5/20 at a goal rate of 80 ml/hr with lipids MWF will provide 96 g protein daily and 1689 kcal TTSS, 2169 kcal MWF, and an average of 1790 kcal/day.   Plan:  1. Continue Clinimix E5/20 at a rate of 40 ml/hr due to potential refeeding syndrome with electrolyte imbalances - once lytes have normalized or begin improving will start to advance TNA rate toward goal at that point 2. Replete K, Phos and Mag - follow up in AM 3. Begin lipids at 10 ml/hr, lipids MWF (d/t shortage) 4. Trace elements and MVI  MWF (d/t shortage) 5. TNA labs Mondays and Thursdays 6. CBGs and moderate SSI every 8 hours   Hessie Knows, PharmD, BCPS Pager [phone] 12/14/2011 8:37 AM

## 2011-12-14 NOTE — Evaluation (Signed)
Physical Therapy Evaluation Patient Details Name: Justin Pittman MRN: [id number] DOB: 04/13/1932 Today's Date: 12/14/2011 Time: 0905-0920 PT Time Calculation (min): 15 min  PT Assessment / Plan / Recommendation Clinical Impression  78 year old male with history of stage IIIB non-small cell lung cancer, squamous cell carcinoma with right lower lobe lung mass as well as left hilar lymphadenopathy (diagnosed in August of 2013) ; pt will benefit from PT to maximize independence for next venue of care    PT Assessment  Patient needs continued PT services    Follow Up Recommendations  Home health PT;Supervision - Intermittent    Does the patient have the potential to tolerate intense rehabilitation      Barriers to Discharge        Equipment Recommendations  Rolling walker with 5" wheels    Recommendations for Other Services     Frequency Min 3X/week    Precautions / Restrictions Precautions Precautions: Fall   Pertinent Vitals/Pain       Mobility  Bed Mobility Bed Mobility: Supine to Sit Supine to Sit: 4: Min assist Details for Bed Mobility Assistance: increased time,  and max emcouragement Transfers Transfers: Sit to Stand;Stand to Sit;Stand Pivot Transfers Sit to Stand: 4: Min assist;With upper extremity assist Stand to Sit: 4: Min assist;To chair/3-in-1;With upper extremity assist Stand Pivot Transfers: 3: Mod assist Details for Transfer Assistance: multi-modal cues for hand placement and RW safety during pivot transfer; increased time and max emcourgagement; pt with shuffling gait during transfer    Shoulder Instructions     Exercises     PT Diagnosis: Difficulty walking  PT Problem List: Decreased range of motion;Decreased activity tolerance;Decreased balance;Decreased mobility;Decreased knowledge of use of DME PT Treatment Interventions: DME instruction;Gait training;Stair training;Functional mobility training;Therapeutic activities;Therapeutic exercise;Balance  training;Patient/family education   PT Goals Acute Rehab PT Goals PT Goal Formulation: With patient Time For Goal Achievement: 12/28/11 Potential to Achieve Goals: Good Pt will go Supine/Side to Sit: with supervision PT Goal: Supine/Side to Sit - Progress: Goal set today Pt will go Sit to Stand: with supervision PT Goal: Sit to Stand - Progress: Goal set today Pt will go Stand to Sit: with supervision PT Goal: Stand to Sit - Progress: Goal set today Pt will Ambulate: 16 - 50 feet;with rolling walker;with min assist PT Goal: Ambulate - Progress: Goal set today  Visit Information  Last PT Received On: 12/14/11 Assistance Needed: +1    Subjective Data  Subjective: the bed feels good Patient Stated Goal: none stated, pt says he wants to rest   Prior Functioning  Home Living Lives With: Spouse Available Help at Discharge: Family;Available PRN/intermittently Type of Home: House Home Access: Stairs to enter [employer] of Steps: 1 small step/threhsold Entrance Stairs-Rails: None Home Layout: One level Home Adaptive Equipment: None Prior Function Level of Independence: Independent Able to Take Stairs?: Yes Driving: Yes Communication Communication: No difficulties    Cognition  Overall Cognitive Status: Appears within functional limits for tasks assessed/performed Arousal/Alertness: Awake/alert Orientation Level: Appears intact for tasks assessed Behavior During Session: [facility] for tasks performed    Extremity/Trunk Assessment Right Upper Extremity Assessment RUE ROM/Strength/Tone: [facility] for tasks assessed Left Upper Extremity Assessment LUE ROM/Strength/Tone: WFL for tasks assessed Right Lower Extremity Assessment RLE ROM/Strength/Tone: [facility] for tasks assessed Left Lower Extremity Assessment LLE ROM/Strength/Tone: [facility] for tasks assessed, rapid muscle fatigue and diffuse muscle atrophy per  obs  Balance Static Standing Balance Static Standing - Balance Support:  Bilateral upper extremity supported;During functional activity  Static Standing - Level of Assistance: 4: Min assist;3: Mod assist  End of Session PT - End of Session Equipment Utilized During Treatment: Gait belt Activity Tolerance: Other (comment) (pt limited by his unwillingness to participate further) Patient left: in chair;with call bell/phone within reach;with family/visitor present  GP     [facility] 12/14/2011, 12:16 PM

## 2011-12-15 ENCOUNTER — Ambulatory Visit: Payer: Medicare Other

## 2011-12-15 LAB — CBC
HCT: 24.2 % — ABNORMAL LOW (ref 39.0–52.0)
Hemoglobin: 8.7 g/dL — ABNORMAL LOW (ref 13.0–17.0)
MCH: 28.1 pg (ref 26.0–34.0)
MCHC: 36 g/dL (ref 30.0–36.0)
MCV: 78.1 fL (ref 78.0–100.0)
Platelets: 118 K/uL — ABNORMAL LOW (ref 150–400)
RBC: 3.1 MIL/uL — ABNORMAL LOW (ref 4.22–5.81)
RDW: 16.1 % — ABNORMAL HIGH (ref 11.5–15.5)
WBC: 2.4 K/uL — ABNORMAL LOW (ref 4.0–10.5)

## 2011-12-15 LAB — COMPREHENSIVE METABOLIC PANEL
Albumin: 2.5 g/dL — ABNORMAL LOW (ref 3.5–5.2)
Alkaline Phosphatase: 77 U/L (ref 39–117)
BUN: 11 mg/dL (ref 6–23)
Chloride: 97 mEq/L (ref 96–112)
Creatinine, Ser: 0.63 mg/dL (ref 0.50–1.35)
GFR calc Af Amer: >90 mL/min (ref >90)
GFR calc non Af Amer: >90 mL/min (ref >90)
Glucose, Bld: 111 mg/dL — ABNORMAL HIGH (ref 70–99)
Potassium: 3.1 mEq/L — ABNORMAL LOW (ref 3.5–5.1)
Total Bilirubin: 0.6 mg/dL (ref 0.3–1.2)

## 2011-12-15 LAB — MAGNESIUM: Magnesium: 1.4 mg/dL — ABNORMAL LOW (ref 1.5–2.5)

## 2011-12-15 LAB — PHOSPHORUS: Phosphorus: 2.3 mg/dL (ref 2.3–4.6)

## 2011-12-15 LAB — GLUCOSE, CAPILLARY: Glucose-Capillary: 114 mg/dL — ABNORMAL HIGH (ref 70–99)

## 2011-12-15 MED ORDER — MAGNESIUM SULFATE 40 MG/ML IJ SOLN
2.0000 g | Freq: Once | INTRAMUSCULAR | Status: AC
  Administered 2011-12-15: 2 g via INTRAVENOUS
  Filled 2011-12-15: qty 50

## 2011-12-15 MED ORDER — POTASSIUM CHLORIDE 10 MEQ/100ML IV SOLN
10.0000 meq | INTRAVENOUS | Status: AC
  Administered 2011-12-15 (×4): 10 meq via INTRAVENOUS
  Filled 2011-12-15 (×4): qty 100

## 2011-12-15 MED ORDER — FAT EMULSION 20 % IV EMUL
250.0000 mL | INTRAVENOUS | Status: AC
  Administered 2011-12-15: 250 mL via INTRAVENOUS
  Filled 2011-12-15: qty 250

## 2011-12-15 MED ORDER — ZINC TRACE METAL 1 MG/ML IV SOLN
INTRAVENOUS | Status: AC
  Administered 2011-12-15: 17:00:00 via INTRAVENOUS
  Filled 2011-12-15: qty 1000

## 2011-12-15 NOTE — Progress Notes (Signed)
[facility] PROGRESS NOTE  Justin Pittman ZOX:[id number] DOB: 1933/01/17 DOA: 12/12/2011 PCP: [name], MD  Brief narrative: 78 year old male with history of stage IIIB non-small cell lung cancer, squamous cell carcinoma with right lower lobe lung mass as well as left hilar lymphadenopathy (diagnosed in August of 2013) receiving chemoradiation with weekly carboplatin and paclitaxel, status post 6 weekly doses of treatment last dose was given on 12/05/2011. Patient presented 12/12/2011 for intractable nausea and vomiting associated with pain and difficulty with swallowing. Patient is started on TNA for temporary nutritional support.   Assessment/Plan:   Principal Problem:  *Radiation esophagitis  Continue fluconazole 100 mg daily  Continue Protonix 40 mg IV daily  Continue lidocaine viscous solution , changed to Q 4 hours scheduled at least for today Continue TNA  Active Problems:  Non-small cell lung cancer  Appreciate oncology following  Further radiation and chemotherapy on hold Pancytopenia  Likely secondary to sequela of chemotherapy and malignancy  No signs of active bleed  Platelet count further down to 118 Anemia of chronic disease  Likely sequela of chemotherapy and malignancy  Hemoglobin today 8.7 status post 1 unit PRBC done 10/17 Hypokalemia/ Hypomagnesemia  Repleted today again (potassium and magnesium)  Follow up BMP and Magnesium level in am Phosphate level is WNL Protein calorie malnutrition  Secondary to malignancy and poor oral intake due to radiation esophagitis  On TNA Hypertension  Hydralazine 10 mg every 6 hours as needed IV DVT prophylaxis  Lovenox subcutaneous D/Ced due to thrombocytopenia  Continue SCD bialterally  Code Status: full code  Family Communication: Son and wife at bedside updated 12/14/2011 Disposition Plan: home when stable   Manson Passey, MD  [facility]  Pager [phone]   If 7PM-7AM, please contact  night-coverage [email or web address] Password [email or web address] 12/15/2011, 2:22 PM   LOS: 3 days   HPI/Subjective: Still complains of difficulty swallowing.  Objective: Filed Vitals:   12/15/11 0330 12/15/11 0410 12/15/11 0635 12/15/11 1336  BP: 128/65 121/65 145/78 157/79  Pulse:   108 99  Temp: 98 F (36.7 C) 98.4 F (36.9 C) 98 F (36.7 C) 97.9 F (36.6 C)  TempSrc:   Oral Oral  Resp: 16  16 19   Height:      Weight:   61.598 kg (135 lb 12.8 oz)   SpO2: 97%  98% 100%    Intake/Output Summary (Last 24 hours) at 12/15/11 1422 Last data filed at 12/15/11 1336  Gross per 24 hour  Intake  192.5 ml  Output   1500 ml  Net -1307.5 ml    Exam:   General:  Pt is alert, follows commands appropriately, not in acute distress  Cardiovascular: Regular rate and rhythm, S1/S2, no murmurs, no rubs, no gallops  Respiratory: Clear to auscultation bilaterally, no wheezing, no crackles, no rhonchi  Abdomen: Soft, non tender, non distended, bowel sounds present, no guarding  Extremities: No edema, pulses DP and PT palpable bilaterally  Neuro: Grossly nonfocal  Data Reviewed: Basic Metabolic Panel:  Lab 12/15/11 8295 12/14/11 0640 12/13/11 0325 12/12/11 0910  NA 131* 134* 136 135  K 3.1* 3.2* 3.7 3.3*  CL 97 101 102 98  CO2 27 25 25 27   GLUCOSE 111* 126* 76 104*  BUN 11 16 21  26*  CREATININE 0.63 0.68 0.82 0.90  CALCIUM 7.8* 7.9* 8.1* 8.6  MG 1.4* 1.4* -- --  PHOS 2.3 1.5* -- --   Liver Function Tests:  Lab 12/15/11 0650 12/14/11 0640  AST 12 10  ALT  7 8  ALKPHOS 77 80  BILITOT 0.6 0.3  PROT 5.0* 5.1*  ALBUMIN 2.5* 2.6*   No results found for this basename: LIPASE:5,AMYLASE:5 in the last 168 hours No results found for this basename: AMMONIA:5 in the last 168 hours CBC:  Lab 12/15/11 0650 12/14/11 0640 12/13/11 0325 12/12/11 0910  WBC 2.4* 2.8* 2.4* 2.3*  NEUTROABS -- 2.0 -- 1.7  HGB 8.7* 7.9* 8.1* 9.0*  HCT 24.2* 22.2* 23.6* 25.6*  MCV 78.1 78.7 79.7 78.5  PLT 118* 125*  141* 159   Cardiac Enzymes: No results found for this basename: CKTOTAL:5,CKMB:5,CKMBINDEX:5,TROPONINI:5 in the last 168 hours BNP: No components found with this basename: POCBNP:5 CBG:  Lab 12/15/11 0220 12/14/11 1803 12/14/11 1010 12/14/11 0159 12/13/11 2236  GLUCAP 114* 143* 138* 164* 159*    No results found for this or any previous visit (from the past 240 hour(s)).   Studies: No results found.  Scheduled Meds:   . fluconazole (DIFLUCAN) IV  100 mg Intravenous Q24H  . insulin aspart  0-15 Units Subcutaneous Q8H  . lidocaine  20 mL Mouth/Throat Q4H  . magnesium sulfate 1 - 4 g bolus IVPB  2 g Intravenous Once  . magnesium sulfate 1 - 4 g bolus IVPB  2 g Intravenous Once  . pantoprazole (PROTONIX) IV  40 mg Intravenous Q24H  . phosphorus  250 mg Oral Daily  . potassium chloride  10 mEq Intravenous Q1 Hr x 4  . potassium phosphate IVPB (mmol)  20 mmol Intravenous Once  . sodium chloride  10-40 mL Intracatheter Q12H   Continuous Infusions:   . sodium chloride 100 mL/hr at 12/14/11 1538  . fat emulsion 250 mL (12/13/11 1822)  . fat emulsion    . TPN (CLINIMIX) +/- additives 40 mL/hr at 12/13/11 1822  . TPN (CLINIMIX) +/- additives 40 mL/hr at 12/14/11 1712  . TPN (CLINIMIX) +/- additives

## 2011-12-15 NOTE — Progress Notes (Signed)
PARENTERAL NUTRITION CONSULT NOTE - Follow Up  Pharmacy Consult for TNA Indication: protein calorie malnutrition   No Known Allergies  Patient Measurements: Height: 5\' 10"  (177.8 cm) Weight: 135 lb 12.8 oz (61.598 kg) IBW/kg (Calculated) : 73   Vital Signs: Temp: 98 F (36.7 C) (10/18 0635) Temp src: Oral (10/18 0635) BP: 145/78 mmHg (10/18 0635) Pulse Rate: 108  (10/18 0635) Intake/Output from previous day: 10/17 0701 - 10/18 0700 In: 192.5 [P.O.:180; Blood:12.5] Out: 1200 [Urine:1200] Intake/Output from this shift: Total I/O In: -  Out: 150 [Urine:150]  Labs:  [facility] 12/15/11 0650 12/14/11 0640 12/13/11 0325  WBC 2.4* 2.8* 2.4*  HGB 8.7* 7.9* 8.1*  HCT 24.2* 22.2* 23.6*  PLT 118* 125* 141*  APTT -- -- --  INR -- -- --     Basename 12/15/11 0650 12/14/11 0640 12/13/11 0325  NA 131* 134* 136  K 3.1* 3.2* 3.7  CL 97 101 102  CO2 27 25 25   GLUCOSE 111* 126* 76  BUN 11 16 21   CREATININE 0.63 0.68 0.82  LABCREA -- -- --  CREAT24HRUR -- -- --  CALCIUM 7.8* 7.9* 8.1*  MG 1.4* 1.4* --  PHOS 2.3 1.5* --  PROT 5.0* 5.1* --  ALBUMIN 2.5* 2.6* --  AST 12 10 --  ALT 7 8 --  ALKPHOS 77 80 --  BILITOT 0.6 0.3 --  BILIDIR -- -- --  IBILI -- -- --  PREALBUMIN -- 10.8* --  TRIG -- 87 --  CHOLHDL -- -- --  CHOL -- 129 --   Estimated Creatinine Clearance: 67.4 ml/min (by C-G formula based on Cr of 0.63).    Basename 12/15/11 0220 12/14/11 1803 12/14/11 1010  GLUCAP 114* 143* 138*    Medical History: Past Medical History  Diagnosis Date  . HTN (hypertension)   . Lung cancer     Squamous Cell  . Vertigo 2010    Hospitalized a [facility]  . Hyperlipidemia   . Lung cancer     Medications:  Prescriptions prior to admission  Medication Sig Dispense Refill  . famotidine (PEPCID) 20 MG tablet Take 20 mg by mouth at bedtime.      Marland Kitchen KLOR-CON M10 10 MEQ tablet Take 1 tablet by mouth daily.       Marland Kitchen omeprazole (PRILOSEC) 20 MG capsule Take 20 mg by mouth daily  before breakfast.      . prochlorperazine (COMPAZINE) 10 MG tablet Take 10 mg by mouth every 6 (six) hours as needed. nausea      . sucralfate (CARAFATE) 1 G tablet Take 1 g by mouth 4 (four) times daily. Dissolve 1 tab. In water qid ac 5 min. Before food prn discomfort.Disp. # 60 w/ 3 refills Called into [facility] [address].       Scheduled:     . fluconazole (DIFLUCAN) IV  100 mg Intravenous Q24H  . insulin aspart  0-15 Units Subcutaneous Q8H  . lidocaine  20 mL Mouth/Throat Q4H  . magnesium sulfate 1 - 4 g bolus IVPB  2 g Intravenous Once  . pantoprazole (PROTONIX) IV  40 mg Intravenous Q24H  . phosphorus  250 mg Oral Daily  . potassium chloride  10 mEq Intravenous Q1 Hr x 3  . potassium phosphate IVPB (mmol)  20 mmol Intravenous Once  . sodium chloride  10-40 mL Intracatheter Q12H   Infusions:     . sodium chloride 100 mL/hr at 12/14/11 1538  . fat emulsion 250 mL (12/13/11 1822)  .  TPN (CLINIMIX) +/- additives 40 mL/hr at 12/13/11 1822  . TPN (CLINIMIX) +/- additives 40 mL/hr at 12/14/11 1712    Insulin Requirements in the past 24 hours:  CBG's < 150  2 units SSI used   Current Nutrition:  Regular Diet ordered - 0% of meals eaten NS @ 125 ml/hr  TNA E 5/20 currently at 40 ml/hr   Assessment: 78 YO M with h/o stage IIIB NSCLC s/p chemoradiation. Last chemotherapy was given on 10/8. Patient presented 10/15 with nausea, vomiting, pain and difficulty with swallowing.  Patient continues to have dysphagia and odynophagia secondary to radiation induced esophagitis and will continue on TNA to help with nutritional status.  Labs:  Lytes:  Potassium and magnesium remain low- ? Refeeding syndrome - will replete again today  Phos WNL today after IV replacement yesterday, and started on K Phos Neutral 250 mg daily on 10/17  Na low today (131) but cannot adjust in TNA  Renal function WNL  TG WNL (10/17)  Pre albumin low 10.8 (10/17)   Nutritional Goals:  Per  RD: Kcal - 1950 - 2300 Protein - 85 - 100g Fluid = 1.9 - 2.3 L  Goal: Clinimix E5/20 at a goal rate of 80 ml/hr with lipids MWF will provide 96 g protein daily and 1689 kcal TTSS, 2169 kcal MWF, and an average of 1790 kcal/day.   Plan:  1. Continue Clinimix E5/20 at a rate of 40 ml/hr due to potential refeeding syndrome with electrolyte imbalances - once lytes have normalized or begin improving will start to advance TNA rate toward goal at that point 2. Repeat magnesium 2 gram IV x 1 today  3. Potassium 10 mEq IV x 4 runs for a total of 40 mEq's of potassium today  4. Begin lipids at 10 ml/hr, lipids MWF (d/t shortage) 5. Trace elements and MVI MWF (d/t shortage) 6. TNA labs Mondays and Thursdays 7. CBGs and moderate SSI every 8 hours  Kaysie Michelini, Loma Messing PharmD Pager #: [phone] 10:12 AM 12/15/2011

## 2011-12-15 NOTE — Evaluation (Signed)
Occupational Therapy Evaluation Patient Details Name: Justin Pittman MRN: [id number] DOB: Jun 10, 1932 Today's Date: 12/15/2011 Time: 4098-1191 OT Time Calculation (min): 13 min  OT Assessment / Plan / Recommendation Clinical Impression  This 78 year old man was admitted for radiation esophagitis.  Prior to admission, pt was getting weaker than baseline.  At baseline, pt is independent with all adls.  He will benefit from skilled OT to increase strength and endurance with mod I level goals in acute.      OT Assessment  Patient needs continued OT Services    Follow Up Recommendations  No OT follow up (likely)    Barriers to Discharge      Equipment Recommendations  Rolling walker with 5" wheels    Recommendations for Other Services    Frequency  Min 2X/week    Precautions / Restrictions Precautions Precautions: Fall Restrictions Weight Bearing Restrictions: No   Pertinent Vitals/Pain A little pain in esophagus:  He did not need to call for pain meds Pt reports he was able to eat better this am.    ADL  Eating/Feeding: Simulated;Independent Where Assessed - Eating/Feeding: Bed level Grooming: Simulated;Set up Where Assessed - Grooming: Unsupported sitting Upper Body Bathing: Simulated;Set up Where Assessed - Upper Body Bathing: Unsupported sitting Lower Body Bathing: Simulated;Min guard Where Assessed - Lower Body Bathing: Supported sit to stand Upper Body Dressing: Simulated;Minimal assistance (lines) Where Assessed - Upper Body Dressing: Unsupported sitting Lower Body Dressing: Performed;Min guard (set up for socks) Where Assessed - Lower Body Dressing: Supported sit to Statistician and Hygiene: Simulated;Min guard Where Assessed - Engineer, mining and Hygiene: Sit to stand from 3-in-1 or toilet Transfers/Ambulation Related to ADLs: sit to stand only--min guard; mod I for bed mobility with rail ADL Comments: Began energy  conservation education.  Pt hasn't needed to sit in shower before but he can use wife's shower seat, if needed.     OT Diagnosis: Generalized weakness  OT Problem List: Decreased strength;Decreased activity tolerance;Impaired balance (sitting and/or standing);Decreased knowledge of use of DME or AE OT Treatment Interventions: Self-care/ADL training;DME and/or AE instruction;Patient/family education;Balance training;Energy conservation   OT Goals Acute Rehab OT Goals OT Goal Formulation: With patient Time For Goal Achievement: 12/29/11 Potential to Achieve Goals: Good ADL Goals Pt Will Transfer to Toilet: with modified independence;Ambulation;Regular height toilet ADL Goal: Toilet Transfer - Progress: Goal set today Miscellaneous OT Goals Miscellaneous OT Goal #1: Pt will retrieve clothes at mod I level with AD and complete ADL routine  OT Goal: Miscellaneous Goal #1 - Progress: Goal set today Miscellaneous OT Goal #2: Pt will verbalize 3 energy conservation techniques OT Goal: Miscellaneous Goal #2 - Progress: Goal set today  Visit Information  Last OT Received On: 12/15/11 Assistance Needed: +1    Subjective Data  Subjective: What do you do? Patient Stated Goal: Get stronger.  Pt used to walk 1 mile a day before he got sick   Prior Functioning     Home Living Lives With: Spouse Bathroom Shower/Tub: Engineer, manufacturing systems: Standard Additional Comments: pt had stood in shower before doing chemo until recently Prior Function Level of Independence: Independent Communication Communication: No difficulties         Vision/Perception     Cognition  Overall Cognitive Status: Appears within functional limits for tasks assessed/performed Arousal/Alertness: Awake/alert Orientation Level: Appears intact for tasks assessed Behavior During Session: [facility] for tasks performed    Extremity/Trunk Assessment Right Upper Extremity Assessment RUE ROM/Strength/Tone: Within  functional levels Left Upper Extremity Assessment LUE ROM/Strength/Tone: WFL for tasks assessed     Mobility Bed Mobility Bed Mobility: Supine to Sit Supine to Sit: 6: Modified independent (Device/Increase time);With rails;HOB elevated Details for Bed Mobility Assistance: pt needs encouragement to partipate He got to edge of bed because he needed to use urinal and knew he was not walking to bathroom due to IV pole and PAS hose Transfers Sit to Stand: 4: Min guard;From bed;With upper extremity assist;5: Supervision Stand to Sit: 5: Supervision     Shoulder Instructions     Exercise     Balance Balance Balance Assessed: No   End of Session OT - End of Session Activity Tolerance: Patient limited by fatigue Patient left: in bed;with call bell/phone within reach  GO     [facility] 12/15/2011, 12:43 PM Marica Otter, OTR/L [phone] 12/15/2011

## 2011-12-15 NOTE — Progress Notes (Signed)
Physical Therapy Treatment Patient Details Name: Justin Pittman MRN: [id number] DOB: 1932-11-18 Today's Date: 12/15/2011 Time: 4098-1191 PT Time Calculation (min): 13 min  PT Assessment / Plan / Recommendation Comments on Treatment Session  pt needed much encouragement, but was able to walk in hallway with PT.  He wanted to hold onto IV pole, but did not want to use a RW when pole was taken away from him.  Recommend pt walk frequently with nurses/family to incrase his activity tolerance.    Follow Up Recommendations        Does the patient have the potential to tolerate intense rehabilitation     Barriers to Discharge        Equipment Recommendations       Recommendations for Other Services    Frequency     Plan Discharge plan remains appropriate;Frequency remains appropriate    Precautions / Restrictions Precautions Precautions: Fall Restrictions Weight Bearing Restrictions: No   Pertinent Vitals/Pain C/o some right sided chest pain    Mobility  Bed Mobility Bed Mobility: Supine to Sit Supine to Sit: 6: Modified independent (Device/Increase time);With rails;HOB elevated Details for Bed Mobility Assistance: pt needs encouragement to partipate He got to edge of bed because he needed to use urinal and knew he was not walking to bathroom due to IV pole and PAS hose Transfers Transfers: Sit to Stand;Stand to Sit Sit to Stand: 4: Min guard;From bed;With upper extremity assist;5: Supervision Stand to Sit: 5: Supervision Ambulation/Gait Ambulation/Gait Assistance: 5: Supervision Ambulation Distance (Feet): 125 Feet Ambulation/Gait Assistance Details: pt pushed IV pole part of the way and was able to walk without any UE support the rest of the way Gait Pattern: Decreased step length - right;Decreased step length - left;Decreased dorsiflexion - right;Decreased dorsiflexion - left;Shuffle;Trunk flexed;Decreased trunk rotation Gait velocity: decreased General Gait Details: pt has  some gait deviations and apprears that he may benefit from a RW, but he defers using it even after he says he wants to hold onto the IV pole to "steady" himself.  Pt self limits distance and amount he will participate with PT Stairs: No Wheelchair Mobility Wheelchair Mobility: No    Exercises     PT Diagnosis:    PT Problem List:   PT Treatment Interventions:     PT Goals Acute Rehab PT Goals PT Goal Formulation: With patient Time For Goal Achievement: 12/28/11 Potential to Achieve Goals: Good Pt will go Supine/Side to Sit: with supervision PT Goal: Supine/Side to Sit - Progress: Progressing toward goal Pt will go Sit to Stand: with supervision PT Goal: Sit to Stand - Progress: Progressing toward goal Pt will go Stand to Sit: with supervision PT Goal: Stand to Sit - Progress: Progressing toward goal Pt will Ambulate: 16 - 50 feet;with rolling walker;with min assist PT Goal: Ambulate - Progress: Progressing toward goal  Visit Information  Last PT Received On: 12/15/11 Assistance Needed: +1    Subjective Data  Subjective: I don't think I need anything (re: walker or cane) Patient Stated Goal: to go home   Cognition  Overall Cognitive Status: Appears within functional limits for tasks assessed/performed Arousal/Alertness: Awake/alert Orientation Level: Appears intact for tasks assessed Behavior During Session: [facility] for tasks performed    Balance  Balance Balance Assessed: No  End of Session PT - End of Session Activity Tolerance: Other (comment);Patient limited by fatigue (pt limits how much he will do)   GP     Rosey Bath K. [place], [place] [phone] 12/15/2011, 12:20 PM

## 2011-12-16 LAB — BASIC METABOLIC PANEL
Calcium: 8.1 mg/dL — ABNORMAL LOW (ref 8.4–10.5)
Creatinine, Ser: 0.69 mg/dL (ref 0.50–1.35)
GFR calc Af Amer: >90 mL/min (ref >90)

## 2011-12-16 LAB — CBC
MCH: 27.8 pg (ref 26.0–34.0)
MCV: 78.4 fL (ref 78.0–100.0)
Platelets: 130 10*3/uL — ABNORMAL LOW (ref 150–400)
RDW: 16.3 % — ABNORMAL HIGH (ref 11.5–15.5)

## 2011-12-16 LAB — TYPE AND SCREEN: Unit division: 0

## 2011-12-16 LAB — GLUCOSE, CAPILLARY
Glucose-Capillary: 118 mg/dL — ABNORMAL HIGH (ref 70–99)
Glucose-Capillary: 126 mg/dL — ABNORMAL HIGH (ref 70–99)

## 2011-12-16 MED ORDER — POTASSIUM CHLORIDE CRYS ER 20 MEQ PO TBCR
40.0000 meq | EXTENDED_RELEASE_TABLET | Freq: Once | ORAL | Status: AC
  Administered 2011-12-16: 40 meq via ORAL
  Filled 2011-12-16: qty 2

## 2011-12-16 MED ORDER — CLINIMIX E/DEXTROSE (5/20) 5 % IV SOLN
INTRAVENOUS | Status: AC
  Administered 2011-12-16: 18:00:00 via INTRAVENOUS
  Filled 2011-12-16: qty 2000

## 2011-12-16 MED ORDER — POTASSIUM CHLORIDE 10 MEQ/50ML IV SOLN
10.0000 meq | INTRAVENOUS | Status: AC
  Administered 2011-12-16 (×4): 10 meq via INTRAVENOUS
  Filled 2011-12-16 (×4): qty 50

## 2011-12-16 NOTE — Progress Notes (Signed)
PARENTERAL NUTRITION CONSULT NOTE - FOLLOW UP  Pharmacy Consult for TNA  Indication: Poor oral intake 2/2 radiation esophagitis  No Known Allergies  Patient Measurements: Height: 5\' 10"  (177.8 cm) Weight: 135 lb 12.8 oz (61.598 kg) IBW/kg (Calculated) : 73  Usual Weight: 71 kg   Vital Signs: Temp: 98 F (36.7 C) (10/19 0554) Temp src: Oral (10/19 0554) BP: 152/77 mmHg (10/19 0554) Pulse Rate: 98  (10/19 0554) Intake/Output from previous day: 10/18 0701 - 10/19 0700 In: 240 [P.O.:240] Out: 1875 [Urine:1875] Intake/Output from this shift: Total I/O In: -  Out: 1300 [Urine:1300]  Labs:  [facility] 12/16/11 0531 12/15/11 0650 12/14/11 0640  WBC 2.7* 2.4* 2.8*  HGB 9.4* 8.7* 7.9*  HCT 26.5* 24.2* 22.2*  PLT 130* 118* 125*  APTT -- -- --  INR -- -- --     Basename 12/16/11 0531 12/15/11 0650 12/14/11 0640  NA 131* 131* 134*  K 3.4* 3.1* 3.2*  CL 96 97 101  CO2 28 27 25   GLUCOSE 116* 111* 126*  BUN 12 11 16   CREATININE 0.69 0.63 0.68  LABCREA -- -- --  CREAT24HRUR -- -- --  CALCIUM 8.1* 7.8* 7.9*  MG -- 1.4* 1.4*  PHOS -- 2.3 1.5*  PROT -- 5.0* 5.1*  ALBUMIN -- 2.5* 2.6*  AST -- 12 10  ALT -- 7 8  ALKPHOS -- 77 80  BILITOT -- 0.6 0.3  BILIDIR -- -- --  IBILI -- -- --  PREALBUMIN -- -- 10.8*  TRIG -- -- 87  CHOLHDL -- -- --  CHOL -- -- 129   Estimated Creatinine Clearance: 67.4 ml/min (by C-G formula based on Cr of 0.69).    Basename 12/16/11 0053 12/15/11 1628 12/15/11 0749  GLUCAP 118* 115* 121*     Insulin Requirements in the past 24 hours:  CBGs < 150, required 2 units moderate SSI Q8h  Nutritional Goals RD rec 10/16: Kcal:1950-2300, Protein: 85-100g, Fluid:1.9-2.3L Clinimix E5/20 at a goal rate of 80 ml/hr with lipids MWF will provide 96 g protein daily and 1689 kcal TTSS, 2169 kcal MWF, and an average of 1790 kcal/day.   Current Nutrition Clinimix E5/20 @ 40 ml/hr IVF 20% @ 51ml/hr on MWF Regular diet starting 10/15 but poor  appetite mIIVF NS @ 100 ml/hr  Asssessment: 79 yom with h/o stage IIIB NSCLC s/p concurrent chemoradiation for 6 weeks (carboplatin/paclitaxel) with last doses given 10/813. Patient presented 12/12/2011 for intractable nausea and vomiting associated with dysphagia and odynophagia secondary to radiation induced esophagitis. TNA started 10/16 for poor oral intake and protein calorie malnutrition. Chemo and radiation currently on hold until improvement in condition.  Patient currently has regular diet oral but oral intake is poor - recorded a total of 240 mL po intake with 0% meals eaten.   Labs Renal function: Scr stable/wnl Hepatic function: all wnl Electrolytes: Na low, mag replaced 10/18 (no new lab today), other lytes wnl Pre-Albumin: 10.8 (10/17) TG/Cholesterol: wnl 10/17  Plan:  At 1800 tonight  Advance Clinimix E5/20 to 60 ml/hr  TNA to contain IV fat emulsion, standard multivitamins and trace elements only on MWF only due to ongoing shortage  4 runs of KCl   Will re-check magnesium tomorrow to ensure adequate replacement on 10/18.  Continue SSI q8h  TNA labs Monday/Thursdays  Pharmacy will follow up daily   Geoffry Paradise, PharmD, BCPS Pager: [phone] 6:33 AM Pharmacy #: [phone]

## 2011-12-16 NOTE — Progress Notes (Signed)
[facility] PROGRESS NOTE  Justin Pittman WJX:[id number] DOB: 07-20-1932 DOA: 12/12/2011 PCP: [name], MD  Brief narrative: 78 year old male with history of stage IIIB non-small cell lung cancer, squamous cell carcinoma with right lower lobe lung mass as well as left hilar lymphadenopathy (diagnosed in August of 2013) receiving chemoradiation with weekly carboplatin and paclitaxel, status post 6 weekly doses of treatment last dose was given on 12/05/2011. Patient presented 12/12/2011 for intractable nausea and vomiting associated with pain and difficulty with swallowing. Patient is receiving TNA for temporary nutritional support.   Assessment/Plan:   Principal Problem:  *Radiation esophagitis  Continue fluconazole 100 mg daily  Continue Protonix 40 mg IV daily  Continue lidocaine viscous solution , changed to Q 4 hours scheduled at least for today  Continue TNA  Active Problems:  Non-small cell lung cancer  Appreciate oncology following  Further radiation and chemotherapy on hold Pancytopenia  Likely secondary to sequela of chemotherapy and malignancy  No signs of active bleed  Platelet count improving Anemia of chronic disease  Likely sequela of chemotherapy and malignancy  Hemoglobin today 9.4 status post 1 unit PRBC done 10/17 Hypokalemia/ Hypomagnesemia  Being repleted Follow up BMP and Magnesium level in am  Phosphate level is WNL Protein calorie malnutrition  Secondary to malignancy and poor oral intake due to radiation esophagitis  On TNA; tolerates it well Hypertension  Hydralazine 10 mg every 6 hours as needed IV BP 152/77 DVT prophylaxis  Lovenox subcutaneous D/Ced due to thrombocytopenia  Continue SCD bialterally  Code Status: full code  Family Communication: Son and wife at bedside updated 12/14/2011  Disposition Plan: home when stable   Manson Passey, MD  [facility]  Pager [phone]  Consultants:  Oncology   Antibiotics:  Fluconazole  If  7PM-7AM, please contact night-coverage [email or web address] Password [email or web address] 12/16/2011, 7:53 AM   LOS: 4 days   HPI/Subjective: Reports feeling better but still with too much pain to swallow.  Objective: Filed Vitals:   12/15/11 0635 12/15/11 1336 12/15/11 2116 12/16/11 0554  BP: 145/78 157/79 155/77 152/77  Pulse: 108 99 98 98  Temp: 98 F (36.7 C) 97.9 F (36.6 C) 98.1 F (36.7 C) 98 F (36.7 C)  TempSrc: Oral Oral Oral Oral  Resp: 16 19 16 16   Height:      Weight: 61.598 kg (135 lb 12.8 oz)     SpO2: 98% 100% 97% 97%    Intake/Output Summary (Last 24 hours) at 12/16/11 0753 Last data filed at 12/16/11 0555  Gross per 24 hour  Intake    240 ml  Output   1725 ml  Net  -1485 ml    Exam:   General:  Pt is alert, follows commands appropriately, not in acute distress  Cardiovascular: Regular rate and rhythm, S1/S2, no murmurs, no rubs, no gallops  Respiratory: Clear to auscultation bilaterally, no wheezing, no crackles, no rhonchi  Abdomen: Soft, non tender, non distended, bowel sounds present, no guarding  Extremities: No edema, pulses DP and PT palpable bilaterally  Neuro: Grossly nonfocal  Data Reviewed: Basic Metabolic Panel:  Lab 12/16/11 7846 12/15/11 0650 12/14/11 0640 12/13/11 0325 12/12/11 0910  NA 131* 131* 134* 136 135  K 3.4* 3.1* 3.2* 3.7 3.3*  CL 96 97 101 102 98  CO2 28 27 25 25 27   GLUCOSE 116* 111* 126* 76 104*  BUN 12 11 16 21  26*  CREATININE 0.69 0.63 0.68 0.82 0.90  CALCIUM 8.1* 7.8* 7.9* 8.1* 8.6  MG --  1.4* 1.4* -- --  PHOS -- 2.3 1.5* -- --   Liver Function Tests:  Lab 12/15/11 0650 12/14/11 0640  AST 12 10  ALT 7 8  ALKPHOS 77 80  BILITOT 0.6 0.3  PROT 5.0* 5.1*  ALBUMIN 2.5* 2.6*   CBC:  Lab 12/16/11 0531 12/15/11 0650 12/14/11 0640 12/13/11 0325 12/12/11 0910  WBC 2.7* 2.4* 2.8* 2.4* 2.3*  HGB 9.4* 8.7* 7.9* 8.1* 9.0*  HCT 26.5* 24.2* 22.2* 23.6* 25.6*  MCV 78.4 78.1 78.7 79.7 78.5  PLT 130* 118* 125* 141* 159    CBG:  Lab 12/16/11 0053 12/15/11 1628 12/15/11 0749 12/15/11 0220 12/14/11 1803  GLUCAP 118* 115* 121* 114* 143*   Scheduled Meds:   . fluconazole (DIFLUCAN)   100 mg Intravenous Q24H  . insulin aspart  0-15 Units Subcutaneous Q8H  . lidocaine  20 mL Mouth/Throat Q4H  . magnesium sulfate  2 g Intravenous Once  . pantoprazole   40 mg Intravenous Q24H  . phosphorus  250 mg Oral Daily  . potassium chloride  10 mEq Intravenous Q1 Hr x 4   Continuous Infusions:   . sodium chloride 100 mL/hr at 12/15/11 2129  . fat emulsion 250 mL (12/15/11 1712)  . TPN (CLINIMIX) +/- additives 40 mL/hr at 12/14/11 1712  . TPN (CLINIMIX) +/- additives 40 mL/hr at 12/15/11 1712  . TPN (CLINIMIX) +/- additives

## 2011-12-17 ENCOUNTER — Inpatient Hospital Stay (HOSPITAL_COMMUNITY): Payer: Medicare Other

## 2011-12-17 LAB — CBC
Hemoglobin: 8.5 g/dL — ABNORMAL LOW (ref 13.0–17.0)
MCHC: 33.6 g/dL (ref 30.0–36.0)
RBC: 3.08 MIL/uL — ABNORMAL LOW (ref 4.22–5.81)

## 2011-12-17 LAB — URINALYSIS, ROUTINE W REFLEX MICROSCOPIC
Bilirubin Urine: NEGATIVE
Ketones, ur: NEGATIVE mg/dL
Nitrite: NEGATIVE
Urobilinogen, UA: 2 mg/dL — ABNORMAL HIGH (ref 0.0–1.0)

## 2011-12-17 LAB — BASIC METABOLIC PANEL
Chloride: 95 mEq/L — ABNORMAL LOW (ref 96–112)
Creatinine, Ser: 0.79 mg/dL (ref 0.50–1.35)
GFR calc Af Amer: >90 mL/min (ref >90)
GFR calc non Af Amer: 84 mL/min — ABNORMAL LOW (ref >90)

## 2011-12-17 LAB — MAGNESIUM: Magnesium: 1.5 mg/dL (ref 1.5–2.5)

## 2011-12-17 MED ORDER — ACETAMINOPHEN 325 MG PO TABS
650.0000 mg | ORAL_TABLET | Freq: Four times a day (QID) | ORAL | Status: DC | PRN
  Filled 2011-12-17: qty 2

## 2011-12-17 MED ORDER — PIPERACILLIN-TAZOBACTAM 3.375 G IVPB
3.3750 g | Freq: Three times a day (TID) | INTRAVENOUS | Status: DC
  Administered 2011-12-17 – 2011-12-19 (×6): 3.375 g via INTRAVENOUS
  Filled 2011-12-17 (×7): qty 50

## 2011-12-17 MED ORDER — VANCOMYCIN HCL 1000 MG IV SOLR
750.0000 mg | Freq: Two times a day (BID) | INTRAVENOUS | Status: DC
  Administered 2011-12-17 – 2011-12-19 (×4): 750 mg via INTRAVENOUS
  Filled 2011-12-17 (×5): qty 750

## 2011-12-17 MED ORDER — ACETAMINOPHEN 650 MG RE SUPP
650.0000 mg | Freq: Four times a day (QID) | RECTAL | Status: DC | PRN
  Administered 2011-12-17: 650 mg via RECTAL
  Filled 2011-12-17 (×2): qty 1

## 2011-12-17 MED ORDER — CLINIMIX E/DEXTROSE (5/20) 5 % IV SOLN
INTRAVENOUS | Status: AC
  Administered 2011-12-17: 18:00:00 via INTRAVENOUS
  Filled 2011-12-17: qty 2000

## 2011-12-17 MED ORDER — MAGNESIUM SULFATE 40 MG/ML IJ SOLN
2.0000 g | Freq: Once | INTRAMUSCULAR | Status: AC
  Administered 2011-12-17: 2 g via INTRAVENOUS
  Filled 2011-12-17: qty 50

## 2011-12-17 MED ORDER — HYDROMORPHONE HCL PF 2 MG/ML IJ SOLN
2.0000 mg | Freq: Four times a day (QID) | INTRAMUSCULAR | Status: DC | PRN
  Administered 2011-12-17: 2 mg via INTRAVENOUS
  Filled 2011-12-17: qty 1

## 2011-12-17 NOTE — Progress Notes (Signed)
Pt spiked temp of 101. Dr Elisabeth Pigeon notified via phone & orders received. Sanyia Dini, [employer]

## 2011-12-17 NOTE — Progress Notes (Signed)
PARENTERAL NUTRITION CONSULT NOTE - FOLLOW UP  Pharmacy Consult for TNA  Indication: Poor oral intake 2/2 radiation esophagitis  No Known Allergies  Patient Measurements: Height: 5\' 10"  (177.8 cm) Weight: 135 lb 12.8 oz (61.598 kg) IBW/kg (Calculated) : 73  Usual Weight: 71 kg   Vital Signs: Temp: 98.2 F (36.8 C) (10/20 0510) Temp src: Oral (10/20 0510) BP: 105/60 mmHg (10/20 0510) Pulse Rate: 83  (10/20 0510) Intake/Output from previous day: 10/19 0701 - 10/20 0700 In: 9605.1 [P.O.:100; I.V.:5461.7; IV Piggyback:200; WUJ:8119.1] Out: 3425 [Urine:3425] Intake/Output from this shift:    Labs:  Basename 12/17/11 0400 12/16/11 0531 12/15/11 0650  WBC 2.2* 2.7* 2.4*  HGB 8.5* 9.4* 8.7*  HCT 25.3* 26.5* 24.2*  PLT 125* 130* 118*  APTT -- -- --  INR -- -- --     Basename 12/17/11 0555 12/16/11 0531 12/15/11 0650  NA 129* 131* 131*  K 4.0 3.4* 3.1*  CL 95* 96 97  CO2 26 28 27   GLUCOSE 107* 116* 111*  BUN 15 12 11   CREATININE 0.79 0.69 0.63  LABCREA -- -- --  CREAT24HRUR -- -- --  CALCIUM 8.3* 8.1* 7.8*  MG 1.5 -- 1.4*  PHOS -- -- 2.3  PROT -- -- 5.0*  ALBUMIN -- -- 2.5*  AST -- -- 12  ALT -- -- 7  ALKPHOS -- -- 77  BILITOT -- -- 0.6  BILIDIR -- -- --  IBILI -- -- --  PREALBUMIN -- -- --  TRIG -- -- --  CHOLHDL -- -- --  CHOL -- -- --   Estimated Creatinine Clearance: 67.4 ml/min (by C-G formula based on Cr of 0.79).    Basename 12/16/11 2347 12/16/11 1630 12/16/11 0832  GLUCAP 126* 118* 122*    Insulin Requirements in the past 24 hours:  CBGs < 150, required 4 units moderate SSI Q8h  Nutritional Goals RD rec 10/16: Kcal:1950-2300, Protein: 85-100g, Fluid:1.9-2.3L Clinimix E5/20 at a goal rate of 80 ml/hr with lipids MWF will provide 96 g protein daily and 1689 kcal TTSS, 2169 kcal MWF, and an average of 1790 kcal/day.   Current Nutrition Clinimix E5/20 @ 60 ml/hr IVF 20% @ 47ml/hr on MWF Regular diet starting 10/15 but poor appetite mIIVF NS  @ 100 ml/hr  Asssessment: 79 yom with h/o stage IIIB NSCLC s/p concurrent chemoradiation for 6 weeks (carboplatin/paclitaxel) with last doses given 10/813. Patient presented 12/12/2011 for intractable nausea and vomiting associated with dysphagia and odynophagia secondary to radiation induced esophagitis. TNA started 10/16 for poor oral intake and protein calorie malnutrition. Chemo and radiation currently on hold until improvement in condition.  Patient currently has regular diet oral but oral intake is poor - recorded a total of 100 mL po intake with 0% meals eaten.   Labs Renal function: Scr stable/wnl Hepatic function: all wnl Electrolytes: Na+/Cl- low, corrected Calc wnl, repeated Mag = 1.5 Pre-Albumin: 10.8 (10/17) TG/Cholesterol: wnl 10/17  Plan:  At 1800 tonight  Advance Clinimix E5/20 to goal of 80 ml/hr  TNA to contain IV fat emulsion, standard multivitamins and trace elements only on MWF only due to ongoing shortage  Continue SSI q8h  TNA labs Monday/Thursdays  Pharmacy will follow up daily   Geoffry Paradise, PharmD, BCPS Pager: [phone] 7:42 AM Pharmacy #: [phone]

## 2011-12-17 NOTE — Progress Notes (Signed)
Page successfully returned by Dr Elisabeth Pigeon after giving her number other than RN's issued cell phone. Orders received. Marguriete Wootan, [employer]

## 2011-12-17 NOTE — Progress Notes (Signed)
[facility] PROGRESS NOTE  Justin Pittman AVW:[id number] DOB: [birth year] DOA: 12/12/2011 PCP: [name], MD  Brief narrative: 78 year old male with history of stage IIIB non-small cell lung cancer, squamous cell carcinoma with right lower lobe lung mass as well as left hilar lymphadenopathy (diagnosed in August of 2013) receiving chemoradiation with weekly carboplatin and paclitaxel, status post 6 weekly doses of treatment last dose was given on 12/05/2011. Patient presented 12/12/2011 for intractable nausea and vomiting associated with pain and difficulty with swallowing. Patient is receiving TNA for temporary nutritional support. At this time we will get SLP evaluation and encourage PO intake as recommended and  tolerated.  Assessment/Plan:   Principal Problem:  *Radiation esophagitis  We will continue fluconazole 100 mg daily  We will continue Protonix 40 mg IV daily  We willcontinue lidocaine viscous solution , changed to Q 4 hours scheduled at least for today  We will continue TNA  Active Problems:  Non-small cell lung cancer  Appreciate oncology following  Further radiation and chemotherapy on hold Pancytopenia  Likely secondary to sequela of chemotherapy and malignancy  No signs of active bleed  Platelet count stable Anemia of chronic disease  Likely sequela of chemotherapy and malignancy  Hemoglobin remains stable Since admission total of 1 unit PRBC's transfused Hypokalemia/ Hypomagnesemia  Potassium and magnesium are within normal limits We will supplement magnesium today as well as it is on lower end of normal range Phosphate level is WNL Protein calorie malnutrition  Secondary to malignancy and poor oral intake due to radiation esophagitis  On TNA; tolerates it well Will get SLP evaluation Hypertension  Hydralazine 10 mg every 6 hours as needed IV  BP 105/60; at goal DVT prophylaxis  Lovenox subcutaneous D/Ced due to thrombocytopenia  Continue SCD  bialterally  Code Status: full code  Family Communication: Son and wife at bedside updated 12/14/2011  Disposition Plan: home when stable; perhaps in next 2 days  Manson Passey, MD  [facility]  Pager [phone]   Consultants:  Oncology  Antibiotics:  Fluconazole Procedures  None   If 7PM-7AM, please contact night-coverage [email or web address] Password [email or web address] 12/17/2011, 11:23 AM   LOS: 5 days   HPI/Subjective: No acute overnight events.  Objective: Filed Vitals:   12/16/11 0554 12/16/11 1400 12/16/11 2049 12/17/11 0510  BP: 152/77 148/69 137/64 105/60  Pulse: 98 82 97 83  Temp: 98 F (36.7 C) 98.2 F (36.8 C) 98.6 F (37 C) 98.2 F (36.8 C)  TempSrc: Oral Oral Oral Oral  Resp: 16 18 17 15   Height:      Weight:      SpO2: 97% 98% 97% 98%    Intake/Output Summary (Last 24 hours) at 12/17/11 1123 Last data filed at 12/17/11 0825  Gross per 24 hour  Intake 9605.11 ml  Output   3625 ml  Net 5980.11 ml    Exam:   General:  Pt is alert, follows commands appropriately, not in acute distress  Cardiovascular: Regular rate and rhythm, S1/S2, no murmurs, no rubs, no gallops  Respiratory: Clear to auscultation bilaterally, no wheezing, no crackles, no rhonchi  Abdomen: Soft, non tender, non distended, bowel sounds present, no guarding  Extremities: No edema, pulses DP and PT palpable bilaterally  Neuro: Grossly nonfocal  Data Reviewed: Basic Metabolic Panel:  Lab 12/17/11 3086 12/16/11 0531 12/15/11 0650 12/14/11 0640 12/13/11 0325  NA 129* 131* 131* 134* 136  K 4.0 3.4* 3.1* 3.2* 3.7  CL 95* 96 97 101 102  CO2 26  28 27 25 25   GLUCOSE 107* 116* 111* 126* 76  BUN 15 12 11 16 21   CREATININE 0.79 0.69 0.63 0.68 0.82  CALCIUM 8.3* 8.1* 7.8* 7.9* 8.1*   Liver Function Tests:  Lab 12/15/11 0650 12/14/11 0640  AST 12 10  ALT 7 8  ALKPHOS 77 80  BILITOT 0.6 0.3  PROT 5.0* 5.1*  ALBUMIN 2.5* 2.6*   CBC:  Lab 12/17/11 0400 12/16/11 0531 12/15/11 0650 12/14/11 0640  12/13/11 0325  WBC 2.2* 2.7* 2.4* 2.8* 2.4*  HGB 8.5* 9.4* 8.7* 7.9* 8.1*  HCT 25.3* 26.5* 24.2* 22.2* 23.6*  MCV 82.1 78.4 78.1 78.7 79.7  PLT 125* 130* 118* 125* 141*   CBG:  Lab 12/17/11 0747 12/16/11 2347 12/16/11 1630 12/16/11 0832 12/16/11 0053  GLUCAP 130* 126* 118* 122* 118*    Scheduled Meds:   . fluconazole (DIFLUCAN)   100 mg Intravenous Q24H  . insulin aspart  0-15 Units Subcutaneous Q8H  . lidocaine  20 mL Mouth/Throat Q4H  . pantoprazole   40 mg Intravenous Q24H  . phosphorus  250 mg Oral Daily  . potassium chloride  10 mEq Intravenous Q1 Hr x 4   Continuous Infusions:   . sodium chloride 100 mL/hr at 12/17/11 1054  . fat emulsion 250 mL (12/16/11 1712)  . TPN (CLINIMIX) +/- additives 40 mL/hr at 12/15/11 1712  . TPN (CLINIMIX) +/- additives 60 mL/hr at 12/17/11 0700  . TPN (CLINIMIX) +/- additives

## 2011-12-17 NOTE — Progress Notes (Signed)
Pt's wife brought attention to pt having tremors & urinary frequency with hesitancy & poor output each time. She also noted that pt's legs were discolored & pt lethargic as well. This confirmed via observation. Pt assisted to stand & void. VSS x/BP & pulse elevated. Pt voided approx 75cc, bladder scanned for 315cc. Noted in record that pt has received 4-5 doses of 2mg  dilaudid each day. RRT also called to room d/t symptoms & elevated HR & BP. RRT RN concurred with observations. Dr Elisabeth Pigeon paged. Aiva Miskell, [employer]

## 2011-12-17 NOTE — Progress Notes (Signed)
ANTIBIOTIC CONSULT NOTE - INITIAL  Pharmacy Consult for Vancomycin/Zosyn Indication: rule out pneumonia  No Known Allergies  Patient Measurements: Height: 5\' 10"  (177.8 cm) Weight: 135 lb 12.8 oz (61.598 kg) IBW/kg (Calculated) : 73  Adjusted Body Weight:   Vital Signs: Temp: 101 F (38.3 C) (10/20 1840) Temp src: Oral (10/20 1840) BP: 138/69 mmHg (10/20 1700) Pulse Rate: 137  (10/20 1700) Intake/Output from previous day: 10/19 0701 - 10/20 0700 In: 9605.1 [P.O.:100; I.V.:5461.7; IV Piggyback:200; TPN:3843.4] Out: 3425 [Urine:3425] Intake/Output from this shift: Total I/O In: 0  Out: 1340 [Urine:1340]  Labs:  [facility] 12/17/11 0555 12/17/11 0400 12/16/11 0531 12/15/11 0650  WBC -- 2.2* 2.7* 2.4*  HGB -- 8.5* 9.4* 8.7*  PLT -- 125* 130* 118*  LABCREA -- -- -- --  CREATININE 0.79 -- 0.69 0.63   Estimated Creatinine Clearance: 67.4 ml/min (by C-G formula based on Cr of 0.79). No results found for this basename: VANCOTROUGH:2,VANCOPEAK:2,VANCORANDOM:2,GENTTROUGH:2,GENTPEAK:2,GENTRANDOM:2,TOBRATROUGH:2,TOBRAPEAK:2,TOBRARND:2,AMIKACINPEAK:2,AMIKACINTROU:2,AMIKACIN:2, in the last 72 hours   Microbiology: No results found for this or any previous visit (from the past 720 hour(s)).  Medical History: Past Medical History  Diagnosis Date  . HTN (hypertension)   . Lung cancer     Squamous Cell  . Vertigo 2010    Hospitalized a [facility]  . Hyperlipidemia   . Lung cancer     Assessment: 46 yoM with h/o NSCLC s/p chemoradiation known to pharmacy for current TNA therapy protocol due to dysphagia and odynophagia 2/2 radiation induced esophagitis.  Pharmacy asked to dose antibiotics for sudden fever and suspected pneumonia.  To note, pt has pancytopenia, plts 125 - stable.   WBC 2.7-->2.2  Tm 101  SCr 0.79, CrCl ~ 67 ml/min  Wt 61.6 kg  Blood, urine cultures pending.    Goal of Therapy:  Vancomycin trough level 15-20 mcg/ml  Plan:  1.  Zosyn 3.375g IV q 8 hours (4  hour infusion). 2.  Vancomcyin 750mg  IV q 12 hours 3.  Check vancomycin trough at steady state 4.  F/u cultures, T, renal fxn, and clinical course  Doraine Schexnider E 12/17/2011,7:00 PM

## 2011-12-18 ENCOUNTER — Ambulatory Visit: Payer: Medicare Other

## 2011-12-18 DIAGNOSIS — D649 Anemia, unspecified: Secondary | ICD-10-CM

## 2011-12-18 DIAGNOSIS — B370 Candidal stomatitis: Secondary | ICD-10-CM

## 2011-12-18 DIAGNOSIS — R509 Fever, unspecified: Secondary | ICD-10-CM

## 2011-12-18 LAB — CBC
MCH: 27.5 pg (ref 26.0–34.0)
MCV: 79.2 fL (ref 78.0–100.0)
Platelets: 126 10*3/uL — ABNORMAL LOW (ref 150–400)
RBC: 2.98 MIL/uL — ABNORMAL LOW (ref 4.22–5.81)

## 2011-12-18 LAB — GLUCOSE, CAPILLARY
Glucose-Capillary: 120 mg/dL — ABNORMAL HIGH (ref 70–99)
Glucose-Capillary: 131 mg/dL — ABNORMAL HIGH (ref 70–99)

## 2011-12-18 LAB — COMPREHENSIVE METABOLIC PANEL
ALT: 8 U/L (ref 0–53)
Alkaline Phosphatase: 71 U/L (ref 39–117)
CO2: 26 mEq/L (ref 19–32)
Chloride: 97 mEq/L (ref 96–112)
GFR calc Af Amer: >90 mL/min (ref >90)
Glucose, Bld: 106 mg/dL — ABNORMAL HIGH (ref 70–99)
Potassium: 3.7 mEq/L (ref 3.5–5.1)
Sodium: 130 mEq/L — ABNORMAL LOW (ref 135–145)
Total Protein: 4.9 g/dL — ABNORMAL LOW (ref 6.0–8.3)

## 2011-12-18 LAB — URINE CULTURE
Colony Count: NO GROWTH
Culture: NO GROWTH

## 2011-12-18 LAB — DIFFERENTIAL
Basophils Relative: 0 % (ref 0–1)
Eosinophils Relative: 0 % (ref 0–5)
Lymphs Abs: 0.3 10*3/uL — ABNORMAL LOW (ref 0.7–4.0)
Monocytes Absolute: 0.9 10*3/uL (ref 0.1–1.0)

## 2011-12-18 MED ORDER — FAT EMULSION 20 % IV EMUL
250.0000 mL | INTRAVENOUS | Status: AC
  Administered 2011-12-18: 250 mL via INTRAVENOUS
  Filled 2011-12-18: qty 250

## 2011-12-18 MED ORDER — FLUCONAZOLE IN SODIUM CHLORIDE 200-0.9 MG/100ML-% IV SOLN
200.0000 mg | INTRAVENOUS | Status: DC
  Administered 2011-12-18 – 2011-12-27 (×10): 200 mg via INTRAVENOUS
  Filled 2011-12-18 (×11): qty 100

## 2011-12-18 MED ORDER — HYDROMORPHONE HCL PF 2 MG/ML IJ SOLN
2.0000 mg | INTRAMUSCULAR | Status: DC | PRN
  Administered 2011-12-18 – 2011-12-25 (×29): 2 mg via INTRAVENOUS
  Administered 2011-12-26 (×2): 1 mg via INTRAVENOUS
  Administered 2011-12-26 – 2011-12-27 (×3): 2 mg via INTRAVENOUS
  Filled 2011-12-18 (×34): qty 1

## 2011-12-18 MED ORDER — SODIUM CHLORIDE 0.9 % IV SOLN
INTRAVENOUS | Status: DC
  Administered 2011-12-18 – 2011-12-20 (×2): via INTRAVENOUS
  Administered 2011-12-22: 20 mL via INTRAVENOUS
  Administered 2011-12-27: 16:00:00 via INTRAVENOUS

## 2011-12-18 MED ORDER — LIDOCAINE VISCOUS 2 % MT SOLN
20.0000 mL | Freq: Three times a day (TID) | OROMUCOSAL | Status: DC
  Administered 2011-12-18 – 2011-12-19 (×3): 20 mL via OROMUCOSAL
  Filled 2011-12-18 (×9): qty 20

## 2011-12-18 MED ORDER — SODIUM CHLORIDE 0.9 % IV BOLUS (SEPSIS): 500.0000 mL | Freq: Once | INTRAVENOUS | Status: DC

## 2011-12-18 MED ORDER — ZINC TRACE METAL 1 MG/ML IV SOLN
INTRAVENOUS | Status: AC
  Administered 2011-12-18: 18:00:00 via INTRAVENOUS
  Filled 2011-12-18: qty 2000

## 2011-12-18 MED ORDER — SENNOSIDES-DOCUSATE SODIUM 8.6-50 MG PO TABS
1.0000 | ORAL_TABLET | Freq: Every day | ORAL | Status: DC | PRN
  Administered 2011-12-18 – 2011-12-20 (×3): 1 via ORAL
  Filled 2011-12-18 (×2): qty 1

## 2011-12-18 NOTE — Progress Notes (Addendum)
PARENTERAL NUTRITION CONSULT NOTE - FOLLOW UP  Pharmacy Consult for TNA  Indication: Poor oral intake 2/2 radiation esophagitis  No Known Allergies  Patient Measurements: Height: 5\' 10"  (177.8 cm) Weight: 135 lb 12.8 oz (61.598 kg) IBW/kg (Calculated) : 73  Usual Weight: 71 kg   Vital Signs: Temp: 98.3 F (36.8 C) (10/21 0504) Temp src: Oral (10/21 0504) BP: 102/54 mmHg (10/21 0981) Pulse Rate: 88  (10/21 0541) Intake/Output from previous day: 10/20 0701 - 10/21 0700 In: 80 [P.O.:80] Out: 3915 [Urine:3915] Intake/Output from this shift:    Labs:  [facility] 12/18/11 0455 12/17/11 0400 12/16/11 0531  WBC 3.8* 2.2* 2.7*  HGB 8.2* 8.5* 9.4*  HCT 23.6* 25.3* 26.5*  PLT 126* 125* 130*  APTT -- -- --  INR -- -- --     Basename 12/18/11 0455 12/17/11 0555 12/16/11 0531  NA 130* 129* 131*  K 3.7 4.0 3.4*  CL 97 95* 96  CO2 26 26 28   GLUCOSE 106* 107* 116*  BUN 20 15 12   CREATININE 0.93 0.79 0.69  LABCREA -- -- --  CREAT24HRUR -- -- --  CALCIUM 8.2* 8.3* 8.1*  MG 1.8 1.5 --  PHOS 4.5 -- --  PROT 4.9* -- --  ALBUMIN 2.3* -- --  AST 13 -- --  ALT 8 -- --  ALKPHOS 71 -- --  BILITOT 0.5 -- --  BILIDIR -- -- --  IBILI -- -- --  PREALBUMIN -- -- --  TRIG 32 -- --  CHOLHDL -- -- --  CHOL 79 -- --   Estimated Creatinine Clearance: 58 ml/min (by C-G formula based on Cr of 0.93).    Basename 12/18/11 0728 12/17/11 2356 12/17/11 1552  GLUCAP 143* 136* 104*    Insulin Requirements in the past 24 hours:  CBGs < 150, required 4 units moderate SSI Q8h  Nutritional Goals RD rec 10/16: Kcal:1950-2300, Protein: 85-100g, Fluid:1.9-2.3L Clinimix E5/20 at a goal rate of 80 ml/hr with lipids MWF will provide 96 g protein daily and 1689 kcal TTSS, 2169 kcal MWF, and an average of 1790 kcal/day.   Current Nutrition Clinimix E5/20 @ 80 ml/hr Lipids 20% @ 36ml/hr on MWF Regular diet starting 10/15 but poor appetite mIIVF NS @ 100 ml/hr  Asssessment: 79 yom with h/o  stage IIIB NSCLC s/p concurrent chemoradiation for 6 weeks (carboplatin/paclitaxel) with last doses given 10/813. Patient presented 12/12/2011 for intractable nausea and vomiting associated with dysphagia and thrush secondary to radiation induced esophagitis. TNA started 10/16 for poor oral intake and protein calorie malnutrition. Chemo and radiation currently on hold until improvement in condition.  Patient currently has regular diet oral but oral intake is poor. Note low BP this am associated with temp spike, NS 500 ml/ bolus given. Urine output 3.9 L past 24 hr, with foley placed for voiding dysfunction.  Labs Renal function: Scr stable/wnl Hepatic function: all wnl Electrolytes: Na+ low, corrected Calc wnl, Mag = 1.8, Phos 4.5. (Unable to adjust lytes in pre-mixed TNA) Pre-Albumin: 10.8 (10/17), today's level pending TG/Cholesterol: wnl 10/17, 10/21  Plan:  At 1800 tonight  Continue Clinimix E5/20 at goal of 80 ml/hr  TNA to contain IV fat emulsion, standard multivitamins and trace elements only on MWF only due to ongoing shortage  Continue SSI q8h  TNA labs Monday/Thursdays  Pharmacy will follow up daily  Reduce IV NS to 50 ml/hr this am, monitor BP  Otho Bellows PharmD Pager [phone] 12/18/2011 11:43 AM

## 2011-12-18 NOTE — Progress Notes (Signed)
SLP Cancellation Note  Patient Details Name: Justin Pittman MRN: [id number] DOB: May 09, 1932   Cancelled eval:  Order for swallow evaluation received.  Pt not adequately alert for po intake- lethargic- sleeping with open mouth posture.  Spoke to family and educated them to general aspiration precautions.  Family states pt consuming small amount of liquids via straw but is not able to feed himself due to gross weakness and lethargy.   Note pt receiving lidocaine presumed to treat radiation esophagitis.             SLP to attempt to return later today for eval as ordered.   Thank you.    Donavan Burnet, MS [facility] SLP [phone]

## 2011-12-18 NOTE — Progress Notes (Signed)
[facility] hospital[facility] progress note. Chief complaint. Fever, hypotension. History of present illness. This 78 year old male has been the hospitalized with radiation esophagitis. He has known non-small cell lung cancer, pancytopenia, anemia chronic disease, etc. He was initiated on antibiotic therapy yesterday with vancomycin and Zosyn. He had a fever at that time of 101. The patient developed a fever again overnight of 101 that responded to Tylenol. I was called by nursing this a.m. with a blood pressure of 82/44. I came to the bedside to evaluate the patient with concerns for early sepsis. I find the patient alert, conversational and pleasant. He has no specific complaints and states he does not feel especially sick. He has no complaints of chest pain or dyspnea. He denies abdominal pain or painful urination. Vital signs. Afebrile, blood pressure 90/50 manual at the bedside, pulse 88, respirations 18 and O2 sats 98% on room air. General appearance. Well-developed elderly male who is alert, pleasant and oriented. Cardiac. Rate and rhythm regular. Lungs. Left lung fields are clear. I hear some expiratory wheezing throughout the right lung fields. No distress and stable O2 sats. Abdomen. Soft with positive bowel sounds. No pain with palpation. Urinary genital. No bladder pain or CVA tenderness. Impression/plan. Problem #1 hypotension. I suspect this probably secondary to some volume deficit. Early sepsis however, he is also a possible concern. Clinically the patient does not appear septic and that his blood pressure appears to be improving with the 500 cc normal saline fluid bolus that I have ordered. I've asked nursing to notify if this so bolus does not improve blood pressure to normal range. I have added lactic acid and pro calcitonin levels to the a.m. labs. These results should be a resulting soon. Nursing will notify of any other significant change in patient's condition. Problem #2. Fever unknown  etiology. Patient had a chest x-ray earlier that showed no active disease. Urinalysis also done was unremarkable. Blood cultures have largely been obtained. Patient has a PICC line but this does not look suggestive for infection. Patient on broad spectrum antibiotics.

## 2011-12-18 NOTE — Progress Notes (Signed)
Subjective: The patient is a pleasant 78 year old white male diagnosed with stage IIIB non-small cell lung cancer, squamous cell carcinoma currently undergoing a course of concurrent chemotherapy and radiation therapy. His chemotherapy is in the form of weekly carboplatin for an AUC of 2 and paclitaxel at 45 mg region squared, status post 6 weeks of chemotherapy. He currently has radiation therapy scheduled through 12/22/2011. Radiation therapy has been on hold on throughout this admission. He was admitted with severe nausea and vomiting, dizziness and generalized weakness. He reports some difficulty swallowing however states that this is somewhat better with the viscous lidocaine. His wife reports that he had some difficulty with urinary retention, thought likely related to his pain medication. This has since resolved after insertion of a Foley catheter.  Objective: Vital signs in last 24 hours: Temp:  [98 F (36.7 C)-101 F (38.3 C)] 98.1 F (36.7 C) (10/21 1441) Pulse Rate:  [83-137] 83  (10/21 1441) Resp:  [16-28] 18  (10/21 1441) BP: (82-155)/(44-69) 121/54 mmHg (10/21 1441) SpO2:  [96 %-99 %] 97 % (10/21 1441)  Intake/Output from previous day: 10/20 0701 - 10/21 0700 In: 80 [P.O.:80] Out: 3915 [Urine:3915] Intake/Output this shift: Total I/O In: 2357.8 [I.V.:97.5; TPN:2260.3] Out: -   General appearance: alert, cooperative, appears stated age and no distress Resp: clear to auscultation bilaterally Cardio: regular rate and rhythm, S1, S2 normal, no murmur, click, rub or gallop GI: soft, non-tender; bowel sounds normal; no masses,  no organomegaly Extremities: extremities normal, atraumatic, no cyanosis or edema Mouth: Reveals thrush  Lab Results:   Basename 12/18/11 0455 12/17/11 0400  WBC 3.8* 2.2*  HGB 8.2* 8.5*  HCT 23.6* 25.3*  PLT 126* 125*   BMET  Basename 12/18/11 0455 12/17/11 0555  NA 130* 129*  K 3.7 4.0  CL 97 95*  CO2 26 26  GLUCOSE 106* 107*  BUN 20 15    CREATININE 0.93 0.79  CALCIUM 8.2* 8.3*    Studies/Results: Dg Chest Port 1 View  12/17/2011  *RADIOLOGY REPORT*  Clinical Data: Fever and weakness.  PORTABLE CHEST - 1 VIEW  Comparison: 12/12/2011  Findings: PICC tip is at the cavoatrial junction in good position. Heart size and vascularity are normal and the lungs are clear.  No effusions.  No acute osseous abnormality.  IMPRESSION: No acute disease in the chest.   Original Report Authenticated By: Gwynn Burly, M.D.     Medications: I have reviewed the patient's current medications.  Assessment/Plan: Patient is a pleasant 78 year old white male with stage IIIB non-small cell lung cancer, squamous cell carcinoma. He is status post 6 weeks of concurrent chemoradiation. Given his current decreased performance status, we recommend that his radiation therapy be held during this admission and continued on an outpatient basis when he is stronger. For the oral candidiasis continue Diflucan therapy as ordered. Agree with broad spectrum antibiotic coverage. His hemoglobin is drifting down as evidenced by today's value of 8.2. Recommend close monitoring of the hemoglobin and hematocrit, and may require further red blood cell transfusion if the hemoglobin falls below 8.0 g/dL. He will be due to followup with Dr. Arbutus Ped in one month after finishing his radiation therapy with a repeat staging CT scan of the chest. We'll make arrangements for this followup visit once he has been discharged from the hospital. Would utilize her pain medication on an as-needed basis only to decrease recurrence/persistence of urinary retention.  LOS: 6 days    Marlana Salvage 12/18/2011 2:57 PM  Hematology/oncology attending: The patient is seen and examined today. I agree with the above note. His wife was at the bedside. He is feeling a little bit better today and was able to eat part of his breakfast. He continues to have mild dysphagia and odynophagia. He is  currently on viscous lidocaine as well as Dilaudid which she takes every 3 hours. He is currently on TNA for nutritional support. He had febrile episode yesterday and was started on empiric antibiotics but he is feeling much better today. He is expected to be discharged from the hospital within the next few days. He has oral thrush and will continue on fluconazole.

## 2011-12-18 NOTE — Progress Notes (Signed)
[facility] PROGRESS NOTE  Justin Pittman ZOX:[id number] DOB: 1932/11/27 DOA: 12/12/2011 PCP: [name], MD  Brief narrative: 78 year old male with history of stage IIIB non-small cell lung cancer, squamous cell carcinoma with right lower lobe lung mass as well as left hilar lymphadenopathy (diagnosed in August of 2013) receiving chemoradiation with weekly carboplatin and paclitaxel, status post 6 weekly doses of treatment last dose was given on 12/05/2011. Patient presented 12/12/2011 for intractable nausea and vomiting associated with pain and difficulty with swallowing. Patient is receiving TNA for temporary nutritional support. Patietn ahs spiked fever yesterday afternoon and we have started empiric antibiotics while we are awaiting blood culture results.  Assessment/Plan:   Principal Problem:  *Radiation esophagitis  We will continue fluconazole but will increase the dosage to 200 mg daily We will continue Protonix 40 mg IV daily  We willcontinue lidocaine viscous solution , changed to Q 4 hours scheduled at least for today   Active Problems:  Non-small cell lung cancer  Appreciate oncology following  Further radiation and chemotherapy on hold; please note that the family does not want to proceed with further radiation treatments until the patient gets better Pancytopenia  Likely secondary to sequela of chemotherapy and malignancy  No signs of active bleed  Anemia of chronic disease  Likely sequela of chemotherapy and malignancy  Hemoglobin stable  Since admission total of 1 unit PRBC's transfused Hypokalemia/ Hypomagnesemia  Potassium and magnesium are within normal limits  Phosphate level is WNL Protein calorie malnutrition  Secondary to malignancy and poor oral intake due to radiation esophagitis  On TNA Follow up SLP evaluation Hypertension  BP 102/54 D/C hydralazine DVT prophylaxis  Lovenox subcutaneous D/Ced due to thrombocytopenia  Continue SCD  bialterally  Code Status: full code  Family Communication: Son and wife at bedside updated 12/14/2011  Disposition Plan: home when stable; possible transfer to stepdown unit if remains hypotensive  Manson Passey, MD  [facility]  Pager [phone]   Consultants:  Oncology  Antibiotics:  Fluconazole 12/15/2011 --> Procedures  None   HPI/Subjective: Appeared to be more confused in past 24 hours per family member. Patient spiked fever yesterday afternoon and BP was on soft side.  Objective: Filed Vitals:   12/18/11 0238 12/18/11 0504 12/18/11 0541 12/18/11 0633  BP:  91/45 82/44 102/54  Pulse:  91 88   Temp: 98 F (36.7 C) 98.3 F (36.8 C)    TempSrc:  Oral    Resp:  16    Height:      Weight:      SpO2:  96% 98%     Intake/Output Summary (Last 24 hours) at 12/18/11 0731 Last data filed at 12/18/11 0655  Gross per 24 hour  Intake     80 ml  Output   3915 ml  Net  -3835 ml    Exam:   General:  Pt is sleeping  Cardiovascular: Regular rate and rhythm, S1/S2, no murmurs, no rubs, no gallops  Respiratory: crackles at bases; some wheezing appreciated over mid lung lobes but poor inspiratory effort by patient  Abdomen: Soft, non tender, non distended, bowel sounds present, no guarding  Extremities: No edema, pulses DP and PT palpable bilaterally  Neuro: Grossly nonfocal  Data Reviewed: Basic Metabolic Panel:  Lab 12/18/11 8295 12/17/11 0555 12/16/11 0531 12/15/11 0650 12/14/11 0640  NA 130* 129* 131* 131* 134*  K 3.7 4.0 3.4* 3.1* 3.2*  CL 97 95* 96 97 101  CO2 26 26 28 27  25  GLUCOSE 106* 107* 116* 111* 126*  BUN 20 15 12 11 16   CREATININE 0.93 0.79 0.69 0.63 0.68  CALCIUM 8.2* 8.3* 8.1* 7.8* 7.9*  MG 1.8 1.5 -- 1.4* 1.4*  PHOS 4.5 -- -- 2.3 1.5*   Liver Function Tests:  Lab 12/18/11 0455 12/15/11 0650 12/14/11 0640  AST 13 12 10   ALT 8 7 8   ALKPHOS 71 77 80  BILITOT 0.5 0.6 0.3  PROT 4.9* 5.0* 5.1*  ALBUMIN 2.3* 2.5* 2.6*   CBC:  Lab 12/18/11 0455  12/17/11 0400 12/16/11 0531 12/15/11 0650 12/14/11 0640  WBC 3.8* 2.2* 2.7* 2.4* 2.8*  HGB 8.2* 8.5* 9.4* 8.7* 7.9*  HCT 23.6* 25.3* 26.5* 24.2* 22.2*  MCV 79.2 82.1 78.4 78.1 78.7  PLT 126* 125* 130* 118* 125*   CBG:  Lab 12/17/11 2356 12/17/11 1552 12/17/11 0747 12/16/11 2347 12/16/11 1630  GLUCAP 136* 104* 130* 126* 118*    Studies: Dg Chest Port 1 View 12/17/2011  * IMPRESSION: No acute disease in the chest.   Original Report Authenticated By: Gwynn Burly, M.D.     Scheduled Meds:   . fluconazole (DIFLUCAN)   200 mg Intravenous Q24H  . insulin aspart  0-15 Units Subcutaneous Q8H  . lidocaine  20 mL Mouth/Throat Q4H  . magnesium sulfate   2 g Intravenous Once  . pantoprazole  40 mg Intravenous Q24H  . phosphorus  250 mg Oral Daily  . piperacillin-tazobactam  3.375 g Intravenous Q8H  . vancomycin  750 mg Intravenous Q12H   Continuous Infusions:   . sodium chloride 100 mL/hr at 12/18/11 0030  . TPN (CLINIMIX) +/- additives 60 mL/hr at 12/17/11 0700  . TPN (CLINIMIX) +/- additives 80 mL/hr at 12/17/11 1747

## 2011-12-18 NOTE — Evaluation (Signed)
Clinical/Bedside Swallow Evaluation Patient Details  Name: Justin Pittman MRN: [id number] Date of Birth: Mar 13, 1932  Today's Date: 12/18/2011 Time: 4098-1191 SLP Time Calculation (min): 44 min  Past Medical History:  Past Medical History  Diagnosis Date  . HTN (hypertension)   . Lung cancer     Squamous Cell  . Vertigo 2010    Hospitalized a [facility]  . Hyperlipidemia   . Lung cancer    Past Surgical History:  Past Surgical History  Procedure Date  . Tonsillectomy   . Video bronchoscopy 09/20/2011    Procedure: VIDEO BRONCHOSCOPY WITHOUT FLUORO;  Surgeon: Nyoka Cowden, MD;  Location: [facility];  Service: Cardiopulmonary;  Laterality: Bilateral;  . Tonsillectomy    HPI:  78 yo male adm to [facility] 12/12/11 with intractable n/v, dizziness, weakness and dysphagia, odynophagia - pt found to have radiation esophagitis.  Pt diagnosed with stage IIIB non-small cell lung cancer, squamous cell carcinoma currently undergoing a course of concurrent chemotherapy and radiation therapy. His chemotherapy is in the form of weekly carboplatin for an AUC of 2 and paclitaxel at 45 mg region squared, status post 6 weeks of chemotherapy. He currently has radiation therapy scheduled through 12/22/2011 but radiation was put on hold during admission. Pt reported dysphagia and SLP eval was ordered by hospitalist.  Pt states odynophagia is mildly better with the viscous lidocaine to be taken approx five minutes before meal (per pt). Urinary retention,yesterday reported by wife that resolved with foley catheter placement.  Wife further states pt's pain medications have been decreased and this has resulted in improved mental status.  CXR 10/20 was negative for acute findings.     Assessment / Plan / Recommendation Clinical Impression  Clinical swallow evaluation completed: suspect decr intake is multifactorial:  pt reports pain with swallowing as primary barrier to consuming po- but also poor appetite.  Pt pointed to  right side of distal sternum to localize pain- verbalizing that pain is near his lung mass.  Encouraged pt to consume po for evaluation but after he swallowed water, he politely declined furhter po stating it was too painful.  Pt educated to consume room temperature or warm foods/drinks that are not acidic or spicy.  His spouse reports he masticated a mandarin orange today well but had pain with swallowing- ? acidity factor.  SLP conducted cranial nerve exam that was unremarkable .  Observed pt to drink water via cup - swallow was timely and voice clear - delayed cough observed- which likely is due to radiation esophagitis.  SLP does not suspect pt has oropharyngeal deficit- based on clinical observation.  Pt states he coughs with any po intake he consumes since admission.  Advised pt and spouse to foods and drinks that may be better tolerated with his esophagitis and to aspiration precautions.  Pt reports trying to take lidocaine prior to meals per MD to help with pain.  Note pt also being treated for oral thrush.    Rec continue regular/thin diet - encouraging pt to consume small frequent meals - avoiding acidic/spicy foods with strict precautions.    Per spouse, pharmarcy plans to slow rate of TPN in hopes of improving pt's appetite.  Pt did verbalize he does not feel hungry or thirsty, but at times has xerostomia.  Pt and spouse verbalized understanding to information provided. SLP to follow up one time later in week to assure education completed with pt/spouse.    Hopeful for intake to improve with radiation esophagitis resolution- spouse reports pt  not to receive more radiation tx currently.       Aspiration Risk  Mild    Diet Recommendation Regular;Dysphagia 3 (Mechanical Soft);Thin liquid   Liquid Administration via: Cup Medication Administration: Via alternative means (or as tolerated, ? large pills w/applesauce follow w/water) Compensations: Slow rate;Small sips/bites (consume small amounts  throughout the day) Postural Changes and/or Swallow Maneuvers: Seated upright 90 degrees;Upright 30-60 min after meal    Other  Recommendations Oral Care Recommendations: Oral care QID   Follow Up Recommendations       Frequency and Duration min 1 x/week  1 week   Pertinent Vitals/Pain Afebrile, decreased     SLP Swallow Goals Patient will utilize recommended strategies during swallow to increase swallowing safety with: Minimal assistance   Swallow Study Prior Functional Status       General Date of Onset: 12/18/11 HPI: 77 yo male adm to [facility] 12/12/11 with intractable n/v, dizziness, weakness and dysphagia, odynophagia - pt found to have radiation esophagitis.  Pt diagnosed with stage IIIB non-small cell lung cancer, squamous cell carcinoma currently undergoing a course of concurrent chemotherapy and radiation therapy. His chemotherapy is in the form of weekly carboplatin for an AUC of 2 and paclitaxel at 45 mg region squared, status post 6 weeks of chemotherapy. He currently has radiation therapy scheduled through 12/22/2011 but radiation was put on hold during admission. Pt reported dysphagia and SLP eval was ordered by hospitalist.  Pt states odynophagia is mildly better with the viscous lidocaine to be taken approx five minutes before meal (per pt). Urinary retention,yesterday reported by wife that resolved with foley catheter placement.  Wife further states pt's pain medications have been decreased and this has resulted in improved mental status.  CXR 10/20 was negative for acute findings.   Type of Study: Bedside swallow evaluation Diet Prior to this Study: Regular;Thin liquids Temperature Spikes Noted: No Respiratory Status: Room air History of Recent Intubation: No Behavior/Cognition: Alert;Cooperative;Pleasant mood Oral Cavity - Dentition: Missing dentition (has dentures, has four upper teeth) Self-Feeding Abilities: Able to feed self Patient Positioning: Upright in  bed Baseline Vocal Quality: Clear Volitional Cough:  (pt reports pain with coughing - nonproductive cough per pt) Volitional Swallow: Able to elicit    Oral/Motor/Sensory Function Overall Oral Motor/Sensory Function: Other (comment) (generalized weakness-o/w no focal cranial nerve deficit)   Ice Chips Ice chips: Not tested   Thin Liquid Thin Liquid: Impaired Presentation: Cup;Self Fed Pharyngeal  Phase Impairments: Cough - Delayed Other Comments: room temperature water- a small single sip - delayed cough observed which pt states occurs with all intake- suspect due to radiation esophagitis- swallow was timely with adequate laryngeal elevation    Nectar Thick Nectar Thick Liquid: Not tested   Honey Thick Honey Thick Liquid: Not tested   Puree Puree: Not tested Other Comments: due to pt pain and declining intake   Solid   GO    Solid: Not tested Other Comments: due to pt pain and Pt declining po      Donavan Burnet, MS [facility] SLP [phone]

## 2011-12-19 ENCOUNTER — Ambulatory Visit: Payer: Medicare Other

## 2011-12-19 LAB — CBC
HCT: 24.9 % — ABNORMAL LOW (ref 39.0–52.0)
Hemoglobin: 8.5 g/dL — ABNORMAL LOW (ref 13.0–17.0)
MCHC: 34.1 g/dL (ref 30.0–36.0)
MCV: 79.8 fL (ref 78.0–100.0)
RDW: 16.7 % — ABNORMAL HIGH (ref 11.5–15.5)

## 2011-12-19 LAB — BASIC METABOLIC PANEL
BUN: 18 mg/dL (ref 6–23)
Creatinine, Ser: 0.9 mg/dL (ref 0.50–1.35)
Glucose, Bld: 127 mg/dL — ABNORMAL HIGH (ref 70–99)
Potassium: 4.3 mEq/L (ref 3.5–5.1)
Sodium: 135 mEq/L (ref 135–145)

## 2011-12-19 LAB — GLUCOSE, CAPILLARY: Glucose-Capillary: 139 mg/dL — ABNORMAL HIGH (ref 70–99)

## 2011-12-19 MED ORDER — CLINIMIX E/DEXTROSE (5/20) 5 % IV SOLN
INTRAVENOUS | Status: AC
  Administered 2011-12-19: 18:00:00 via INTRAVENOUS
  Filled 2011-12-19: qty 2000

## 2011-12-19 MED ORDER — LEVOFLOXACIN IN D5W 750 MG/150ML IV SOLN
750.0000 mg | INTRAVENOUS | Status: DC
  Administered 2011-12-19 – 2011-12-24 (×6): 750 mg via INTRAVENOUS
  Filled 2011-12-19 (×6): qty 150

## 2011-12-19 NOTE — Progress Notes (Addendum)
[facility] PROGRESS NOTE  Justin Pittman ZOX:[id number] DOB: 07-Feb-1933 DOA: 12/12/2011 PCP: [name], MD  Brief narrative: 78 year old male with history of stage IIIB non-small cell lung cancer, squamous cell carcinoma with right lower lobe lung mass as well as left hilar lymphadenopathy (diagnosed in August of 2013) receiving chemoradiation with weekly carboplatin and paclitaxel, status post 6 weekly doses of treatment last dose was given on 12/05/2011. Patient presented 12/12/2011 with intractable nausea and vomiting associated with pain and difficulty with swallowing which was determined to be secondary to radiation esophagitis and oral thrush. Patient is receiving TNA for temporary nutritional support. Hospital course complicated by the development of possible aspiration pneumonia for which reason patient was started on empiric vanco and zosyn.   Assessment/Plan:   Principal Problem:  *Radiation esophagitis in the setting of non small cell lung carcinoma status post radiation therapy We will continue fluconazole 200 mg daily We will continue Protonix 40 mg IV daily  We will continue lidocaine viscous solution , changed to Q 4 hours scheduled   Active Problems:  Aspiration pneumonia  Still has difficulty swallowing  Follow up SLP evaluation - recommended dysphagia 3 diet  Encourage po intake  We will discontinue vanco and zosyn as patient remains afebrile and blood cultures to date are negative  We will start Levaquin in the anticipation of discharge in next 1-2 days Non-small cell lung cancer  Appreciate oncology following  Further radiation and chemotherapy on hold; please note that the family does not want to proceed with further radiation treatments until the patient gets better Pancytopenia  Likely secondary to sequela of chemotherapy and malignancy  No signs of active bleed  Anemia of chronic disease  Likely sequela of chemotherapy and malignancy    Hemoglobin stable around 8 No signs of active bleed Since admission total of 1 unit PRBC's transfused Hypokalemia/ Hypomagnesemia  Potassium and magnesium are within normal limits  Phosphate level is WNL Protein calorie malnutrition  Secondary to malignancy and poor oral intake due to radiation esophagitis  Still receiving  TNA  Follow up SLP evaluation - recommended dysphagia 3 diet Hypertension  BP 127/68 Discontinued hydralazine DVT prophylaxis  Lovenox subcutaneous discontinued due to thrombocytopenia  Continue SCD bialterally  Code Status: full code  Family Communication: Son and wife at bedside updated 12/14/2011  Disposition Plan: awaiting PT evaluation, perhaps home in next 24-48 hours  Manson Passey, MD  [facility]  Pager [phone]   Consultants:  Oncology   Antibiotics:  Fluconazole 12/15/2011 --> Vanco and zosyn discontinue 12/19/2011 Levaquin 12/19/2011 -->  Procedures  None   HPI/Subjective: No acute events overnight.  Objective: Filed Vitals:   12/18/11 1441 12/18/11 2202 12/18/11 2251 12/19/11 0640  BP: 121/54 161/109 138/70 127/68  Pulse: 83 89  97  Temp: 98.1 F (36.7 C) 98 F (36.7 C)  98 F (36.7 C)  TempSrc: Oral Oral  Oral  Resp: 18 16  16   Height:      Weight:      SpO2: 97% 98%  97%    Intake/Output Summary (Last 24 hours) at 12/19/11 0859 Last data filed at 12/19/11 0543  Gross per 24 hour  Intake 5092.66 ml  Output   3900 ml  Net 1192.66 ml    Exam:   General:  Pt is alert, follows commands appropriately, not in acute distress; pale and ill appearing  Cardiovascular: Regular rate and rhythm, S1/S2, no murmurs, no rubs, no gallops  Respiratory: Clear to auscultation bilaterally, no  wheezing, no crackles, no rhonchi  Abdomen: Soft, non tender, non distended, bowel sounds present, no guarding  Extremities: No edema, pulses DP and PT palpable bilaterally  Neuro: Grossly nonfocal  Data Reviewed: Basic Metabolic Panel:  Lab  12/19/11 0535 12/18/11 0455 12/17/11 0555 12/16/11 0531 12/15/11 0650 12/14/11 0640  NA 135 130* 129* 131* 131* --  K 4.3 3.7 4.0 3.4* 3.1* --  CL 100 97 95* 96 97 --  CO2 27 26 26 28 27  --  GLUCOSE 127* 106* 107* 116* 111* --  BUN 18 20 15 12 11  --  CREATININE 0.90 0.93 0.79 0.69 0.63 --  CALCIUM 7.2* 8.2* 8.3* 8.1* 7.8* --  MG -- 1.8 1.5 -- 1.4* 1.4*  PHOS -- 4.5 -- -- 2.3 1.5*   Liver Function Tests:  Lab 12/18/11 0455 12/15/11 0650 12/14/11 0640  AST 13 12 10   ALT 8 7 8   ALKPHOS 71 77 80  BILITOT 0.5 0.6 0.3  PROT 4.9* 5.0* 5.1*  ALBUMIN 2.3* 2.5* 2.6*   CBC:  Lab 12/19/11 0535 12/18/11 0455 12/17/11 0400 12/16/11 0531 12/15/11 0650  WBC 4.0 3.8* 2.2* 2.7* 2.4*  HGB 8.5* 8.2* 8.5* 9.4* 8.7*  HCT 24.9* 23.6* 25.3* 26.5* 24.2*  PLT 130* 126* 125* 130* 118*   CBG:  Lab 12/19/11 0823 12/19/11 0012 12/18/11 1628 12/18/11 1214 12/18/11 0728  GLUCAP 142* 141* 131* 120* 143*    URINE CULTURE     Status: Normal   Collection Time   12/17/11  7:04 PM      Component Value Range Status Comment   Specimen Description URINE  Final    Culture NO GROWTH   Final    Report Status 12/18/2011 FINAL   Final   CULTURE, BLOOD (ROUTINE X 2)     Status: Normal (Preliminary result)   Collection Time   12/17/11  7:28 PM      Component Value Range Status Comment   Culture     Final    Value:        BLOOD CULTURE RECEIVED NO GROWTH TO DATE    Report Status PENDING   Incomplete   CULTURE, BLOOD (ROUTINE X 2)     Status: Normal (Preliminary result)   Collection Time   12/17/11  7:35 PM      Component Value Range Status Comment   Culture     Final    Value:        BLOOD CULTURE RECEIVED NO GROWTH TO DATE    Report Status PENDING   Incomplete      Studies: Dg Chest Port 1 View 12/17/2011  *IMPRESSION: No acute disease in the chest.   Original Report Authenticated By: Gwynn Burly, M.D.     Scheduled Meds:   . fluconazole (DIFLUCAN)   200 mg Intravenous Q24H  . insulin aspart   0-15 Units Subcutaneous Q8H  . lidocaine  20 mL Mouth/Throat TID AC  . pantoprazole   40 mg Intravenous Q24H  . phosphorus  250 mg Oral Daily   Continuous Infusions:   . sodium chloride 50 mL/hr at 12/18/11 1940  . TPN (CLINIMIX) +/- additives 80 mL/hr at 12/18/11 1810   And  . fat emulsion 250 mL (12/18/11 1810)  . TPN (CLINIMIX) +/- additives 80 mL/hr at 12/17/11 1747  . TPN (CLINIMIX) +/- additives    . DISCONTD: sodium chloride 100 mL/hr (12/18/11 0530)

## 2011-12-19 NOTE — Progress Notes (Signed)
ANTIBIOTIC CONSULT NOTE  Pharmacy Consult for Vancomycin/Zosyn Indication: rule out pneumonia  No Known Allergies  Patient Measurements: Height: 5\' 10"  (177.8 cm) Weight: 135 lb 12.8 oz (61.598 kg) IBW/kg (Calculated) : 73   Vital Signs: Temp: 98.3 F (36.8 C) (10/22 1300) Temp src: Oral (10/22 1300) BP: 137/66 mmHg (10/22 1300) Pulse Rate: 89  (10/22 1300) Intake/Output from previous day: 10/21 0701 - 10/22 0700 In: 5092.7 [P.O.:80; I.V.:885.8; TPN:4126.8] Out: 3900 [Urine:3900] Intake/Output from this shift: Total I/O In: 1493 [P.O.:60; I.V.:405; IV Piggyback:300; TPN:728] Out: 1000 [Urine:1000]  Labs:  [facility] 12/19/11 0535 12/18/11 0455 12/17/11 0555 12/17/11 0400  WBC 4.0 3.8* -- 2.2*  HGB 8.5* 8.2* -- 8.5*  PLT 130* 126* -- 125*  LABCREA -- -- -- --  CREATININE 0.90 0.93 0.79 --   Estimated Creatinine Clearance: 59.9 ml/min (by C-G formula based on Cr of 0.9).   Microbiology: Recent Results (from the past 720 hour(s))  URINE CULTURE     Status: Normal   Collection Time   12/17/11  7:04 PM      Component Value Range Status Comment   Specimen Description URINE, CLEAN CATCH   Final    Special Requests Immunocompromised   Final    Culture  Setup Time 12/17/2011 23:02   Final    Colony Count NO GROWTH   Final    Culture NO GROWTH   Final    Report Status 12/18/2011 FINAL   Final   CULTURE, BLOOD (ROUTINE X 2)     Status: Normal (Preliminary result)   Collection Time   12/17/11  7:28 PM      Component Value Range Status Comment   Specimen Description BLOOD LEFT ARM  10 ML IN [facility] BOTTLE   Final    Special Requests Immunocompromised   Final    Culture  Setup Time 12/18/2011 10:16   Final    Culture     Final    Value:        BLOOD CULTURE RECEIVED NO GROWTH TO DATE CULTURE WILL BE HELD FOR 5 DAYS BEFORE ISSUING A FINAL NEGATIVE REPORT   Report Status PENDING   Incomplete   CULTURE, BLOOD (ROUTINE X 2)     Status: Normal (Preliminary result)   Collection  Time   12/17/11  7:35 PM      Component Value Range Status Comment   Specimen Description BLOOD LEFT ARM  10 ML IN [facility] BOTTLE   Final    Special Requests Immunocompromised   Final    Culture  Setup Time 12/18/2011 10:16   Final    Culture     Final    Value:        BLOOD CULTURE RECEIVED NO GROWTH TO DATE CULTURE WILL BE HELD FOR 5 DAYS BEFORE ISSUING A FINAL NEGATIVE REPORT   Report Status PENDING   Incomplete    Assessment:  89 yoM with h/o NSCLC s/p chemoradiation known to pharmacy for current TNA therapy protocol due to dysphagia and odynophagia 2/2 radiation induced esophagitis.    Today is day #3 vancomycin and zosyn, which MD is now changing to levaquin  Also day #8 fluconazole for esophagitis  CrCl >50 ml/min, therefore standard dosing for pneumonia is appropriate  Goal of Therapy:  Eradication of infection  Plan:   Levaquin 750mg  IV q24h  F/u cultures, T, renal fxn, and duration of therapy  Loralee Pacas, PharmD, BCPS Pager: [phone] 12/19/2011,3:07 PM

## 2011-12-19 NOTE — Progress Notes (Signed)
PT Cancellation Note  Patient Details Name: Dunbar Buras MRN: [id number] DOB: 01-22-1933   Cancelled Treatment:    Reason Eval/Treat Not Completed: Fatigue/lethargy limiting ability to participate (pt sleeping soundly after receiving pain medication) Son requested PT hold this morning. Will re-attempt this afternoon.   Ralene Bathe Kistler 12/19/2011, 11:24 AM

## 2011-12-19 NOTE — Progress Notes (Signed)
Physical Therapy Treatment Patient Details Name: Justin Pittman MRN: [id number] DOB: 1932-06-16 Today's Date: 12/19/2011 Time: 4098-1191 PT Time Calculation (min): 10 min  PT Assessment / Plan / Recommendation Comments on Treatment Session  Pt required encouragement to participate in PT, but did agree to a short walk. Pt did well with ambulating 65' holding IV pole. No LOB. Distance limited by fatigue.     Follow Up Recommendations  Home health PT     Does the patient have the potential to tolerate intense rehabilitation     Barriers to Discharge        Equipment Recommendations  Rolling walker with 5" wheels;3 in 1 bedside comode    Recommendations for Other Services    Frequency Min 3X/week   Plan Discharge plan remains appropriate;Frequency remains appropriate    Precautions / Restrictions Precautions Precautions: Fall Restrictions Weight Bearing Restrictions: No   Pertinent Vitals/Pain *0/10**    Mobility  Bed Mobility Supine to Sit: 6: Modified independent (Device/Increase time);With rails;HOB elevated Transfers Sit to Stand: From bed;With upper extremity assist;5: Supervision Stand to Sit: 6: Modified independent (Device/Increase time);To chair/3-in-1;With armrests;With upper extremity assist Ambulation/Gait Ambulation/Gait Assistance: 5: Supervision Ambulation Distance (Feet): 65 Feet Assistive device: Other (Comment) (IV pole ) Gait Pattern: Within Functional Limits General Gait Details: No LOB, pt steady holding IV pole, distance limited by fatigue Stairs: No    Exercises     PT Diagnosis:    PT Problem List:   PT Treatment Interventions:     PT Goals Acute Rehab PT Goals PT Goal Formulation: With patient Time For Goal Achievement: 12/28/11 Potential to Achieve Goals: Good Pt will go Supine/Side to Sit: with supervision PT Goal: Supine/Side to Sit - Progress: Met Pt will go Sit to Stand: with supervision PT Goal: Sit to Stand - Progress: Met Pt  will go Stand to Sit: with supervision PT Goal: Stand to Sit - Progress: Met Pt will Ambulate: with rolling walker;>150 feet;with supervision PT Goal: Ambulate - Progress: Updated due to goal met  Visit Information  Last PT Received On: 12/19/11 Assistance Needed: +1 Reason Eval/Treat Not Completed: Fatigue/lethargy limiting ability to participate (pt sleeping soundly after receiving pain medication)    Subjective Data  Subjective: It feels good to walk a little.  Patient Stated Goal: to go home   Cognition  Overall Cognitive Status: Appears within functional limits for tasks assessed/performed Arousal/Alertness: Awake/alert Orientation Level: Appears intact for tasks assessed Behavior During Session: [facility] for tasks performed    Balance     End of Session PT - End of Session Activity Tolerance: Patient tolerated treatment well Patient left: in chair;with call bell/phone within reach Nurse Communication: Mobility status   GP     Ralene Bathe Kistler 12/19/2011, 1:19 PM [phone]

## 2011-12-19 NOTE — Progress Notes (Signed)
Occupational Therapy Treatment Patient Details Name: Justin Pittman MRN: [id number] DOB: 1932-08-20 Today's Date: 12/19/2011 Time: 4098-1191 OT Time Calculation (min): 31 min  OT Assessment / Plan / Recommendation Comments on Treatment Session      Follow Up Recommendations  No OT follow up (wife will assist pt)    Barriers to Discharge       Equipment Recommendations  Rolling walker with 5" wheels;3 in 1 bedside comode    Recommendations for Other Services    Frequency Min 2X/week   Plan      Precautions / Restrictions Precautions Precautions: Fall Restrictions Weight Bearing Restrictions: No   Pertinent Vitals/Pain No c/o pain    ADL  Lower Body Bathing: Performed;Minimal assistance (pt fatiqued) Where Assessed - Lower Body Bathing: Supported sit to stand Lower Body Dressing: Performed;Minimal assistance (for catheter line; did not change socks) Where Assessed - Lower Body Dressing: Supported sit to stand Toilet Transfer: Performed;Min Pension scheme manager Method: Sit to Barista: Bedside commode Transfers/Ambulation Related to ADLs: ambulate to bathroom holding onto IV pole  Min guard for gathering items ADL Comments: Pt fatiqued; continued energy conservation education.  Pt did initiate rest breaks and sat for most of ADL    OT Diagnosis:    OT Problem List:   OT Treatment Interventions:     OT Goals Acute Rehab OT Goals Time For Goal Achievement: 12/29/11 ADL Goals Pt Will Transfer to Toilet: with modified independence;Ambulation;Regular height toilet ADL Goal: Toilet Transfer - Progress: Progressing toward goals Miscellaneous OT Goals Miscellaneous OT Goal #1: Pt will retrieve clothes at mod I level with AD and complete ADL routine  OT Goal: Miscellaneous Goal #1 - Progress: Progressing toward goals Miscellaneous OT Goal #2: Pt will verbalize 3 energy conservation techniques OT Goal: Miscellaneous Goal #2 - Progress: Progressing  toward goals  Visit Information  Last OT Received On: 12/19/11 Assistance Needed: +1    Subjective Data      Prior Functioning       Cognition  Overall Cognitive Status: Appears within functional limits for tasks assessed/performed Arousal/Alertness: Awake/alert Orientation Level: Appears intact for tasks assessed Behavior During Session: [facility] for tasks performed    Mobility  Shoulder Instructions Bed Mobility Supine to Sit: 6: Modified independent (Device/Increase time);With rails;HOB elevated Transfers Sit to Stand: From bed;With upper extremity assist;5: Supervision       Exercises      Balance     End of Session OT - End of Session Activity Tolerance: Patient limited by fatigue Patient left: in bed;with call bell/phone within reach  GO     [facility] 12/19/2011, 9:29 AM Marica Otter, OTR/L [phone] 12/19/2011

## 2011-12-19 NOTE — Care Management Note (Addendum)
    Page 1 of 2   12/26/2011     12:23:39 PM   CARE MANAGEMENT NOTE 12/26/2011  Patient:  Justin Pittman, Justin Pittman   Account Number:  [id number]  Date Initiated:  12/19/2011  Documentation initiated by:  Konrad Felix  Subjective/Objective Assessment:   Admitted with febrile esophagitis.     Action/Plan:   Discharge to home when medically stable.   Anticipated DC Date:  12/20/2011   Anticipated DC Plan:  HOME W HOME HEALTH SERVICES  In-house referral  Clinical Social Worker  Ethics Consult  Hospice / Palliative Care      DC Planning Services  CM consult      [facility] Choice  LONG TERM ACUTE CARE   Choice offered to / List presented to:  C-3 Spouse           Status of service:  Completed, signed off Medicare Important Message given?   (If response is "NO", the following Medicare IM given date fields will be blank) Date Medicare IM given:   Date Additional Medicare IM given:    Discharge Disposition:    Per UR Regulation:  Reviewed for med. necessity/level of care/duration of stay  If discussed at Long Length of Stay Meetings, dates discussed:   12/19/2011    Comments:  12/26/2011  12:15pm  Konrad Felix RN, case manager [phone] CM and SW met with patient's son outside of the patient's room (wife was inside speaking with the attending nurse). Informed son that as long as procedures for the peg tube placement go as predicted to expect discharge on Thursday. Reiterated that he will be discharged to a SNF, however, if they have not designated a SNF then he would be discharged to home with home health care. Son expressed his understanding agreement as well as stating he would inform his mother.  12/22/2011  4:30pm  Konrad Felix RN, case manager   [phone] Spoke with patient's son to answer his questions about home health and providing for TPN in the home. I answered all of his questions to his satisfaction and he thanked me for takiing the time to speak with him. The son stated they would  be touring the two facilities over the weekend and will have an answer on Monday. I have reported my discussion to Dr. Darnelle Catalan.  12/20/2011  10:30am  Konrad Felix RN, case manager [phone] Received call back from [place] [facility]. Unfortunately, due to patient's insurance he does not have any long term care benefits. [facility] will not be able to accept this patient.  12/19/2011  4:05pm  Konrad Felix RN, case manager   [phone] Spoke with Dr. Elisabeth Pigeon about discharge plans. Family states they cannot care for him at home with so many lines and weakness. Wife has atrial fibrillation. Discussed possibility of SNF or [facility]. I have placed a referral to social work for SNF and to [facility] for evaluation.  12/19/2011  Continued fever spikes, nausea and vomiting, stop all treatment, on TNA, possible home with IVs through home health.

## 2011-12-19 NOTE — Progress Notes (Signed)
PARENTERAL NUTRITION CONSULT NOTE - FOLLOW UP  Pharmacy Consult for TNA  Indication: Poor oral intake 2/2 radiation esophagitis  No Known Allergies  Patient Measurements: Height: 5\' 10"  (177.8 cm) Weight: 135 lb 12.8 oz (61.598 kg) IBW/kg (Calculated) : 73  Usual Weight: 71 kg   Vital Signs: Temp: 98 F (36.7 C) (10/22 0640) Temp src: Oral (10/22 0640) BP: 127/68 mmHg (10/22 0640) Pulse Rate: 97  (10/22 0640) Intake/Output from previous day: 10/21 0701 - 10/22 0700 In: 5092.7 [P.O.:80; I.V.:885.8; TPN:4126.8] Out: 3900 [Urine:3900] Intake/Output from this shift:    Labs:  [facility] 12/19/11 0535 12/18/11 0455 12/17/11 0400  WBC 4.0 3.8* 2.2*  HGB 8.5* 8.2* 8.5*  HCT 24.9* 23.6* 25.3*  PLT 130* 126* 125*  APTT -- -- --  INR -- -- --     Basename 12/19/11 0535 12/18/11 0455 12/17/11 0555  NA 135 130* 129*  K 4.3 3.7 4.0  CL 100 97 95*  CO2 27 26 26   GLUCOSE 127* 106* 107*  BUN 18 20 15   CREATININE 0.90 0.93 0.79  LABCREA -- -- --  CREAT24HRUR -- -- --  CALCIUM 7.2* 8.2* 8.3*  MG -- 1.8 1.5  PHOS -- 4.5 --  PROT -- 4.9* --  ALBUMIN -- 2.3* --  AST -- 13 --  ALT -- 8 --  ALKPHOS -- 71 --  BILITOT -- 0.5 --  BILIDIR -- -- --  IBILI -- -- --  PREALBUMIN -- 5.6* --  TRIG -- 32 --  CHOLHDL -- -- --  CHOL -- 79 --   Estimated Creatinine Clearance: 59.9 ml/min (by C-G formula based on Cr of 0.9).    Basename 12/19/11 0012 12/18/11 1628 12/18/11 1214  GLUCAP 141* 131* 120*    Insulin Requirements in the past 24 hours:  CBGs < 150, required 6 units moderate SSI Q8h  Nutritional Goals RD rec 10/16: Kcal:1950-2300, Protein: 85-100g, Fluid:1.9-2.3L Clinimix E5/20 at a goal rate of 80 ml/hr with lipids MWF will provide 96 g protein daily and 1689 kcal TTSS, 2169 kcal MWF, and an average of 1790 kcal/day.   Current Nutrition Clinimix E5/20 @ 80 ml/hr Lipids 20% @ 63ml/hr on MWF Regular diet starting 10/15 but poor appetite mIIVF NS @ 50  ml/hr  Asssessment: Justin Pittman with h/o stage IIIB NSCLC s/p concurrent chemoradiation for 6 weeks (carboplatin/paclitaxel) with last doses given 10/813. Patient presented 12/12/2011 for intractable nausea and vomiting associated with dysphagia and thrush secondary to radiation induced esophagitis. TNA started 10/16 for poor oral intake and protein calorie malnutrition. Chemo and radiation currently on hold until improvement in condition.  10/21: Patient currently has regular diet oral but oral intake is poor.Note low BP 10/21 associated with temp spike, NS 500 ml/ bolus given. Urine output 3.9 L past 24 hr, with foley placed for voiding dysfunction.  Labs Renal function: Scr stable/wnl Hepatic function: all wnl Electrolytes: Na+ now WNL, previously low, corrected Calc wnl (8.5), Mag = 1.8, Phos 4.5. (Unable to adjust lytes in pre-mixed TNA) Pre-Albumin: 10.8 (10/17), 5.6 (10/21) TG/Cholesterol: wnl 10/17, 10/21  Plan:  At 1800 tonight  Continue Clinimix E5/20 at goal of 80 ml/hr  TNA to contain IV fat emulsion, standard multivitamins and trace elements only on MWF only due to ongoing shortage  Continue SSI q8h  TNA labs Monday/Thursdays  Pharmacy will follow up daily  Reduce IV NS to 50 ml/hr this am, monitor BP  Tammy Sours, Pharm.D. Clinical Oncology Pharmacist  Pager # [phone]  12/19/2011  8:25 AM

## 2011-12-20 ENCOUNTER — Ambulatory Visit: Payer: Medicare Other

## 2011-12-20 DIAGNOSIS — E876 Hypokalemia: Secondary | ICD-10-CM | POA: Diagnosis present

## 2011-12-20 DIAGNOSIS — J690 Pneumonitis due to inhalation of food and vomit: Secondary | ICD-10-CM | POA: Diagnosis present

## 2011-12-20 LAB — GLUCOSE, CAPILLARY
Glucose-Capillary: 141 mg/dL — ABNORMAL HIGH (ref 70–99)
Glucose-Capillary: 132 mg/dL — ABNORMAL HIGH (ref 70–99)

## 2011-12-20 MED ORDER — ZINC TRACE METAL 1 MG/ML IV SOLN
INTRAVENOUS | Status: AC
  Administered 2011-12-20: 18:00:00 via INTRAVENOUS
  Filled 2011-12-20: qty 2000

## 2011-12-20 MED ORDER — CLINIMIX E/DEXTROSE (5/20) 5 % IV SOLN
INTRAVENOUS | Status: DC
  Filled 2011-12-20: qty 2000

## 2011-12-20 MED ORDER — FAT EMULSION 20 % IV EMUL
250.0000 mL | INTRAVENOUS | Status: AC
  Administered 2011-12-20: 250 mL via INTRAVENOUS
  Filled 2011-12-20: qty 250

## 2011-12-20 NOTE — Progress Notes (Signed)
Nutrition Follow-up  Intervention: TPN per pharmacy. Recommend MD consider bowel regimen as last BM documented on 10/15. Will monitor.   Diet Order: Dysphagia 3, thin liquid  TPN: Clinimix E 5/20 @ 80 ml/hr.  Lipids (20% IVFE @ 10 ml/hr), multivitamins, and trace elements are provided 3 times weekly (MWF) due to national backorder.  Provides 1896 kcal and 96 grams protein daily (based on weekly average).  Meets 97% minimum estimated kcal and 113% minimum estimated protein needs.  Additional IVF with NS @ 50 ml/hr.  - Pt had bedside swallow evaluation on 10/21 and noted pt with mild aspiration risk and pt refusing further PO intake r/t pain. Met with pt today, noted pt with jar of baby food at bedside with container of apple juice. Pt reports only taking sips of apple juice, no other PO intake. Pt reports ongoing pain with swallowing.   - Potassium, phosphorus, and magnesium WNL - CBGs well controlled - PALB trending down 10.8 mg/dL on 78/29 to 5.6 mg/dL on 56/21  Meds: Scheduled Meds:   . fluconazole (DIFLUCAN) IV  200 mg Intravenous Q24H  . insulin aspart  0-15 Units Subcutaneous Q8H  . levofloxacin (LEVAQUIN) IV  750 mg Intravenous Q24H  . pantoprazole (PROTONIX) IV  40 mg Intravenous Q24H  . phosphorus  250 mg Oral Daily  . sodium chloride  500 mL Intravenous Once  . sodium chloride  10-40 mL Intracatheter Q12H  . DISCONTD: lidocaine  20 mL Mouth/Throat TID AC  . DISCONTD: piperacillin-tazobactam (ZOSYN)  IV  3.375 g Intravenous Q8H  . DISCONTD: vancomycin  750 mg Intravenous Q12H   Continuous Infusions:   . sodium chloride 50 mL/hr at 12/18/11 1940  . TPN (CLINIMIX) +/- additives 80 mL/hr at 12/18/11 1810   And  . fat emulsion 250 mL (12/18/11 1810)  . TPN (CLINIMIX) +/- additives 80 mL/hr at 12/19/11 1737  . TPN (CLINIMIX) +/- additives     PRN Meds:.acetaminophen, acetaminophen, HYDROmorphone (DILAUDID) injection, ondansetron (ZOFRAN) IV, ondansetron, senna-docusate,  sodium chloride  Labs:  CMP     Component Value Date/Time   NA 135 12/19/2011 0535   NA 136 12/05/2011 1006   K 4.3 12/19/2011 0535   K 4.1 12/05/2011 1006   CL 100 12/19/2011 0535   CL 101 12/05/2011 1006   CO2 27 12/19/2011 0535   CO2 24 12/05/2011 1006   GLUCOSE 127* 12/19/2011 0535   GLUCOSE 100* 12/05/2011 1006   BUN 18 12/19/2011 0535   BUN 24.0 12/05/2011 1006   CREATININE 0.90 12/19/2011 0535   CREATININE 0.9 12/05/2011 1006   CALCIUM 7.2* 12/19/2011 0535   CALCIUM 9.1 12/05/2011 1006   PROT 4.9* 12/18/2011 0455   PROT 6.4 12/05/2011 1006   ALBUMIN 2.3* 12/18/2011 0455   ALBUMIN 3.7 12/05/2011 1006   AST 13 12/18/2011 0455   AST 14 12/05/2011 1006   ALT 8 12/18/2011 0455   ALT 11 12/05/2011 1006   ALKPHOS 71 12/18/2011 0455   ALKPHOS 152* 12/05/2011 1006   BILITOT 0.5 12/18/2011 0455   BILITOT 0.70 12/05/2011 1006   GFRNONAA 80* 12/19/2011 0535   GFRAA >90 12/19/2011 0535   Prealbumin  Date Value Range Status  12/18/2011 5.6* 17.0 - 34.0 mg/dL Final   CBG (last 3)   Basename 12/20/11 0722 12/20/11 0033 12/19/11 1551  GLUCAP 141* 142* 139*      Intake/Output Summary (Last 24 hours) at 12/20/11 1051 Last data filed at 12/20/11 0500  Gross per 24 hour  Intake  1283 ml  Output   2900 ml  Net  -1617 ml   Last BM - 10/15  Weight Status:  10/15 123 lb 7.3 oz 10/18 135 lb 12.8 oz  Estimated needs:   1950-2300 calories 85-100g protein  Nutrition Dx: Inadequate oral intake - ongoing  Goal: TPN to meet >90% of estimated nutritional needs - met  Monitor: Weights, labs, intake, BM, TPN   Levon Hedger MS, RD, LDN [phone] Pager [phone] After Hours Pager

## 2011-12-20 NOTE — Clinical Social Work Psychosocial (Signed)
     Clinical Social Work Department BRIEF PSYCHOSOCIAL ASSESSMENT 12/20/2011  Patient:  Justin Pittman, Justin Pittman     Account Number:  [id number]     Admit date:  12/12/2011  Clinical Social Worker:  Jacelyn Grip  Date/Time:  12/20/2011 03:00 PM  Referred by:  Care Management  Date Referred:  12/20/2011 Referred for  SNF Placement   Other Referral:   Interview type:  Patient Other interview type:    PSYCHOSOCIAL DATA Living Status:  WIFE Admitted from facility:   Level of care:   Primary support name:  Justin Pittman/[phone] Primary support relationship to patient:  SPOUSE Degree of support available:   adequate    CURRENT CONCERNS Current Concerns  Post-Acute Placement   Other Concerns:    SOCIAL WORK ASSESSMENT / PLAN CSW received notification from [facility] that due to patient's insurance he does not have any long term care benefits. [facility] will not be able to accept this patient and need for exploration for SNF.    CSW met with pt at bedside. Pt spouse not present at this time and pt reports that she had a drs appt of her own. CSW discussed with pt that pt insurance would not cover [facility] and explained to pt about SNF placement. Pt is hesitant to SNF placement, but states that he is unsure pt wife would be able to manage pt care at home. Pt agreeable to exploring option of SNF, but would like to continue conversation with pt wife about if pt and pt wife would be able to manage at home. CSW completed FL2 and initiated SNF search to [facility]. SNF bed offers will be limited due to pt need for TNA. Pt asked that CSW not contact pt spoues at this time due to her drs appt and pt is agreeable to follow up tomorow to present SNF bed options. CSW to continue to follow and assist with SNF placement.   Assessment/plan status:  Psychosocial Support/Ongoing Assessment of Needs Other assessment/ plan:   discharge planning   Information/referral to  community resources:   [facility] list    PATIENTS/FAMILYS RESPONSE TO PLAN OF CARE: Pt alert and oriented, but had poor eye contact during throughout assessment. Pt is hesitant to SNF placement, but recognizes that pt wife would have a difficult time caring for pt at home. Pt agreeable to exploring option for SNF, but still appears to be considering option of returning home.

## 2011-12-20 NOTE — Progress Notes (Signed)
Physical medicine and rehabilitation consult was requested in chart has been reviewed. Patient with history of non-small cell lung cancer, squamous cell carcinoma right lower lobe lobe mass was receiving chemoradiation weekly. Patient was admitted with intractable nausea vomiting. As per latest physical therapy notes patient is ambulating 65 feet with no loss of balance and supervision as well occupational therapy recommending no OT followup. Stated plan is for home health therapies as patient would not qualify at this time for inpatient rehabilitation services. Will hold at this time on formal rehabilitation consult at this time make recommendations for home health therapies.

## 2011-12-20 NOTE — Progress Notes (Signed)
[facility] PROGRESS NOTE  Justin Pittman ZOX:[id number] DOB: 1933-02-26 DOA: 12/12/2011 PCP: [name], MD  Brief narrative: Justin Pittman is a 78 year old male with a PMH of stage IIIB non-small cell lung cancer, squamous cell carcinoma with right lower lobe lung mass as well as left hilar lymphadenopathy (diagnosed in August of 2013) receiving chemoradiation with weekly carboplatin and paclitaxel, status post 6 weekly doses of treatment last dose was given on 12/05/2011. Patient presented 12/12/2011 with intractable nausea and vomiting associated with pain and difficulty with swallowing which was determined to be secondary to radiation esophagitis and oral thrush. Patient is receiving TNA for temporary nutritional support. Hospital course complicated by the development of possible aspiration pneumonia for which reason patient was started on empiric vanco and zosyn, which has now been narrowed to Levaquin. The barrier to discharge is ongoing TNA dependence and poor oral intake.    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  Refusing viscous lidocaine because of nausea.  Still with poor by mouth intake.  May need skilled nursing home placement for ongoing nutritional support. Active Problems:  Aspiration pneumonia   Seen by speech therapist on 12/18/2011: recommended dysphagia 3 diet, thin liquids.  Continue to encourage po intake.   Initially treated with vanco and zosyn, but narrowed to Levaquin based on negative culture data. Non-small cell lung cancer   Seen by Dr. Arbutus Ped on 12/18/2011.   Further radiation and chemotherapy on hold; please note that the family does not want to proceed with further radiation treatments until the patient's symptoms are resolved. Pancytopenia / Anemia of chronic  disease   Likely secondary to sequela of chemotherapy.  White blood cell count is normal, hemoglobin is stable, platelet count is stable. The patient did receive one unit of packed red blood cells on 12/15/2011 Hypokalemia/ Hypomagnesemia   Monitor electrolytes closely and replenishing when indicated. Protein calorie malnutrition   Secondary to malignancy and poor oral intake due to radiation esophagitis.  TNA initiated on 12/13/2011 with ongoing problems with poor oral intake.  Continue dysphagia 3 diet as tolerated. Hypertension   Blood pressure currently controlled.   Code Status: Full Family Communication: Declined my offer to call family. Disposition Plan: SNF placement.   Medical Consultants:  Dr. Si Gaul, Oncology  Billie Ruddy, PA, physical medicine and rehabilitation  Other Consultants:  Physical therapy: Recommend SNF.  Dietitian: Ongoing inadequate oral intake.  Pharmacy: T. and A. and antibiotic dosing.  Procedures:  PICC placement 12/13/2011.  Antibiotics:  Levaquin 12/19/2011--->  Diflucan 12/13/2011--->  Vancomycin 12/17/2011---> 12/19/2011  Zosyn 12/17/2011---> 12/19/2011   HPI/Subjective: Justin Pittman is still having a fair amount of chest discomfort whenever he swallows.  Appetite and ability to swallow still poor.  Has a dry cough.  Some nausea.  Cannot tolerate viscous lidocaine.  Objective: Filed Vitals:   12/19/11 1300 12/19/11 2104 12/20/11 0520 12/20/11 1355  BP: 137/66 131/64 132/52 117/65  Pulse: 89 93 100 93  Temp: 98.3 F (36.8 C) 97.9 F (36.6 C) 97.9 F (36.6 C) 98.1 F (36.7 C)  TempSrc: Oral Oral Oral Oral  Resp: 18 16 16 18   Height:      Weight:      SpO2: 98% 98% 97% 99%    Intake/Output Summary (Last 24 hours) at 12/20/11 1643 Last data filed at 12/20/11 1346  Gross per 24 hour  Intake 3601.33 ml  Output   3700 ml  Net -98.67 ml    Exam: Gen:  NAD Cardiovascular:  RRR, No M/R/G Respiratory:  Lungs CTAB Gastrointestinal: Abdomen soft, NT/ND with normal active bowel sounds. Extremities: No C/E/C  Data Reviewed: Basic Metabolic Panel:  Lab 12/19/11 1610 12/18/11 0455 12/17/11 0555 12/16/11 0531 12/15/11 0650 12/14/11 0640  NA 135 130* 129* 131* 131* --  K 4.3 3.7 -- -- -- --  CL 100 97 95* 96 97 --  CO2 27 26 26 28 27  --  GLUCOSE 127* 106* 107* 116* 111* --  BUN 18 20 15 12 11  --  CREATININE 0.90 0.93 0.79 0.69 0.63 --  CALCIUM 7.2* 8.2* 8.3* 8.1* 7.8* --  MG -- 1.8 1.5 -- 1.4* 1.4*  PHOS -- 4.5 -- -- 2.3 1.5*   GFR Estimated Creatinine Clearance: 59.9 ml/min (by C-G formula based on Cr of 0.9). Liver Function Tests:  Lab 12/18/11 0455 12/15/11 0650 12/14/11 0640  AST 13 12 10   ALT 8 7 8   ALKPHOS 71 77 80  BILITOT 0.5 0.6 0.3  PROT 4.9* 5.0* 5.1*  ALBUMIN 2.3* 2.5* 2.6*    CBC:  Lab 12/19/11 0535 12/18/11 0455 12/17/11 0400 12/16/11 0531 12/15/11 0650 12/14/11 0640  WBC 4.0 3.8* 2.2* 2.7* 2.4* --  NEUTROABS -- 2.6 -- -- -- 2.0  HGB 8.5* 8.2* 8.5* 9.4* 8.7* --  HCT 24.9* 23.6* 25.3* 26.5* 24.2* --  MCV 79.8 79.2 82.1 78.4 78.1 --  PLT 130* 126* 125* 130* 118* --   CBG:  Lab 12/20/11 1607 12/20/11 0722 12/20/11 0033 12/19/11 1551 12/19/11 0823  GLUCAP 132* 141* 142* 139* 142*   Lipid Profile  Basename 12/18/11 0455  CHOL 79  HDL --  LDLCALC --  TRIG 32  CHOLHDL --  LDLDIRECT --   Microbiology Recent Results (from the past 240 hour(s))  URINE CULTURE     Status: Normal   Collection Time   12/17/11  7:04 PM      Component Value Range Status Comment   Specimen Description URINE, CLEAN CATCH   Final    Special Requests Immunocompromised   Final    Culture  Setup Time 12/17/2011 23:02   Final    Colony Count NO GROWTH   Final    Culture NO GROWTH   Final    Report Status 12/18/2011 FINAL   Final   CULTURE, BLOOD (ROUTINE X 2)     Status: Normal (Preliminary result)   Collection Time   12/17/11  7:28 PM      Component Value Range Status  Comment   Specimen Description BLOOD LEFT ARM  10 ML IN [facility] BOTTLE   Final    Special Requests Immunocompromised   Final    Culture  Setup Time 12/18/2011 10:16   Final    Culture     Final    Value:        BLOOD CULTURE RECEIVED NO GROWTH TO DATE CULTURE WILL BE HELD FOR 5 DAYS BEFORE ISSUING A FINAL NEGATIVE REPORT   Report Status PENDING   Incomplete   CULTURE, BLOOD (ROUTINE X 2)     Status: Normal (Preliminary result)   Collection Time   12/17/11  7:35 PM      Component Value Range Status Comment   Specimen Description BLOOD LEFT ARM  10 ML IN [facility] BOTTLE   Final    Special Requests Immunocompromised   Final  Culture  Setup Time 12/18/2011 10:16   Final    Culture     Final    Value:        BLOOD CULTURE RECEIVED NO GROWTH TO DATE CULTURE WILL BE HELD FOR 5 DAYS BEFORE ISSUING A FINAL NEGATIVE REPORT   Report Status PENDING   Incomplete      Studies:  Dg Chest 2 View 12/12/2011 IMPRESSION: Decreased size of the right perihilar mass with question of necrosis /cavitation.  Otherwise, no acute change identified.   Original Report Authenticated By: Waneta Martins, M.D.     Dg Chest Port 1 View 12/17/2011  IMPRESSION: No acute disease in the chest.   Original Report Authenticated By: Gwynn Burly, M.D.     Scheduled Meds:    . fluconazole (DIFLUCAN) IV  200 mg Intravenous Q24H  . insulin aspart  0-15 Units Subcutaneous Q8H  . levofloxacin (LEVAQUIN) IV  750 mg Intravenous Q24H  . pantoprazole (PROTONIX) IV  40 mg Intravenous Q24H  . phosphorus  250 mg Oral Daily  . sodium chloride  500 mL Intravenous Once  . sodium chloride  10-40 mL Intracatheter Q12H  . DISCONTD: lidocaine  20 mL Mouth/Throat TID AC   Continuous Infusions:    . sodium chloride 50 mL/hr at 12/18/11 1940  . TPN (CLINIMIX) +/- additives 80 mL/hr at 12/18/11 1810   And  . fat emulsion 250 mL (12/18/11 1810)  . TPN (CLINIMIX) +/- additives     And  . fat emulsion    . TPN (CLINIMIX) +/-  additives 80 mL/hr at 12/19/11 1737  . DISCONTD: TPN (CLINIMIX) +/- additives      Time spent: 25 minutes.   LOS: 8 days   RAMA,CHRISTINA  [facility] Pager [phone].  If 8PM-8AM, please contact night-coverage at [email or web address], password [facility] 12/20/2011, 4:43 PM

## 2011-12-20 NOTE — Progress Notes (Signed)
Physical Therapy Treatment Patient Details Name: Justin Pittman MRN: [id number] DOB: [birth year] Today's Date: 12/20/2011 Time: 4098-1191 PT Time Calculation (min): 18 min  PT Assessment / Plan / Recommendation Comments on Treatment Session  pt needs encouragement to increae ambulation, He does better with RW and would benefit from continued strengthing for short term SNF to acheive functional independence at home since his wife cannot offer physical assist    Follow Up Recommendations  Post acute inpatient     Does the patient have the potential to tolerate intense rehabilitation  No, Recommend SNF  Barriers to Discharge        Equipment Recommendations  Rolling walker with 5" wheels;3 in 1 bedside comode    Recommendations for Other Services    Frequency Min 3X/week   Plan Discharge plan needs to be updated    Precautions / Restrictions Precautions Precautions: Fall Restrictions Weight Bearing Restrictions: No   Pertinent Vitals/Pain No c/o pain this pm    Mobility  Bed Mobility Supine to Sit: 6: Modified independent (Device/Increase time);With rails;HOB elevated Details for Bed Mobility Assistance: pt moves slowly and needs encourgement, but he is able to perform task Transfers Transfers: Sit to Stand;Stand to Sit Sit to Stand: From bed;With upper extremity assist;5: Supervision Stand to Sit: 6: Modified independent (Device/Increase time);To chair/3-in-1;With armrests;With upper extremity assist Details for Transfer Assistance: Repearted sit to stand x 5 repetitions with cues for hand placement Ambulation/Gait Ambulation Distance (Feet): 125 Feet Assistive device: Other (Comment);Rolling walker (IV pole ) Gait Pattern: Within Functional Limits Gait velocity: decreased General Gait Details: Pt able to walk further with RW Stairs: No Wheelchair Mobility Wheelchair Mobility: No    Exercises Other Exercises Other Exercises: standing isometrics to glutes Other  Exercises: standing tolerance lifting one arm at a time, then both arms to challenge balance   PT Diagnosis:    PT Problem List:   PT Treatment Interventions:     PT Goals Acute Rehab PT Goals PT Goal Formulation: With patient Time For Goal Achievement: 12/28/11 Potential to Achieve Goals: Good Pt will go Supine/Side to Sit: with supervision PT Goal: Supine/Side to Sit - Progress: Met Pt will go Sit to Stand: with supervision PT Goal: Sit to Stand - Progress: Met Pt will go Stand to Sit: with supervision PT Goal: Stand to Sit - Progress: Met Pt will Ambulate: with rolling walker;>150 feet;with supervision PT Goal: Ambulate - Progress: Progressing toward goal  Visit Information  Last PT Received On: 12/20/11 Assistance Needed: +1    Subjective Data  Subjective: I might need to go to rehab Patient Stated Goal: to get strong enough to go home   Cognition  Overall Cognitive Status: Appears within functional limits for tasks assessed/performed Arousal/Alertness: Awake/alert Orientation Level: Appears intact for tasks assessed Behavior During Session: [facility] for tasks performed    Balance     End of Session PT - End of Session Activity Tolerance: Patient tolerated treatment well Patient left: in bed   GP     Rosey Bath K. Manson Passey, [place] [phone] 12/20/2011, 4:04 PM

## 2011-12-20 NOTE — Progress Notes (Signed)
As noted in the case management focus note entry form 12/19/2011, an informal referral was made to [facility]. I am waiting for a phone call from the clinical liaison as soon as she is out of her marketing meeting. Hopefully the patient will be evaluated this morning.

## 2011-12-20 NOTE — Progress Notes (Signed)
PARENTERAL NUTRITION CONSULT NOTE - FOLLOW UP  Pharmacy Consult for TNA  Indication: Poor oral intake 2/2 radiation esophagitis  No Known Allergies  Patient Measurements: Height: 5\' 10"  (177.8 cm) Weight: 135 lb 12.8 oz (61.598 kg) IBW/kg (Calculated) : 73  Usual Weight: 71 kg   Vital Signs: Temp: 97.9 F (36.6 C) (10/23 0520) Temp src: Oral (10/23 0520) BP: 132/52 mmHg (10/23 0520) Pulse Rate: 100  (10/23 0520) Intake/Output from previous day: 10/22 0701 - 10/23 0700 In: 1493 [P.O.:60; I.V.:405; IV Piggyback:300; TPN:728] Out: 3500 [Urine:3500] Intake/Output from this shift:    Labs:  [facility] 12/19/11 0535 12/18/11 0455  WBC 4.0 3.8*  HGB 8.5* 8.2*  HCT 24.9* 23.6*  PLT 130* 126*  APTT -- --  INR -- --     Basename 12/19/11 0535 12/18/11 0455  NA 135 130*  K 4.3 3.7  CL 100 97  CO2 27 26  GLUCOSE 127* 106*  BUN 18 20  CREATININE 0.90 0.93  LABCREA -- --  CREAT24HRUR -- --  CALCIUM 7.2* 8.2*  MG -- 1.8  PHOS -- 4.5  PROT -- 4.9*  ALBUMIN -- 2.3*  AST -- 13  ALT -- 8  ALKPHOS -- 71  BILITOT -- 0.5  BILIDIR -- --  IBILI -- --  PREALBUMIN -- 5.6*  TRIG -- 32  CHOLHDL -- --  CHOL -- 79   Estimated Creatinine Clearance: 59.9 ml/min (by C-G formula based on Cr of 0.9).    Basename 12/20/11 0722 12/20/11 0033 12/19/11 1551  GLUCAP 141* 142* 139*    Insulin Requirements in the past 24 hours:  CBGs < 150, required 6 units moderate SSI Q8h  Nutritional Goals RD rec 10/16: Kcal:1950-2300, Protein: 85-100g, Fluid:1.9-2.3L Clinimix E5/20 at a goal rate of 80 ml/hr with lipids MWF will provide 96 g protein daily and 1689 kcal TTSS, 2169 kcal MWF, and an average of 1790 kcal/day.   Current Nutrition Clinimix E5/20 @ 80 ml/hr Lipids 20% @ 35ml/hr on MWF Regular diet starting 10/15 but poor appetite IVF NS @ 50 ml/hr  Asssessment: 79 yom with h/o stage IIIB NSCLC s/p concurrent chemoradiation for 6 weeks (carboplatin/paclitaxel) with last doses  given 10/813. Patient presented 12/12/2011 for intractable nausea and vomiting associated with dysphagia and thrush secondary to radiation induced esophagitis. TNA started 10/16 for poor oral intake and protein calorie malnutrition. Chemo and radiation currently on hold until improvement in condition. Spouse stated no further Rad/Onc for patient.  10/23: regular diet oral but oral intake is poor, pt states oral Lidocaine of no use and refusing. Outpt 3.5L/ 24 hr. TNA does not appear to blunt appetite.  -Note low BP 10/21 associated with temp spike, fluid bolus given. Also c/o urinary retention, foley since 10/21.  Labs Renal function: Scr stable/wnl Hepatic function: all wnl Electrolytes: Na+ now WNL, previously low, corrected Calc wnl (8.5), Mag = 1.8, Phos 4.5. (Unable to adjust lytes in pre-mixed TNA) Pre-Albumin: 10.8 (10/17), 5.6 (10/21) TG/Cholesterol: wnl 10/17, 10/21  Plan:  At 1800 tonight  Continue Clinimix E5/20 at goal of 80 ml/hr  TNA to contain IV fat emulsion, standard multivitamins and trace elements only on MWF only due to ongoing shortage  Continue SSI q8h  TNA labs Monday/Thursdays  Pharmacy will follow up daily  IV NS at 50 ml/hr this am, monitor BP  Francisca Harbuck L  Pharm.D. Pager # [phone] 12/20/2011 9:56 AM

## 2011-12-21 LAB — COMPREHENSIVE METABOLIC PANEL
ALT: 14 U/L (ref 0–53)
Alkaline Phosphatase: 84 U/L (ref 39–117)
BUN: 20 mg/dL (ref 6–23)
CO2: 27 mEq/L (ref 19–32)
GFR calc Af Amer: >90 mL/min (ref >90)
GFR calc non Af Amer: 81 mL/min — ABNORMAL LOW (ref >90)
Glucose, Bld: 128 mg/dL — ABNORMAL HIGH (ref 70–99)
Potassium: 4.1 mEq/L (ref 3.5–5.1)
Total Protein: 5.5 g/dL — ABNORMAL LOW (ref 6.0–8.3)

## 2011-12-21 LAB — MAGNESIUM: Magnesium: 1.4 mg/dL — ABNORMAL LOW (ref 1.5–2.5)

## 2011-12-21 LAB — GLUCOSE, CAPILLARY
Glucose-Capillary: 127 mg/dL — ABNORMAL HIGH (ref 70–99)
Glucose-Capillary: 138 mg/dL — ABNORMAL HIGH (ref 70–99)

## 2011-12-21 MED ORDER — CLINIMIX E/DEXTROSE (5/20) 5 % IV SOLN
INTRAVENOUS | Status: AC
  Administered 2011-12-21: 19:00:00 via INTRAVENOUS
  Filled 2011-12-21: qty 2000

## 2011-12-21 MED ORDER — MAGNESIUM SULFATE 40 MG/ML IJ SOLN
4.0000 g | Freq: Once | INTRAMUSCULAR | Status: AC
  Administered 2011-12-21: 4 g via INTRAVENOUS
  Filled 2011-12-21 (×2): qty 100

## 2011-12-21 NOTE — Progress Notes (Signed)
Clinical Social Worker met with pt and pt son, Justin Pittman at bedside to provide SNF bed offers. Clinical Social Worker provided offers and clarified pt sons questions. Pt son interested in pt information being sent to [facility] as well. Pt son plans to discuss with pt spouse and stated that pt spouse will be at bedside tomorrow to meet with medical team in order to develop discharge plan. Clinical Social Worker initiation SNF search to [facility] and will notify pt family of updated bed offers. Clinical Social Worker to continue to follow and assist with pt discharge planning needs.   Jacklynn Lewis, MSW, LCSWA  Clinical Social Work [phone]

## 2011-12-21 NOTE — Progress Notes (Signed)
PARENTERAL NUTRITION CONSULT NOTE - FOLLOW UP  Pharmacy Consult for TNA  Indication: Poor oral intake 2/2 radiation esophagitis  No Known Allergies  Patient Measurements: Height: 5\' 10"  (177.8 cm) Weight: 135 lb 12.8 oz (61.598 kg) IBW/kg (Calculated) : 73  Usual Weight: 71 kg   Vital Signs: Temp: 98.4 F (36.9 C) (10/24 0552) Temp src: Oral (10/24 0552) BP: 105/63 mmHg (10/24 0552) Pulse Rate: 100  (10/24 0552) Intake/Output from previous day: 10/23 0701 - 10/24 0700 In: 3691.3 [P.O.:480; I.V.:1196.7; IV Piggyback:100; TPN:1914.7] Out: 3725 [Urine:3725] Intake/Output from this shift: Total I/O In: -  Out: 2125 [Urine:2125]  Labs:  [facility] 12/19/11 0535  WBC 4.0  HGB 8.5*  HCT 24.9*  PLT 130*  APTT --  INR --     Basename 12/19/11 0535  NA 135  K 4.3  CL 100  CO2 27  GLUCOSE 127*  BUN 18  CREATININE 0.90  LABCREA --  CREAT24HRUR --  CALCIUM 7.2*  MG --  PHOS --  PROT --  ALBUMIN --  AST --  ALT --  ALKPHOS --  BILITOT --  BILIDIR --  IBILI --  PREALBUMIN --  TRIG --  CHOLHDL --  CHOL --   Estimated Creatinine Clearance: 59.9 ml/min (by C-G formula based on Cr of 0.9).    Basename 12/21/11 0010 12/20/11 1607 12/20/11 0722  GLUCAP 126* 132* 141*    Insulin Requirements in the past 24 hours:  CBGs < 150, required 6 units moderate SSI Q8h  Nutritional Goals RD rec 10/16: Kcal:1950-2300, Protein: 85-100g, Fluid:1.9-2.3L Clinimix E5/20 at a goal rate of 80 ml/hr with lipids MWF will provide 96 g protein daily and 1689 kcal TTSS, 2169 kcal MWF, and an average of 1790 kcal/day.   Current Nutrition Clinimix E5/20 @ 80 ml/hr Lipids 20% @ 59ml/hr on MWF Regular diet starting 10/15 but poor appetite, 10/23 - 75% of meals eaten recorded on flowsheet - but still poor oral intake IVF NS @ 50 ml/hr  Asssessment: 79 yom with h/o stage IIIB NSCLC s/p concurrent chemoradiation for 6 weeks (carboplatin/paclitaxel) with last doses given 10/813.  Patient presented 12/12/2011 for intractable nausea and vomiting associated with dysphagia and thrush secondary to radiation induced esophagitis. TNA started 10/16 for poor oral intake and protein calorie malnutrition. Chemo and radiation currently on hold until improvement in condition. Spouse stated no further Rad/Onc for patient.  -Note low BP 10/21 associated with temp spike, fluid bolus given. Also c/o urinary retention, foley since 10/21.  10/23: regular diet oral but oral intake is poor, pt states oral Lidocaine of no use and refusing. Outpt 3.5L/ 24 hr. TNA does not appear to blunt appetite. 10/24:  per MD patient needs ongoing nutrition with TNA  Labs Renal function: Scr stable/wnl Hepatic function: all wnl Electrolytes: Na+ low (cannot be adjusted in TNA),  Mag = 1.4 (Low), Phos 3.6 (WNL). (Unable to adjust lytes in pre-mixed TNA) Pre-Albumin: 10.8 (10/17), 5.6 (10/21) TG/Cholesterol: wnl 10/17, 10/21  Plan:  At 1800 tonight  Continue Clinimix E5/20 at goal of 80 ml/hr  TNA to contain IV fat emulsion, standard multivitamins and trace elements only on MWF only due to ongoing shortage  Magnesium low - replaced with 4 g IV per MD  Continue SSI q8h  TNA labs Monday/Thursdays  Pharmacy will follow up daily  IV NS at 50 ml/hr this am, monitor BP  Tammy Sours, Pharm.D. Clinical Oncology Pharmacist  Pager # [phone]  12/21/2011 6:42 AM

## 2011-12-21 NOTE — Progress Notes (Signed)
[facility] PROGRESS NOTE  Justin Pittman ZOX:[id number] DOB: Feb 16, 1933 DOA: 12/12/2011 PCP: [name], MD  Brief narrative: Justin Pittman is a 78 year old male with a PMH of stage IIIB non-small cell lung cancer, squamous cell carcinoma with right lower lobe lung mass as well as left hilar lymphadenopathy (diagnosed in August of 2013) receiving chemoradiation with weekly carboplatin and paclitaxel, status post 6 weekly doses of treatment last dose was given on 12/05/2011. Patient presented 12/12/2011 with intractable nausea and vomiting associated with pain and difficulty with swallowing which was determined to be secondary to radiation esophagitis and oral thrush. Patient is receiving TNA for temporary nutritional support. Hospital course complicated by the development of possible aspiration pneumonia for which reason patient was started on empiric vanco and zosyn, which has now been narrowed to Levaquin. The barrier to discharge is ongoing TNA dependence and poor oral intake.    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  Refusing viscous lidocaine because of nausea.  Still with poor by mouth intake.  May need skilled nursing home placement for ongoing nutritional support. Active Problems:  Aspiration pneumonia   Seen by speech therapist on 12/18/2011: recommended dysphagia 3 diet, thin liquids.  Continue to encourage po intake.   Initially treated with vanco and zosyn, but narrowed to Levaquin based on negative culture data. Non-small cell lung cancer   Seen by Dr. Arbutus Ped on 12/18/2011.   Further radiation and chemotherapy on hold; please note that the family does not want to proceed with further radiation treatments until the patient's symptoms are resolved.  Dr. Arbutus Ped favors no further  XRT. Pancytopenia / Anemia of chronic disease   Likely secondary to sequela of chemotherapy.  White blood cell count is normal, hemoglobin is stable, platelet count is stable. The patient did receive one unit of packed red blood cells on 12/15/2011 Hypokalemia/ Hypomagnesemia   Monitor electrolytes closely and replenish when indicated. Protein calorie malnutrition   Secondary to malignancy and poor oral intake due to radiation esophagitis.  TNA initiated on 12/13/2011 with ongoing problems with poor oral intake.  Continue dysphagia 3 diet as tolerated. Hypertension   Blood pressure currently controlled.   Code Status: Full Family Communication: Declined my offer to call family. Disposition Plan: SNF placement.   Medical Consultants:  Dr. Si Gaul, Oncology  Billie Ruddy, PA, physical medicine and rehabilitation  Other Consultants:  Physical therapy: Recommend SNF.  Dietitian: Ongoing inadequate oral intake.  Pharmacy: T. and A. and antibiotic dosing.  Procedures:  PICC placement 12/13/2011.  Antibiotics:  Levaquin 12/19/2011--->  Diflucan 12/13/2011--->  Vancomycin 12/17/2011---> 12/19/2011  Zosyn 12/17/2011---> 12/19/2011   HPI/Subjective: Justin Pittman is still having a fair amount of chest discomfort whenever he swallows.  Appetite and ability to swallow still poor.  Has a dry cough.  Some nausea, but no vomiting.    Objective: Filed Vitals:   12/19/11 2104 12/20/11 0520 12/20/11 1355 12/21/11 0552  BP: 131/64 132/52 117/65 105/63  Pulse: 93 100 93 100  Temp: 97.9 F (36.6 C) 97.9 F (36.6 C) 98.1 F (36.7 C) 98.4 F (36.9 C)  TempSrc: Oral Oral Oral Oral  Resp: 16 16 18 16   Height:      Weight:      SpO2: 98% 97% 99% 97%    Intake/Output Summary (Last 24 hours) at 12/21/11 [phone]  Last data filed at 12/21/11 0500  Gross per 24 hour  Intake   2034 ml  Output   3725 ml  Net  -1691 ml    Exam: Gen:  NAD Cardiovascular:  RRR, No  M/R/G Respiratory: Lungs CTAB Gastrointestinal: Abdomen soft, NT/ND with normal active bowel sounds. Extremities: No C/E/C  Data Reviewed: Basic Metabolic Panel:  Lab 12/21/11 1610 12/19/11 0535 12/18/11 0455 12/17/11 0555 12/16/11 0531 12/15/11 0650  NA 131* 135 130* 129* 131* --  K 4.1 4.3 -- -- -- --  CL 97 100 97 95* 96 --  CO2 27 27 26 26 28  --  GLUCOSE 128* 127* 106* 107* 116* --  BUN 20 18 20 15 12  --  CREATININE 0.88 0.90 0.93 0.79 0.69 --  CALCIUM 8.5 7.2* 8.2* 8.3* 8.1* --  MG 1.4* -- 1.8 1.5 -- 1.4*  PHOS 3.6 -- 4.5 -- -- 2.3   GFR Estimated Creatinine Clearance: 61.3 ml/min (by C-G formula based on Cr of 0.88). Liver Function Tests:  Lab 12/21/11 0610 12/18/11 0455 12/15/11 0650  AST 19 13 12   ALT 14 8 7   ALKPHOS 84 71 77  BILITOT 0.2* 0.5 0.6  PROT 5.5* 4.9* 5.0*  ALBUMIN 2.3* 2.3* 2.5*    CBC:  Lab 12/19/11 0535 12/18/11 0455 12/17/11 0400 12/16/11 0531 12/15/11 0650  WBC 4.0 3.8* 2.2* 2.7* 2.4*  NEUTROABS -- 2.6 -- -- --  HGB 8.5* 8.2* 8.5* 9.4* 8.7*  HCT 24.9* 23.6* 25.3* 26.5* 24.2*  MCV 79.8 79.2 82.1 78.4 78.1  PLT 130* 126* 125* 130* 118*   CBG:  Lab 12/21/11 0817 12/21/11 0010 12/20/11 1607 12/20/11 0722 12/20/11 0033  GLUCAP 127* 126* 132* 141* 142*   Lipid Profile No results found for this basename: CHOL:2,HDL:2,LDLCALC:2,TRIG:2,CHOLHDL:2,LDLDIRECT:2 in the last 72 hours Microbiology Recent Results (from the past 240 hour(s))  URINE CULTURE     Status: Normal   Collection Time   12/17/11  7:04 PM      Component Value Range Status Comment   Specimen Description URINE, CLEAN CATCH   Final    Special Requests Immunocompromised   Final    Culture  Setup Time 12/17/2011 23:02   Final    Colony Count NO GROWTH   Final    Culture NO GROWTH   Final    Report Status 12/18/2011 FINAL   Final   CULTURE, BLOOD (ROUTINE X 2)     Status: Normal (Preliminary result)   Collection Time   12/17/11  7:28 PM      Component Value Range Status Comment    Specimen Description BLOOD LEFT ARM  10 ML IN [facility] BOTTLE   Final    Special Requests Immunocompromised   Final    Culture  Setup Time 12/18/2011 10:16   Final    Culture     Final    Value:        BLOOD CULTURE RECEIVED NO GROWTH TO DATE CULTURE WILL BE HELD FOR 5 DAYS BEFORE ISSUING A FINAL NEGATIVE REPORT   Report Status PENDING   Incomplete   CULTURE, BLOOD (ROUTINE X 2)     Status: Normal (Preliminary result)   Collection Time   12/17/11  7:35 PM      Component Value Range Status Comment   Specimen Description BLOOD LEFT ARM  10 ML IN [facility] BOTTLE   Final    Special Requests Immunocompromised   Final    Culture  Setup Time 12/18/2011 10:16   Final  Culture     Final    Value:        BLOOD CULTURE RECEIVED NO GROWTH TO DATE CULTURE WILL BE HELD FOR 5 DAYS BEFORE ISSUING A FINAL NEGATIVE REPORT   Report Status PENDING   Incomplete      Studies:  Dg Chest 2 View 12/12/2011 IMPRESSION: Decreased size of the right perihilar mass with question of necrosis /cavitation.  Otherwise, no acute change identified.   Original Report Authenticated By: Waneta Martins, M.D.     Dg Chest Port 1 View 12/17/2011  IMPRESSION: No acute disease in the chest.   Original Report Authenticated By: Gwynn Burly, M.D.     Scheduled Meds:    . fluconazole (DIFLUCAN) IV  200 mg Intravenous Q24H  . insulin aspart  0-15 Units Subcutaneous Q8H  . levofloxacin (LEVAQUIN) IV  750 mg Intravenous Q24H  . pantoprazole (PROTONIX) IV  40 mg Intravenous Q24H  . phosphorus  250 mg Oral Daily  . sodium chloride  500 mL Intravenous Once  . sodium chloride  10-40 mL Intracatheter Q12H  . DISCONTD: lidocaine  20 mL Mouth/Throat TID AC   Continuous Infusions:    . sodium chloride 50 mL/hr at 12/20/11 1654  . TPN (CLINIMIX) +/- additives 80 mL/hr at 12/20/11 1813   And  . fat emulsion 250 mL (12/20/11 1813)  . TPN (CLINIMIX) +/- additives 80 mL/hr at 12/19/11 1737  . DISCONTD: TPN (CLINIMIX) +/-  additives      Time spent: 25 minutes.   LOS: 9 days   Nathasha Fiorillo  [facility] Pager [phone].  If 8PM-8AM, please contact night-coverage at [email or web address], password [facility] 12/21/2011, 8:42 AM

## 2011-12-21 NOTE — Progress Notes (Signed)
Speech Language Pathology Dysphagia Treatment Patient Details Name: Justin Pittman MRN: [id number] DOB: 24-Jun-1932 Today's Date: 12/21/2011 Time: 4098-1191 SLP Time Calculation (min): 13 min  Assessment / Plan / Recommendation Clinical Impression  Follow up to assure education completed with pt to aid in maximizing intake, swallow effiency and safety.  Pt reports mild improvement with odynophagia and he is not taking the lidocaine as it "comes back up" and doesn't help.  RN reports she will help pt order dinner.  Pt also reports decreased appetite, pointing to TPN bag saying "I get all I need from that."  No clinical s/s of aspiration observed with water swallows today and recent CXR was negative.  Educated pt to clinical signs of oropharyngeal dysphagia to monitor and to general aspiration precautions.  Encouraged him to continue po intake as tolerated.  Pt's spouse not present but she was educated during initial evaluation and received written documentation.   SLP to sign off as pt's dysphagia primarily due to his radiation esophagitis and oropharyngeal precautions reviewed.     Diet Recommendation  Continue with Current Diet: Dysphagia 3 (mechanical soft);Thin liquid    SLP Plan All goals met   Pertinent Vitals/Pain Afebrile, on room air, poor intake continues   Swallowing Goals  SLP Swallowing Goals Swallow Study Goal #2 - Progress: Met  General Temperature Spikes Noted: No Respiratory Status: Room air Behavior/Cognition: Alert;Cooperative;Pleasant mood Oral Cavity - Dentition: Missing dentition Patient Positioning: Upright in bed  Oral Cavity - Oral Hygiene   Pt c/o xerostomia, abated by consuming water.   Dysphagia Treatment Treatment focused on: Patient/family/caregiver education Family/Caregiver Educated: pt Treatment Methods/Modalities: Skilled observation Patient observed directly with PO's: Yes Type of PO's observed: Thin liquids Feeding: Able to feed self Liquids  provided via: Cup Pharyngeal Phase Signs & Symptoms:  (pt taking small sips independently, likely d/t odynophagia)   GO    Donavan Burnet, MS [facility] SLP [phone]

## 2011-12-22 LAB — GLUCOSE, CAPILLARY: Glucose-Capillary: 144 mg/dL — ABNORMAL HIGH (ref 70–99)

## 2011-12-22 MED ORDER — POLYETHYLENE GLYCOL 3350 17 G PO PACK
17.0000 g | PACK | Freq: Every day | ORAL | Status: DC
  Administered 2011-12-22 – 2011-12-28 (×5): 17 g via ORAL
  Filled 2011-12-22 (×7): qty 1

## 2011-12-22 MED ORDER — VITAMINS A & D EX OINT
TOPICAL_OINTMENT | CUTANEOUS | Status: AC
  Filled 2011-12-22: qty 5

## 2011-12-22 MED ORDER — MORPHINE SULFATE (CONCENTRATE) 20 MG/ML PO SOLN
10.0000 mg | ORAL | Status: DC | PRN
  Administered 2011-12-22 – 2011-12-28 (×6): 10 mg via ORAL
  Filled 2011-12-22 (×6): qty 1

## 2011-12-22 MED ORDER — ZINC TRACE METAL 1 MG/ML IV SOLN
INTRAVENOUS | Status: AC
  Administered 2011-12-22: 18:00:00 via INTRAVENOUS
  Filled 2011-12-22: qty 2000

## 2011-12-22 MED ORDER — FAT EMULSION 20 % IV EMUL
250.0000 mL | INTRAVENOUS | Status: AC
  Administered 2011-12-22: 250 mL via INTRAVENOUS
  Filled 2011-12-22: qty 250

## 2011-12-22 NOTE — Progress Notes (Signed)
OT Cancellation Note  Patient Details Name: Justin Pittman MRN: [id number] DOB: [birth year]   Cancelled Treatment:    Reason Eval/Treat Not Completed: Other (comment) (Pt refused to participate)  Kristinia Leavy A, OTR/L [id number] 12/22/2011, 1:26 PM

## 2011-12-22 NOTE — Progress Notes (Addendum)
Clinical Social Worker followed up with pt in regard to SNF bed offers. Clinical Social Worker clarified with pt that after follow up with facilities the only two bed offers that pt has is [facility] and [facility]. Clinical Social Worker inquired with pt about what time pt wife would arrive and pt reports that he thought that she would have arrived to hospital by now. Pt provided permission for Clinical Social Worker to follow up with pt son and pt wife via telephone to discuss SNF placement. Clinical Social Worker contacted pt son and left voice message. Clinical Social Worker reached pt wife on home telephone. Pt wife stated that she had initially planned to come to hospital, but pt reported to her that he was "doing well" and per pt wife, pt was worried about pt wife coming to hospital in the cold. Clinical Social Worker discussed discharge planning process with pt wife and explained that it would be important to make decision about SNF placement in a timely manner in order for facility to get TNA. Pt wife stated that she was familiar with [facility], but not as familiar with [facility] and plans to tour both facilities today. Pt wife reports that pt son provided pt wife this this Clinical Social Worker contact information and pt wife reports that she will follow up with this Clinical Child psychotherapist following tour of facilities. Clinical Social Worker clarified pt wife questions. Clinical Social Worker to follow up with pt wife in regard to decision for SNF. Clinical Social Worker to facilitate pt discharge needs when pt medically stable for discharge.  Addendum 2:00pm: Clinical Child psychotherapist received return phone call from pt son, Dovid Bartko. Pt son confirmed that pt wife and pt son plan to tour [facility] and [facility] this afternoon/evening. Pt wife and pt son both inquired about discharge date  and Clinical Social Worker notified MD in order for MD to discuss with pt family. Clinical Social Worker to follow up with pt and pt family in regard to decision for SNF. Clinical Social Worker to facilitate pt discharge needs when pt medically ready for discharge.  Jacklynn Lewis, MSW, LCSWA  Clinical Social Work [phone]

## 2011-12-22 NOTE — Progress Notes (Addendum)
Clinical Social Work Department CLINICAL SOCIAL WORK PLACEMENT NOTE 12/22/2011  Patient:  GLENNIE, RODDA  Account Number:  [id number] Admit date:  12/12/2011  Clinical Social Worker:  Jacelyn Grip  Date/time:  12/21/2011 02:45 PM  Clinical Social Work is seeking post-discharge placement for this patient at the following level of care:   SKILLED NURSING   (*CSW will update this form in Epic as items are completed)   12/20/2011  Patient/family provided with [facility] Department of Clinical Social Work's list of facilities offering this level of care within the geographic area requested by the patient (or if unable, by the patient's family).  12/20/2011  Patient/family informed of their freedom to choose among providers that offer the needed level of care, that participate in Medicare, Medicaid or managed care program needed by the patient, have an available bed and are willing to accept the patient.  12/20/2011  Patient/family informed of [facility]' ownership interest in [facility], as well as of the fact that they are under no obligation to receive care at this facility.  PASARR submitted to EDS on 12/20/2011 PASARR number received from EDS on 12/20/2011  FL2 transmitted to all facilities in geographic area requested by pt/family on  12/20/2011; re-initiated 12/25/2011 FL2 transmitted to all facilities within larger geographic area on   Patient informed that his/her managed care company has contracts with or will negotiate with  certain facilities, including the following:     Patient/family informed of bed offers received:  12/21/2011 Patient chooses bed at [facility] Physician recommends and patient chooses bed at    Patient to be transferred to  on  12/28/2011 Patient to be transferred to facility by ambulance [facility])  The following physician request were entered in Epic:   Additional Comments: Plan is for pt to now received PEG tube placement; CSW  re-initiated SNF search with changes as family is hopeful for better SNF options.

## 2011-12-22 NOTE — Progress Notes (Addendum)
[facility] PROGRESS NOTE  Derral Augsburger ZOX:[id number] DOB: Mar 24, 1932 DOA: 12/12/2011 PCP: [name], MD  Brief narrative: Mr. Auringer is a 78 year old male with a PMH of stage IIIB non-small cell lung cancer, squamous cell carcinoma with right lower lobe lung mass as well as left hilar lymphadenopathy (diagnosed in August of 2013) receiving chemoradiation with weekly carboplatin and paclitaxel, status post 6 weekly doses of treatment last dose was given on 12/05/2011. Patient presented 12/12/2011 with intractable nausea and vomiting associated with pain and difficulty with swallowing which was determined to be secondary to radiation esophagitis and oral thrush. Patient is receiving TNA for temporary nutritional support. Hospital course complicated by the development of possible aspiration pneumonia for which reason patient was started on empiric vanco and zosyn, which has now been narrowed to Levaquin. The barrier to discharge is ongoing TNA dependence and poor oral intake.    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  Refusing viscous lidocaine because of nausea.  Still with poor by mouth intake.  May need skilled nursing home placement for ongoing nutritional support. Active Problems:  Aspiration pneumonia   Seen by speech therapist on 12/18/2011: recommended dysphagia 3 diet, thin liquids.  Continue to encourage po intake.   Initially treated with vanco and zosyn, but narrowed to Levaquin based on negative culture data. Non-small cell lung cancer   Seen by Dr. Arbutus Ped on 12/18/2011.   Further radiation and chemotherapy on hold; please note that the family does not want to proceed with further radiation treatments until the patient's symptoms are resolved.  Dr. Arbutus Ped favors no further  XRT. Pancytopenia / Anemia of chronic disease   Likely secondary to sequela of chemotherapy.  White blood cell count is normal, hemoglobin is stable, platelet count is stable. The patient did receive one unit of packed red blood cells on 12/15/2011 Hypokalemia/ Hypomagnesemia   Monitor electrolytes closely and replenish when indicated. Protein calorie malnutrition   Secondary to malignancy and poor oral intake due to radiation esophagitis.  TNA initiated on 12/13/2011 with ongoing problems with poor oral intake.  Continue dysphagia 3 diet as tolerated. Hypertension   Blood pressure currently controlled.   Code Status: Full Family Communication: With permission, called and updated patient's wife.  Spoke with son at bedside 12/21/11. Disposition Plan: SNF placement.   Medical Consultants:  Dr. Si Gaul, Oncology  Billie Ruddy, PA, physical medicine and rehabilitation  Other Consultants:  Physical therapy: Recommend SNF.  Dietitian: Ongoing inadequate oral intake.  Pharmacy: T. and A. and antibiotic dosing.  Procedures:  PICC placement 12/13/2011.  Antibiotics:  Levaquin 12/19/2011--->  Diflucan 12/13/2011--->  Vancomycin 12/17/2011---> 12/19/2011  Zosyn 12/17/2011---> 12/19/2011   HPI/Subjective: Mr. Sebring is still having a fair amount of chest discomfort whenever he swallows.  Appetite and ability to swallow still poor.  Has a dry cough.  Some nausea, but no vomiting.  Wants me to order a stool softener, although he did say his bowels moved today.  Objective: Filed Vitals:   12/21/11 0552 12/21/11 1326 12/21/11 2115 12/22/11 0530  BP: 105/63 123/65 113/65 98/60  Pulse: 100 93 106 106  Temp: 98.4 F (36.9 C) 97.7 F (36.5 C) 98.6 F (37 C) 98.4 F (36.9 C)  TempSrc: Oral Oral Oral Oral  Resp: 16 18 18 18   Height:  Weight:      SpO2: 97% 99% 99% 99%    Intake/Output Summary (Last 24 hours) at 12/22/11 0854 Last data filed at  12/21/11 2100  Gross per 24 hour  Intake    201 ml  Output   1025 ml  Net   -824 ml    Exam: Gen:  NAD Cardiovascular:  RRR, No M/R/G Respiratory: Lungs CTAB Gastrointestinal: Abdomen soft, NT/ND with normal active bowel sounds. Extremities: No C/E/C  Data Reviewed: Basic Metabolic Panel:  Lab 12/21/11 9604 12/19/11 0535 12/18/11 0455 12/17/11 0555 12/16/11 0531  NA 131* 135 130* 129* 131*  K 4.1 4.3 -- -- --  CL 97 100 97 95* 96  CO2 27 27 26 26 28   GLUCOSE 128* 127* 106* 107* 116*  BUN 20 18 20 15 12   CREATININE 0.88 0.90 0.93 0.79 0.69  CALCIUM 8.5 7.2* 8.2* 8.3* 8.1*  MG 1.4* -- 1.8 1.5 --  PHOS 3.6 -- 4.5 -- --   GFR Estimated Creatinine Clearance: 61.3 ml/min (by C-G formula based on Cr of 0.88). Liver Function Tests:  Lab 12/21/11 0610 12/18/11 0455  AST 19 13  ALT 14 8  ALKPHOS 84 71  BILITOT 0.2* 0.5  PROT 5.5* 4.9*  ALBUMIN 2.3* 2.3*    CBC:  Lab 12/19/11 0535 12/18/11 0455 12/17/11 0400 12/16/11 0531  WBC 4.0 3.8* 2.2* 2.7*  NEUTROABS -- 2.6 -- --  HGB 8.5* 8.2* 8.5* 9.4*  HCT 24.9* 23.6* 25.3* 26.5*  MCV 79.8 79.2 82.1 78.4  PLT 130* 126* 125* 130*   CBG:  Lab 12/22/11 0752 12/21/11 2353 12/21/11 1655 12/21/11 0817 12/21/11 0010  GLUCAP 153* 126* 138* 127* 126*   Lipid Profile No results found for this basename: CHOL:2,HDL:2,LDLCALC:2,TRIG:2,CHOLHDL:2,LDLDIRECT:2 in the last 72 hours Microbiology Recent Results (from the past 240 hour(s))  URINE CULTURE     Status: Normal   Collection Time   12/17/11  7:04 PM      Component Value Range Status Comment   Specimen Description URINE, CLEAN CATCH   Final    Special Requests Immunocompromised   Final    Culture  Setup Time 12/17/2011 23:02   Final    Colony Count NO GROWTH   Final    Culture NO GROWTH   Final    Report Status 12/18/2011 FINAL   Final   CULTURE, BLOOD (ROUTINE X 2)     Status: Normal (Preliminary result)   Collection Time   12/17/11  7:28 PM      Component Value Range  Status Comment   Specimen Description BLOOD LEFT ARM  10 ML IN [facility] BOTTLE   Final    Special Requests Immunocompromised   Final    Culture  Setup Time 12/18/2011 10:16   Final    Culture     Final    Value:        BLOOD CULTURE RECEIVED NO GROWTH TO DATE CULTURE WILL BE HELD FOR 5 DAYS BEFORE ISSUING A FINAL NEGATIVE REPORT   Report Status PENDING   Incomplete   CULTURE, BLOOD (ROUTINE X 2)     Status: Normal (Preliminary result)   Collection Time   12/17/11  7:35 PM      Component Value Range Status Comment   Specimen Description BLOOD LEFT ARM  10 ML IN [facility] BOTTLE   Final    Special Requests Immunocompromised   Final    Culture  Setup Time 12/18/2011 10:16   Final    Culture  Final    Value:        BLOOD CULTURE RECEIVED NO GROWTH TO DATE CULTURE WILL BE HELD FOR 5 DAYS BEFORE ISSUING A FINAL NEGATIVE REPORT   Report Status PENDING   Incomplete      Studies:  Dg Chest 2 View 12/12/2011 IMPRESSION: Decreased size of the right perihilar mass with question of necrosis /cavitation.  Otherwise, no acute change identified.   Original Report Authenticated By: Waneta Martins, M.D.     Dg Chest Port 1 View 12/17/2011  IMPRESSION: No acute disease in the chest.   Original Report Authenticated By: Gwynn Burly, M.D.     Scheduled Meds:    . fluconazole (DIFLUCAN) IV  200 mg Intravenous Q24H  . insulin aspart  0-15 Units Subcutaneous Q8H  . levofloxacin (LEVAQUIN) IV  750 mg Intravenous Q24H  . magnesium sulfate 1 - 4 g bolus IVPB  4 g Intravenous Once  . pantoprazole (PROTONIX) IV  40 mg Intravenous Q24H  . phosphorus  250 mg Oral Daily  . sodium chloride  500 mL Intravenous Once  . sodium chloride  10-40 mL Intracatheter Q12H   Continuous Infusions:    . sodium chloride 20 mL (12/22/11 0352)  . TPN (CLINIMIX) +/- additives 80 mL/hr at 12/20/11 1813   And  . fat emulsion 250 mL (12/20/11 1813)  . TPN (CLINIMIX) +/- additives     And  . fat emulsion    . TPN  (CLINIMIX) +/- additives 80 mL/hr at 12/21/11 1849    Time spent: 25 minutes.   LOS: 10 days   RAMA,CHRISTINA  [facility] Pager [phone].  If 8PM-8AM, please contact night-coverage at [email or web address], password [facility] 12/22/2011, 8:54 AM

## 2011-12-22 NOTE — Progress Notes (Signed)
ANTIBIOTIC CONSULT NOTE  Pharmacy Consult for Levaquin Indication: rule out pneumonia  No Known Allergies  Patient Measurements: Height: 5\' 10"  (177.8 cm) Weight: 135 lb 12.8 oz (61.598 kg) IBW/kg (Calculated) : 73   Vital Signs: Temp: 98.4 F (36.9 C) (10/25 0530) Temp src: Oral (10/25 0530) BP: 98/60 mmHg (10/25 0530) Pulse Rate: 106  (10/25 0530) Intake/Output from previous day: 10/24 0701 - 10/25 0700 In: 396 [P.O.:120; I.V.:136; IV Piggyback:100] Out: 1025 [Urine:1025] Intake/Output from this shift:    Labs:  [facility] 12/21/11 0610  WBC --  HGB --  PLT --  LABCREA --  CREATININE 0.88   Estimated Creatinine Clearance: 61.3 ml/min (by C-G formula based on Cr of 0.88).   Microbiology: Recent Results (from the past 720 hour(s))  URINE CULTURE     Status: Normal   Collection Time   12/17/11  7:04 PM      Component Value Range Status Comment   Specimen Description URINE, CLEAN CATCH   Final    Special Requests Immunocompromised   Final    Culture  Setup Time 12/17/2011 23:02   Final    Colony Count NO GROWTH   Final    Culture NO GROWTH   Final    Report Status 12/18/2011 FINAL   Final   CULTURE, BLOOD (ROUTINE X 2)     Status: Normal (Preliminary result)   Collection Time   12/17/11  7:28 PM      Component Value Range Status Comment   Specimen Description BLOOD LEFT ARM  10 ML IN [facility] BOTTLE   Final    Special Requests Immunocompromised   Final    Culture  Setup Time 12/18/2011 10:16   Final    Culture     Final    Value:        BLOOD CULTURE RECEIVED NO GROWTH TO DATE CULTURE WILL BE HELD FOR 5 DAYS BEFORE ISSUING A FINAL NEGATIVE REPORT   Report Status PENDING   Incomplete   CULTURE, BLOOD (ROUTINE X 2)     Status: Normal (Preliminary result)   Collection Time   12/17/11  7:35 PM      Component Value Range Status Comment   Specimen Description BLOOD LEFT ARM  10 ML IN [facility] BOTTLE   Final    Special Requests Immunocompromised   Final    Culture  Setup  Time 12/18/2011 10:16   Final    Culture     Final    Value:        BLOOD CULTURE RECEIVED NO GROWTH TO DATE CULTURE WILL BE HELD FOR 5 DAYS BEFORE ISSUING A FINAL NEGATIVE REPORT   Report Status PENDING   Incomplete    Assessment:  58 yoM with h/o NSCLC s/p chemoradiation known to pharmacy for current TNA therapy protocol due to dysphagia and odynophagia 2/2 radiation induced esophagitis.    Vancomycin and Zosyn started 10/20 and d/ced 10/22, narrowed to Levaquin due to negative culture data  Day 4 Levaquin 750 mg IV q 24 hours (Day 6 total of abx)  Also day #10 fluconazole for esophagitis  CrCl >50 ml/min, therefore standard dosing for pneumonia is appropriate  Goal of Therapy:  Eradication of infection  Plan:   Continue Levaquin 750mg  IV q24h  F/u cultures, T, renal fxn, and duration of therapy  Romuald Mccaslin, Pharm.D. Clinical Oncology Pharmacist  Pager # [phone]  12/22/2011,7:48 AM

## 2011-12-22 NOTE — Progress Notes (Signed)
Clinical Social Worker continuing to assist with pt discharge planning needs. Clinical Social Worker received another phone call from pt son inquiring about other facilities and if they have been able to offer a bed. Clinical Social Worker has made it very clear to pt spouse and pt son that [facility] and [facility] are only facilities that have offered a bed and can manage pt on TNA at discharge. Pt son stated pt family plans to tour facilities today and through weekend. Pt son interested in more information about TNA at home and Clinical Social Worker notified RNCM to contact pt son to clarify these questions. Clinical Social Worker received phone call from pt insurance stating that pt clinical information was being sent to [employer] geologist of insurance company to determine if pt meets inpatient criteria to remain in the hospital through weekend and asked Clinical Social Worker to notify pt family. Clinical Social Worker notified pt son during discussion. Clinical Social Worker will ask weekend Clinical Social Worker to follow up with family over weekend for decision for SNF. Clinical Social Worker to follow.  Jacklynn Lewis, MSW, LCSWA  Clinical Social Work [phone]

## 2011-12-22 NOTE — Progress Notes (Signed)
PARENTERAL NUTRITION CONSULT NOTE - FOLLOW UP  Pharmacy Consult for TNA  Indication: Poor oral intake 2/2 radiation esophagitis  No Known Allergies  Patient Measurements: Height: 5\' 10"  (177.8 cm) Weight: 135 lb 12.8 oz (61.598 kg) IBW/kg (Calculated) : 73  Usual Weight: 71 kg   Vital Signs: Temp: 98.4 F (36.9 C) (10/25 0530) Temp src: Oral (10/25 0530) BP: 98/60 mmHg (10/25 0530) Pulse Rate: 106  (10/25 0530) Intake/Output from previous day: 10/24 0701 - 10/25 0700 In: 396 [P.O.:120; I.V.:136; IV Piggyback:100] Out: 1025 [Urine:1025] Intake/Output from this shift:    Labs: No results found for this basename: WBC:3,HGB:3,HCT:3,PLT:3,APTT:3,INR:3 in the last 72 hours   Basename 12/21/11 0610  NA 131*  K 4.1  CL 97  CO2 27  GLUCOSE 128*  BUN 20  CREATININE 0.88  LABCREA --  CREAT24HRUR --  CALCIUM 8.5  MG 1.4*  PHOS 3.6  PROT 5.5*  ALBUMIN 2.3*  AST 19  ALT 14  ALKPHOS 84  BILITOT 0.2*  BILIDIR --  IBILI --  PREALBUMIN --  TRIG --  CHOLHDL --  CHOL --   Estimated Creatinine Clearance: 61.3 ml/min (by C-G formula based on Cr of 0.88).    Basename 12/21/11 2353 12/21/11 1655 12/21/11 0817  GLUCAP 126* 138* 127*    Insulin Requirements in the past 24 hours:  CBGs < 150, required 6 units moderate SSI Q8h  Nutritional Goals RD rec 10/16: Kcal:1950-2300, Protein: 85-100g, Fluid:1.9-2.3L Clinimix E5/20 at a goal rate of 80 ml/hr with lipids MWF will provide 96 g protein daily and 1689 kcal TTSS, 2169 kcal MWF, and an average of 1790 kcal/day.   Current Nutrition Clinimix E5/20 @ 80 ml/hr Lipids 20% @ 82ml/hr on MWF Regular diet starting 10/15 but poor appetite/poor oral intake  Asssessment: 79 yom with h/o stage IIIB NSCLC s/p concurrent chemoradiation for 6 weeks (carboplatin/paclitaxel) with last doses given 10/813. Patient presented 12/12/2011 for intractable nausea and vomiting associated with dysphagia and thrush secondary to radiation induced  esophagitis. TNA started 10/16 for poor oral intake and protein calorie malnutrition. Chemo and radiation currently on hold until improvement in condition. Spouse stated no further Rad/Onc for patient.  -Note low BP 10/21 associated with temp spike, fluid bolus given. Also c/o urinary retention, foley since 10/21.  10/23: regular diet oral but oral intake is poor, pt states oral Lidocaine of no use and refusing. Outpt 3.5L/ 24 hr. TNA does not appear to blunt appetite. 10/24:  per MD patient needs ongoing nutrition with TNA 10/25: no labs  Labs (10/24) Renal function: Scr stable/wnl Hepatic function: all wnl Electrolytes: Na+ low (cannot be adjusted in TNA),  Mag = 1.4 (Low), Phos 3.6 (WNL). (Unable to adjust lytes in pre-mixed TNA) Pre-Albumin: 10.8 (10/17), 5.6 (10/21) TG/Cholesterol: wnl 10/17, 10/21  Plan:  At 1800 tonight  Continue Clinimix E5/20 at goal of 80 ml/hr  TNA to contain IV fat emulsion, standard multivitamins and trace elements only on MWF only due to ongoing shortage  Continue SSI q8h  TNA labs Monday/Thursdays  Pharmacy will follow up daily  IV NS at 50 ml/hr this am, monitor BP  Tammy Sours, Pharm.D. Clinical Oncology Pharmacist  Pager # [phone]  12/22/2011 7:38 AM

## 2011-12-23 LAB — BASIC METABOLIC PANEL
BUN: 24 mg/dL — ABNORMAL HIGH (ref 6–23)
CO2: 29 mEq/L (ref 19–32)
Calcium: 8.8 mg/dL (ref 8.4–10.5)
Chloride: 96 mEq/L (ref 96–112)
Creatinine, Ser: 0.92 mg/dL (ref 0.50–1.35)
GFR calc Af Amer: >90 mL/min (ref >90)
GFR calc non Af Amer: 79 mL/min — ABNORMAL LOW (ref >90)
Glucose, Bld: 129 mg/dL — ABNORMAL HIGH (ref 70–99)
Potassium: 4.1 mEq/L (ref 3.5–5.1)
Sodium: 132 mEq/L — ABNORMAL LOW (ref 135–145)

## 2011-12-23 LAB — CBC
HCT: 24.4 % — ABNORMAL LOW (ref 39.0–52.0)
Hemoglobin: 8.3 g/dL — ABNORMAL LOW (ref 13.0–17.0)
MCH: 27.7 pg (ref 26.0–34.0)
MCHC: 34 g/dL (ref 30.0–36.0)
MCV: 81.3 fL (ref 78.0–100.0)
Platelets: 145 10*3/uL — ABNORMAL LOW (ref 150–400)
RBC: 3 MIL/uL — ABNORMAL LOW (ref 4.22–5.81)
RDW: 17 % — ABNORMAL HIGH (ref 11.5–15.5)
WBC: 4.4 10*3/uL (ref 4.0–10.5)

## 2011-12-23 LAB — GLUCOSE, CAPILLARY
Glucose-Capillary: 129 mg/dL — ABNORMAL HIGH (ref 70–99)
Glucose-Capillary: 134 mg/dL — ABNORMAL HIGH (ref 70–99)

## 2011-12-23 LAB — MAGNESIUM: Magnesium: 1.7 mg/dL (ref 1.5–2.5)

## 2011-12-23 MED ORDER — CLINIMIX E/DEXTROSE (5/20) 5 % IV SOLN
INTRAVENOUS | Status: AC
  Administered 2011-12-23: 18:00:00 via INTRAVENOUS
  Filled 2011-12-23: qty 2000

## 2011-12-23 NOTE — Progress Notes (Signed)
PARENTERAL NUTRITION CONSULT NOTE - FOLLOW UP  Pharmacy Consult for TNA  Indication: Poor oral intake 2/2 radiation esophagitis  No Known Allergies  Patient Measurements: Height: 5\' 10"  (177.8 cm) Weight: 135 lb 12.8 oz (61.598 kg) IBW/kg (Calculated) : 73  Usual Weight: 71 kg   Vital Signs: Temp: 98.2 F (36.8 C) (10/26 0520) Temp src: Oral (10/26 0520) BP: 100/60 mmHg (10/26 0520) Pulse Rate: 94  (10/26 0520) Intake/Output from previous day: 10/25 0701 - 10/26 0700 In: 0  Out: 1026 [Urine:1025; Stool:1] Intake/Output from this shift:    Labs:  [facility] 12/23/11 0624  WBC 4.4  HGB 8.3*  HCT 24.4*  PLT 145*  APTT --  INR --     Basename 12/23/11 0624 12/21/11 0610  NA 132* 131*  K 4.1 4.1  CL 96 97  CO2 29 27  GLUCOSE 129* 128*  BUN 24* 20  CREATININE 0.92 0.88  LABCREA -- --  CREAT24HRUR -- --  CALCIUM 8.8 8.5  MG 1.7 1.4*  PHOS -- 3.6  PROT -- 5.5*  ALBUMIN -- 2.3*  AST -- 19  ALT -- 14  ALKPHOS -- 84  BILITOT -- 0.2*  BILIDIR -- --  IBILI -- --  PREALBUMIN -- --  TRIG -- --  CHOLHDL -- --  CHOL -- --   Estimated Creatinine Clearance: 58.6 ml/min (by C-G formula based on Cr of 0.92).   Insulin Requirements in the past 24 hours:  CBGs 153, 144, 134, 129 Required 7 units moderate SSI Q8h  Nutritional Goals RD rec 10/16: Kcal:1950-2300, Protein: 85-100g, Fluid:1.9-2.3L Clinimix E5/20 at a goal rate of 80 ml/hr with lipids MWF will provide 96 g protein daily and 1689 kcal TTSS, 2169 kcal MWF, and an average of 1790 kcal/day.   Current Nutrition Clinimix E5/20 @ 80 ml/hr Lipids 20% @ 8ml/hr on MWF Regular, dysphagia 3 diet but poor appetite/poor oral intake  Asssessment: 79 yom with h/o stage IIIB NSCLC s/p concurrent chemoradiation for 6 weeks (carboplatin/paclitaxel) with last doses given 10/813. Patient presented 12/12/2011 for intractable nausea and vomiting associated with dysphagia and thrush secondary to radiation induced  esophagitis. TNA started 10/16 for poor oral intake and protein calorie malnutrition. Chemo and radiation currently on hold until improvement in condition. Spouse stated no further Rad/Onc for patient.  Labs: Renal function: Scr stable/wnl Hepatic function: all wnl Electrolytes: Na+ low (cannot be adjusted in TNA),  Mag low/normal, Phos wnl 10/24 on KPhos 250mg  PO daily Pre-Albumin: 10.8 (10/17), 5.6 (10/21) TG/Cholesterol: wnl 10/17, 10/21  Plan:    Continue Clinimix E5/20 at goal of 80 ml/hr  TNA to contain IV fat emulsion, standard multivitamins and trace elements only on MWF only due to ongoing shortage  Continue SSI q8h  TNA labs Monday/Thursdays  Pharmacy will follow up daily  Loralee Pacas, PharmD, BCPS Pager: [phone] 12/23/2011 8:19 AM

## 2011-12-23 NOTE — Progress Notes (Signed)
[facility] PROGRESS NOTE  Bowe Bondurant XBJ:[id number] DOB: Dec 26, 1932 DOA: 12/12/2011 PCP: [name], MD  Brief narrative: Mr. Justin Pittman is a 78 year old male with a PMH of stage IIIB non-small cell lung cancer, squamous cell carcinoma with right lower lobe lung mass as well as left hilar lymphadenopathy (diagnosed in August of 2013) receiving chemoradiation with weekly carboplatin and paclitaxel, status post 6 weekly doses of treatment last dose was given on 12/05/2011. Patient presented 12/12/2011 with intractable nausea and vomiting associated with pain and difficulty with swallowing which was determined to be secondary to radiation esophagitis and oral thrush. Patient is receiving TNA for temporary nutritional support. Hospital course complicated by the development of possible aspiration pneumonia for which reason patient was started on empiric vanco and zosyn, which has now been narrowed to Levaquin. The barrier to discharge is ongoing TNA dependence and poor oral intake.    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  Refusing viscous lidocaine because of nausea.  Still with poor by mouth intake.  Will need skilled nursing home placement for ongoing nutritional support. Active Problems:  Aspiration pneumonia   Seen by speech therapist on 12/18/2011: recommended dysphagia 3 diet, thin liquids.  Continue to encourage po intake.   Initially treated with vanco and zosyn, but narrowed to Levaquin based on negative culture data.  Will treat through 12/24/11. Non-small cell lung cancer   Seen by Dr. Arbutus Ped on 12/18/2011.   Further radiation and chemotherapy on hold; Family does not want to proceed with further radiation treatments.  Dr. Arbutus Ped favors no further XRT. Pancytopenia / Anemia of chronic  disease   Likely secondary to sequela of chemotherapy.  White blood cell count is normal, hemoglobin is stable, platelet count is stable. The patient did receive one unit of packed red blood cells on 12/15/2011 Hypokalemia/ Hypomagnesemia   Monitor electrolytes closely and replenish when indicated. Protein calorie malnutrition   Secondary to malignancy and poor oral intake due to radiation esophagitis.  TNA initiated on 12/13/2011 with ongoing problems with poor oral intake.  Continue dysphagia 3 diet as tolerated. Hypertension   Blood pressure currently controlled.  Code Status: Full Family Communication: With permission, called and updated patient's wife 12/22/11.  Spoke with son at bedside 12/21/11. Disposition Plan: SNF placement.   Medical Consultants:  Dr. Si Gaul, Oncology  Billie Ruddy, PA, physical medicine and rehabilitation  Other Consultants:  Physical therapy: Recommend SNF.  Dietitian: Ongoing inadequate oral intake.  Pharmacy: T. and A. and antibiotic dosing.  Procedures:  PICC placement 12/13/2011.  Antibiotics:  Levaquin 12/19/2011--->  Diflucan 12/13/2011--->  Vancomycin 12/17/2011---> 12/19/2011  Zosyn 12/17/2011---> 12/19/2011   HPI/Subjective: [name] has no appetite.  He eats only a few bites of his meals.  No current complaints of uncontrolled pain or nausea/vomiting.  Objective: Filed Vitals:   12/21/11 2115 12/22/11 0530 12/22/11 1256 12/23/11 0520  BP: 113/65 98/60 127/55 100/60  Pulse: 106 106 97 94  Temp: 98.6 F (37 C) 98.4 F (36.9 C) 97.6 F (36.4 C) 98.2 F (36.8 C)  TempSrc: Oral Oral Oral Oral  Resp: 18 18 16 16   Height:      Weight:      SpO2: 99% 99% 99% 98%    Intake/Output Summary (Last 24 hours) at 12/23/11 0929 Last data filed at 12/23/11 0500  Gross  per 24 hour  Intake      0 ml  Output    825 ml  Net   -825 ml    Exam: Gen:  NAD Cardiovascular:  RRR, No M/R/G Respiratory: Lungs  CTAB Gastrointestinal: Abdomen soft, NT/ND with normal active bowel sounds. Extremities: No C/E/C  Data Reviewed: Basic Metabolic Panel:  Lab 12/23/11 1610 12/21/11 0610 12/19/11 0535 12/18/11 0455 12/17/11 0555  NA 132* 131* 135 130* 129*  K 4.1 4.1 -- -- --  CL 96 97 100 97 95*  CO2 29 27 27 26 26   GLUCOSE 129* 128* 127* 106* 107*  BUN 24* 20 18 20 15   CREATININE 0.92 0.88 0.90 0.93 0.79  CALCIUM 8.8 8.5 7.2* 8.2* 8.3*  MG 1.7 1.4* -- 1.8 1.5  PHOS -- 3.6 -- 4.5 --   GFR Estimated Creatinine Clearance: 58.6 ml/min (by C-G formula based on Cr of 0.92). Liver Function Tests:  Lab 12/21/11 0610 12/18/11 0455  AST 19 13  ALT 14 8  ALKPHOS 84 71  BILITOT 0.2* 0.5  PROT 5.5* 4.9*  ALBUMIN 2.3* 2.3*    CBC:  Lab 12/23/11 0624 12/19/11 0535 12/18/11 0455 12/17/11 0400  WBC 4.4 4.0 3.8* 2.2*  NEUTROABS -- -- 2.6 --  HGB 8.3* 8.5* 8.2* 8.5*  HCT 24.4* 24.9* 23.6* 25.3*  MCV 81.3 79.8 79.2 82.1  PLT 145* 130* 126* 125*   CBG:  Lab 12/23/11 0751 12/23/11 0004 12/22/11 1606 12/22/11 0752 12/21/11 2353  GLUCAP 129* 134* 144* 153* 126*   Lipid Profile No results found for this basename: CHOL:2,HDL:2,LDLCALC:2,TRIG:2,CHOLHDL:2,LDLDIRECT:2 in the last 72 hours Microbiology Recent Results (from the past 240 hour(s))  URINE CULTURE     Status: Normal   Collection Time   12/17/11  7:04 PM      Component Value Range Status Comment   Specimen Description URINE, CLEAN CATCH   Final    Special Requests Immunocompromised   Final    Culture  Setup Time 12/17/2011 23:02   Final    Colony Count NO GROWTH   Final    Culture NO GROWTH   Final    Report Status 12/18/2011 FINAL   Final   CULTURE, BLOOD (ROUTINE X 2)     Status: Normal (Preliminary result)   Collection Time   12/17/11  7:28 PM      Component Value Range Status Comment   Specimen Description BLOOD LEFT ARM  10 ML IN [facility] BOTTLE   Final    Special Requests Immunocompromised   Final    Culture  Setup Time 12/18/2011  10:16   Final    Culture     Final    Value:        BLOOD CULTURE RECEIVED NO GROWTH TO DATE CULTURE WILL BE HELD FOR 5 DAYS BEFORE ISSUING A FINAL NEGATIVE REPORT   Report Status PENDING   Incomplete   CULTURE, BLOOD (ROUTINE X 2)     Status: Normal (Preliminary result)   Collection Time   12/17/11  7:35 PM      Component Value Range Status Comment   Specimen Description BLOOD LEFT ARM  10 ML IN [facility] BOTTLE   Final    Special Requests Immunocompromised   Final    Culture  Setup Time 12/18/2011 10:16   Final    Culture     Final    Value:        BLOOD CULTURE RECEIVED NO GROWTH TO DATE CULTURE WILL BE HELD FOR  5 DAYS BEFORE ISSUING A FINAL NEGATIVE REPORT   Report Status PENDING   Incomplete      Studies:  Dg Chest 2 View 12/12/2011 IMPRESSION: Decreased size of the right perihilar mass with question of necrosis /cavitation.  Otherwise, no acute change identified.   Original Report Authenticated By: Waneta Martins, M.D.     Dg Chest Port 1 View 12/17/2011  IMPRESSION: No acute disease in the chest.   Original Report Authenticated By: Gwynn Burly, M.D.     Scheduled Meds:    . fluconazole (DIFLUCAN) IV  200 mg Intravenous Q24H  . insulin aspart  0-15 Units Subcutaneous Q8H  . levofloxacin (LEVAQUIN) IV  750 mg Intravenous Q24H  . pantoprazole (PROTONIX) IV  40 mg Intravenous Q24H  . phosphorus  250 mg Oral Daily  . polyethylene glycol  17 g Oral Daily  . sodium chloride  500 mL Intravenous Once  . sodium chloride  10-40 mL Intracatheter Q12H  . vitamin A & D       Continuous Infusions:    . sodium chloride 20 mL (12/22/11 0352)  . TPN (CLINIMIX) +/- additives 80 mL/hr at 12/22/11 1900   And  . fat emulsion 250 mL (12/22/11 1810)  . TPN (CLINIMIX) +/- additives 80 mL/hr at 12/21/11 1849  . TPN (CLINIMIX) +/- additives      Time spent: 25 minutes.   LOS: 11 days   RAMA,CHRISTINA  [facility] Pager [phone].  If 8PM-8AM, please contact  night-coverage at [email or web address], password [facility] 12/23/2011, 9:29 AM

## 2011-12-23 NOTE — Progress Notes (Signed)
Physical Therapy Pt politely declined due to fatigue as he just got back to bed from walking to and from the bathroom. Felecia Shelling  PTA [facility]  Acute  Rehab Pager     [phone]

## 2011-12-24 LAB — CULTURE, BLOOD (ROUTINE X 2): Culture: NO GROWTH

## 2011-12-24 LAB — GLUCOSE, CAPILLARY: Glucose-Capillary: 149 mg/dL — ABNORMAL HIGH (ref 70–99)

## 2011-12-24 MED ORDER — GUAIFENESIN-DM 100-10 MG/5ML PO SYRP
5.0000 mL | ORAL_SOLUTION | ORAL | Status: DC | PRN
  Administered 2011-12-24: 5 mL via ORAL
  Filled 2011-12-24: qty 10

## 2011-12-24 MED ORDER — CLINIMIX E/DEXTROSE (5/20) 5 % IV SOLN
INTRAVENOUS | Status: DC
  Filled 2011-12-24: qty 2000

## 2011-12-24 NOTE — Progress Notes (Signed)
Occupational Therapy Treatment Patient Details Name: Justin Pittman MRN: [id number] DOB: 1932-05-07 Today's Date: 12/24/2011 Time: 2440-1027 OT Time Calculation (min): 24 min  OT Assessment / Plan / Recommendation Comments on Treatment Session Pt requires encouragement for OOB to bathroom and to participate in ADL.  Endurance remains low as does his appetite.  Per MD note, may need SNF due to need for nutritional support.    Follow Up Recommendations  No OT follow up;Other (comment) (may need SNF for nutritional support)    Barriers to Discharge       Equipment Recommendations  Rolling walker with 5" wheels;3 in 1 bedside comode    Recommendations for Other Services    Frequency Min 2X/week   Plan      Precautions / Restrictions Precautions Precautions: Fall Restrictions Weight Bearing Restrictions: No   Pertinent Vitals/Pain No pain    ADL  Grooming: Performed;Wash/dry hands;Min guard Where Assessed - Grooming: Unsupported standing Lower Body Dressing: Performed;Set up;Other (comment) (socks) Where Assessed - Lower Body Dressing: Unsupported sitting Toilet Transfer: Performed;Minimal assistance Toilet Transfer Method: Other (comment) (standing at toilet) Toilet Transfer Equipment: Regular height toilet;Grab bars Toileting - Clothing Manipulation and Hygiene: Performed;Supervision/safety Where Assessed - Toileting Clothing Manipulation and Hygiene: Standing Equipment Used: Gait belt Transfers/Ambulation Related to ADLs: ambulate to bathroom holding onto IV pole  Min guard for gathering items ADL Comments: Pt requiring encouragement to walk to bathroom urinate rather than using urinal at bedside.  Pt continues to have low endurance, educated in pacing and use of DME for showering.  Pt reports having a stool at home he could use.  Talked with pt about ambulation device for home.  Stated he would just use the IV pole.  Pt reminded that he will not have the IV pole at home.  He  stated he didn't want to think about that.    OT Diagnosis:    OT Problem List:   OT Treatment Interventions:     OT Goals Acute Rehab OT Goals Time For Goal Achievement: 12/29/11 Potential to Achieve Goals: Good ADL Goals Pt Will Transfer to Toilet: with modified independence;Ambulation;Regular height toilet ADL Goal: Toilet Transfer - Progress: Progressing toward goals Miscellaneous OT Goals Miscellaneous OT Goal #1: Pt will retrieve clothes at mod I level with AD and complete ADL routine  Miscellaneous OT Goal #2: Pt will verbalize 3 energy conservation techniques OT Goal: Miscellaneous Goal #2 - Progress: Progressing toward goals  Visit Information  Last OT Received On: 12/24/11 Assistance Needed: +1    Subjective Data      Prior Functioning       Cognition  Overall Cognitive Status: Appears within functional limits for tasks assessed/performed Arousal/Alertness: Awake/alert Orientation Level: Appears intact for tasks assessed Behavior During Session: Flat affect    Mobility  Shoulder Instructions Bed Mobility Bed Mobility: Supine to Sit;Sit to Supine Supine to Sit: 6: Modified independent (Device/Increase time) Sit to Supine: 6: Modified independent (Device/Increase time) Transfers Transfers: Sit to Stand;Stand to Sit Sit to Stand: 4: Min guard;With upper extremity assist;From bed Stand to Sit: With upper extremity assist;To bed;4: Min guard       Exercises      Balance     End of Session OT - End of Session Activity Tolerance: Patient limited by fatigue Patient left: in bed;with call bell/phone within reach;with bed alarm set;Other (comment) (declined staying up in chair) Nurse Communication: Mobility status  GO     Evern Bio 12/24/2011, 10:14 AM [phone]

## 2011-12-24 NOTE — Progress Notes (Signed)
CSW left a message for wife to call the weekday CSW for SNF bed choice for d/c planning.   Weekday CSW to follow-up with placement and d/c planning.   Leron Croak, LCSWA [employer] [phone]

## 2011-12-24 NOTE — Progress Notes (Addendum)
PARENTERAL NUTRITION CONSULT NOTE - FOLLOW UP  Pharmacy Consult for TNA  Indication: Poor oral intake 2/2 radiation esophagitis  No Known Allergies  Patient Measurements: Height: 5\' 10"  (177.8 cm) Weight: 135 lb 12.8 oz (61.598 kg) IBW/kg (Calculated) : 73  Usual Weight: 71 kg   Vital Signs: Temp: 97.7 F (36.5 C) (10/27 0550) Temp src: Oral (10/27 0550) BP: 134/62 mmHg (10/27 0550) Pulse Rate: 102  (10/27 0550) Intake/Output from previous day: 10/26 0701 - 10/27 0700 In: 460 [P.O.:460] Out: 2575 [Urine:2575] Intake/Output from this shift: Total I/O In: 100 [P.O.:100] Out: 800 [Urine:800]  Labs:  [facility] 12/23/11 0624  WBC 4.4  HGB 8.3*  HCT 24.4*  PLT 145*  APTT --  INR --     Basename 12/23/11 0624  NA 132*  K 4.1  CL 96  CO2 29  GLUCOSE 129*  BUN 24*  CREATININE 0.92  LABCREA --  CREAT24HRUR --  CALCIUM 8.8  MG 1.7  PHOS --  PROT --  ALBUMIN --  AST --  ALT --  ALKPHOS --  BILITOT --  BILIDIR --  IBILI --  PREALBUMIN --  TRIG --  CHOLHDL --  CHOL --   Estimated Creatinine Clearance: 58.6 ml/min (by C-G formula based on Cr of 0.92).   Insulin Requirements in the past 24 hours:  CBGs 129-143 Required 6 units moderate SSI Q8h  Nutritional Goals RD rec 10/16: Kcal:1950-2300, Protein: 85-100g, Fluid:1.9-2.3L Clinimix E5/20 at a goal rate of 80 ml/hr with lipids MWF will provide 96 g protein daily and 1689 kcal TTSS, 2169 kcal MWF, and an average of 1896 kcal/day.   Current Nutrition Clinimix E 5/20 @ 80 ml/hr Lipids 20% @ 88ml/hr on MWF Regular, dysphagia 3 diet - 460 mL PO intake recorded yesterday  Asssessment: 79 yom with h/o stage IIIB NSCLC s/p concurrent chemoradiation for 6 weeks (carboplatin/paclitaxel) with last doses given 12/05/11. Patient presented 12/12/2011 for intractable nausea and vomiting associated with dysphagia and thrush secondary to radiation induced esophagitis. TNA started 10/16 for poor oral intake and protein  calorie malnutrition. Chemo and radiation currently on hold until improvement in condition. Spouse stated no further Rad/Onc for patient.  Planning SNF placement for ongoing nutritional support.  Labs: Renal function: Scr stable/wnl Hepatic function: all wnl Electrolytes: Na+ remains low (cannot be adjusted in TNA), Mag wnl (10/26), Phos wnl 10/24 on KPhos 250mg  PO daily Pre-Albumin: 10.8 (10/17), 5.6 (10/21) TG/Cholesterol: wnl 10/17, 10/21  Plan:    Continue Clinimix E 5/20 at goal of 80 ml/hr  TNA to contain IV fat emulsion, standard multivitamins and trace elements only on MWF only due to ongoing shortage  Continue SSI q8h  TNA labs Monday/Thursdays  Pharmacy will follow up daily  Clance Boll, PharmD, BCPS Pager: [phone] 12/24/2011 7:05 AM   Addendum: 12/24/2011 9:45 AM New order from MD to wean off TNA by 10 ml/hr. Plan: will update RN on plan to decrease TNA rate by 10 ml/hr per MD order until 30 ml/hr and then can d/c.  TNA should stop at ~1500.  Will specify instructions in TNA administration instructions.  Clance Boll, PharmD, BCPS Pager: [phone] 12/24/2011 9:48 AM

## 2011-12-24 NOTE — Progress Notes (Signed)
[facility] PROGRESS NOTE  Justin Pittman HYQ:[id number] DOB: 1932-08-20 DOA: 12/12/2011 PCP: [name], MD  Brief narrative: Justin Pittman is a 78 year old male with a PMH of stage IIIB non-small cell lung cancer, squamous cell carcinoma with right lower lobe lung mass as well as left hilar lymphadenopathy (diagnosed in August of 2013) receiving chemoradiation with weekly carboplatin and paclitaxel, status post 6 weekly doses of treatment last dose was given on 12/05/2011. Patient presented 12/12/2011 with intractable nausea and vomiting and radiation esophagitis / oral thrush. Patient now receiving TNA for nutritional support. Hospital course complicated by the development of possible aspiration pneumonia, for which he is being treated with antibiotics. The barrier to discharge is ongoing TNA dependence and poor oral intake.   Assessment/Plan: Principal Problem:  *Radiation esophagitis in the setting of non small cell lung carcinoma status post radiation therapy   Being treated with Continue fluconazole 200 mg daily, Protonix 40 mg IV daily.  Refusing viscous lidocaine because of nausea.  Still with poor by mouth intake.  Will need skilled nursing home placement for ongoing nutritional support. Active Problems:  Aspiration pneumonia   Seen by speech therapist on 12/18/2011: recommended dysphagia 3 diet, thin liquids.  Continue to encourage po intake.   Initially treated with vanco and zosyn, but narrowed to Levaquin based on negative culture data.  Will treat through 12/24/11. Non-small cell lung cancer   Seen by Dr. Arbutus Ped on 12/18/2011.   Further radiation and chemotherapy on hold; Family does not want to proceed with further radiation treatments.  Dr. Arbutus Ped favors no further XRT. Pancytopenia / Anemia of chronic disease   Likely secondary to sequela of chemotherapy.  White blood cell count is normal, hemoglobin is stable, platelet count is stable. The patient did  receive one unit of packed red blood cells on 12/15/2011 Hypokalemia/ Hypomagnesemia   Monitor electrolytes closely and replenish when indicated. Protein calorie malnutrition   Secondary to malignancy and poor oral intake due to radiation esophagitis.  TNA initiated on 12/13/2011 with ongoing problems with poor oral intake.  Continue dysphagia 3 diet as tolerated. Hypertension   Blood pressure currently controlled.  Code Status: Full Family Communication: With permission, called and updated patient's wife 12/22/11.  Spoke with son at bedside 12/21/11. Disposition Plan: SNF placement.   Medical Consultants:  Dr. Si Gaul, Oncology  Billie Ruddy, PA, physical medicine and rehabilitation  Other Consultants:  Physical therapy: Recommend SNF.  Dietitian: Ongoing inadequate oral intake.  Pharmacy: TNA. and antibiotic dosing.  Procedures:  PICC placement 12/13/2011.  Antibiotics:  Levaquin 12/19/2011--->  Diflucan 12/13/2011--->  Vancomycin 12/17/2011---> 12/19/2011  Zosyn 12/17/2011---> 12/19/2011   HPI/Subjective: Justin Pittman continues to state that he feels poorly.  He continues to have pain with swallowing and very little appetite.  He says he is not having any difficulty with passing his urine.  Spoke at length with his wife who is adamant that he not be d/c'd to SNF, that it would "kill him".  We spoke about how TNA is a short term treatment, and that prolonged use will result in atrophy of the gut, and ongoing issues with poor appetite.  Reluctantly agreed with my recommendation that we begin to slowly wean the TNA.  Objective: Filed Vitals:   12/23/11 0520 12/23/11 1425 12/23/11 2044 12/24/11 0550  BP: 100/60 109/62 129/67 134/62  Pulse: 94 100 99 102  Temp: 98.2 F (36.8 C) 98 F (36.7 C) 97.7 F (36.5 C) 97.7 F (36.5 C)  TempSrc: Oral  Oral Oral Oral  Resp: 16 16 18 15   Height:      Weight:      SpO2: 98% 93% 99% 100%    Intake/Output  Summary (Last 24 hours) at 12/24/11 0710 Last data filed at 12/24/11 0981  Gross per 24 hour  Intake    460 ml  Output   2675 ml  Net  -2215 ml    Exam: Gen:  NAD Cardiovascular:  RRR, No M/R/G Respiratory: Lungs CTAB Gastrointestinal: Abdomen soft, NT/ND with normal active bowel sounds. Extremities: No C/E/C  Data Reviewed: Basic Metabolic Panel:  Lab 12/23/11 1914 12/21/11 0610 12/19/11 0535 12/18/11 0455  NA 132* 131* 135 130*  K 4.1 4.1 -- --  CL 96 97 100 97  CO2 29 27 27 26   GLUCOSE 129* 128* 127* 106*  BUN 24* 20 18 20   CREATININE 0.92 0.88 0.90 0.93  CALCIUM 8.8 8.5 7.2* 8.2*  MG 1.7 1.4* -- 1.8  PHOS -- 3.6 -- 4.5   GFR Estimated Creatinine Clearance: 58.6 ml/min (by C-G formula based on Cr of 0.92). Liver Function Tests:  Lab 12/21/11 0610 12/18/11 0455  AST 19 13  ALT 14 8  ALKPHOS 84 71  BILITOT 0.2* 0.5  PROT 5.5* 4.9*  ALBUMIN 2.3* 2.3*    CBC:  Lab 12/23/11 0624 12/19/11 0535 12/18/11 0455  WBC 4.4 4.0 3.8*  NEUTROABS -- -- 2.6  HGB 8.3* 8.5* 8.2*  HCT 24.4* 24.9* 23.6*  MCV 81.3 79.8 79.2  PLT 145* 130* 126*   CBG:  Lab 12/23/11 2350 12/23/11 1546 12/23/11 0751 12/23/11 0004 12/22/11 1606  GLUCAP 131* 143* 129* 134* 144*   Lipid Profile No results found for this basename: CHOL:2,HDL:2,LDLCALC:2,TRIG:2,CHOLHDL:2,LDLDIRECT:2 in the last 72 hours Microbiology Recent Results (from the past 240 hour(s))  URINE CULTURE     Status: Normal   Collection Time   12/17/11  7:04 PM      Component Value Range Status Comment   Specimen Description URINE, CLEAN CATCH   Final    Special Requests Immunocompromised   Final    Culture  Setup Time 12/17/2011 23:02   Final    Colony Count NO GROWTH   Final    Culture NO GROWTH   Final    Report Status 12/18/2011 FINAL   Final   CULTURE, BLOOD (ROUTINE X 2)     Status: Normal (Preliminary result)   Collection Time   12/17/11  7:28 PM      Component Value Range Status Comment   Specimen Description  BLOOD LEFT ARM  10 ML IN [facility] BOTTLE   Final    Special Requests Immunocompromised   Final    Culture  Setup Time 12/18/2011 10:16   Final    Culture     Final    Value:        BLOOD CULTURE RECEIVED NO GROWTH TO DATE CULTURE WILL BE HELD FOR 5 DAYS BEFORE ISSUING A FINAL NEGATIVE REPORT   Report Status PENDING   Incomplete   CULTURE, BLOOD (ROUTINE X 2)     Status: Normal (Preliminary result)   Collection Time   12/17/11  7:35 PM      Component Value Range Status Comment   Specimen Description BLOOD LEFT ARM  10 ML IN [facility] BOTTLE   Final    Special Requests Immunocompromised   Final    Culture  Setup Time 12/18/2011 10:16   Final    Culture     Final  Value:        BLOOD CULTURE RECEIVED NO GROWTH TO DATE CULTURE WILL BE HELD FOR 5 DAYS BEFORE ISSUING A FINAL NEGATIVE REPORT   Report Status PENDING   Incomplete      Studies:  Dg Chest 2 View 12/12/2011 IMPRESSION: Decreased size of the right perihilar mass with question of necrosis /cavitation.  Otherwise, no acute change identified.   Original Report Authenticated By: Waneta Martins, M.D.     Dg Chest Port 1 View 12/17/2011  IMPRESSION: No acute disease in the chest.   Original Report Authenticated By: Gwynn Burly, M.D.     Scheduled Meds:    . fluconazole (DIFLUCAN) IV  200 mg Intravenous Q24H  . insulin aspart  0-15 Units Subcutaneous Q8H  . levofloxacin (LEVAQUIN) IV  750 mg Intravenous Q24H  . pantoprazole (PROTONIX) IV  40 mg Intravenous Q24H  . phosphorus  250 mg Oral Daily  . polyethylene glycol  17 g Oral Daily  . sodium chloride  500 mL Intravenous Once  . sodium chloride  10-40 mL Intracatheter Q12H   Continuous Infusions:    . sodium chloride 20 mL (12/22/11 0352)  . TPN (CLINIMIX) +/- additives 80 mL/hr at 12/22/11 1900   And  . fat emulsion 250 mL (12/22/11 1810)  . TPN (CLINIMIX) +/- additives 80 mL/hr at 12/23/11 1738  . TPN (CLINIMIX) +/- additives      Time spent: 25 minutes.   LOS: 12  days   Carolanne Mercier  [facility] Pager [phone].  If 8PM-8AM, please contact night-coverage at [email or web address], password [facility] 12/24/2011, 7:10 AM

## 2011-12-25 ENCOUNTER — Ambulatory Visit: Payer: Medicare Other

## 2011-12-25 ENCOUNTER — Telehealth: Payer: Self-pay | Admitting: Medical Oncology

## 2011-12-25 LAB — COMPREHENSIVE METABOLIC PANEL
ALT: 53 U/L (ref 0–53)
Albumin: 2.7 g/dL — ABNORMAL LOW (ref 3.5–5.2)
Alkaline Phosphatase: 135 U/L — ABNORMAL HIGH (ref 39–117)
BUN: 27 mg/dL — ABNORMAL HIGH (ref 6–23)
Calcium: 9.1 mg/dL (ref 8.4–10.5)
Potassium: 4.3 mEq/L (ref 3.5–5.1)
Sodium: 132 mEq/L — ABNORMAL LOW (ref 135–145)
Total Protein: 6.2 g/dL (ref 6.0–8.3)

## 2011-12-25 LAB — CBC
Hemoglobin: 9 g/dL — ABNORMAL LOW (ref 13.0–17.0)
MCHC: 34.4 g/dL (ref 30.0–36.0)
Platelets: 188 10*3/uL (ref 150–400)
RBC: 3.26 MIL/uL — ABNORMAL LOW (ref 4.22–5.81)

## 2011-12-25 LAB — GLUCOSE, CAPILLARY
Glucose-Capillary: 101 mg/dL — ABNORMAL HIGH (ref 70–99)
Glucose-Capillary: 148 mg/dL — ABNORMAL HIGH (ref 70–99)

## 2011-12-25 LAB — DIFFERENTIAL
Basophils Relative: 0 % (ref 0–1)
Monocytes Relative: 12 % (ref 3–12)
Neutro Abs: 3.7 10*3/uL (ref 1.7–7.7)
Neutrophils Relative %: 73 % (ref 43–77)

## 2011-12-25 LAB — TRIGLYCERIDES: Triglycerides: 112 mg/dL (ref <150)

## 2011-12-25 LAB — MAGNESIUM: Magnesium: 1.6 mg/dL (ref 1.5–2.5)

## 2011-12-25 LAB — PREALBUMIN: Prealbumin: 13 mg/dL — ABNORMAL LOW (ref 17.0–34.0)

## 2011-12-25 LAB — CHOLESTEROL, TOTAL: Cholesterol: 96 mg/dL (ref 0–200)

## 2011-12-25 LAB — PHOSPHORUS: Phosphorus: 4.2 mg/dL (ref 2.3–4.6)

## 2011-12-25 MED ORDER — CLINIMIX E/DEXTROSE (5/20) 5 % IV SOLN: INTRAVENOUS | Status: AC

## 2011-12-25 MED ORDER — CLINIMIX E/DEXTROSE (5/20) 5 % IV SOLN
INTRAVENOUS | Status: DC
Start: 2011-12-25 — End: 2011-12-25

## 2011-12-25 MED ORDER — TAMSULOSIN HCL 0.4 MG PO CAPS
0.4000 mg | ORAL_CAPSULE | Freq: Every day | ORAL | Status: DC
  Administered 2011-12-25 – 2011-12-28 (×3): 0.4 mg via ORAL
  Filled 2011-12-25 (×4): qty 1

## 2011-12-25 MED ORDER — CEFAZOLIN SODIUM 1-5 GM-% IV SOLN
1.0000 g | INTRAVENOUS | Status: AC
  Administered 2011-12-26: 1 g via INTRAVENOUS
  Filled 2011-12-25: qty 50

## 2011-12-25 MED ORDER — FAT EMULSION 20 % IV EMUL
250.0000 mL | INTRAVENOUS | Status: AC
  Administered 2011-12-25: 250 mL via INTRAVENOUS
  Filled 2011-12-25: qty 250

## 2011-12-25 MED ORDER — CLINIMIX E/DEXTROSE (5/20) 5 % IV SOLN
INTRAVENOUS | Status: DC
  Administered 2011-12-25: 10:00:00 via INTRAVENOUS
  Filled 2011-12-25: qty 1000

## 2011-12-25 MED ORDER — ZINC TRACE METAL 1 MG/ML IV SOLN
INTRAVENOUS | Status: AC
  Administered 2011-12-25: 17:00:00 via INTRAVENOUS
  Filled 2011-12-25: qty 2000

## 2011-12-25 NOTE — Progress Notes (Addendum)
[facility] PROGRESS NOTE  Xiang Reum AVW:[id number] DOB: 25-Feb-1933 DOA: 12/12/2011 PCP: [name], MD  Brief narrative: Justin Pittman is a 78 year old male with a PMH of stage IIIB non-small cell lung cancer, squamous cell carcinoma with right lower lobe lung mass as well as left hilar lymphadenopathy (diagnosed in August of 2013) receiving chemoradiation with weekly carboplatin and paclitaxel, status post 6 weekly doses of treatment last dose was given on 12/05/2011. Patient presented 12/12/2011 with intractable nausea and vomiting and radiation esophagitis / oral thrush. Patient now receiving TNA for nutritional support. Hospital course complicated by the development of possible aspiration pneumonia, for which he is being treated with antibiotics. The barrier to discharge is ongoing TNA dependence and poor oral intake.   Assessment/Plan: Principal Problem:  *Radiation esophagitis in the setting of non small cell lung carcinoma status post radiation therapy   Continue fluconazole 200 mg daily, Protonix 40 mg IV daily.  Refusing viscous lidocaine because of nausea.  Still with poor by mouth intake.  Will need skilled nursing home placement versus home health RN for ongoing nutritional support.  The patient's wife does not think she can handle him at home, and was not satisfied with either SNF option that was capable.    Attempting slow TNA wean to improve appetite. Active Problems:  Urinary Retention  Developed urinary retention necessitating temporary placement of a foley catheter on 12/17/11.  Foley subsequently removed 12/21/11 with no evidence of ongoing issues.  Will have RN scan bladder after next void to fully assess. Aspiration pneumonia   Seen by speech therapist on 12/18/2011: recommended dysphagia 3 diet, thin liquids.  Continue to encourage po intake.   Initially treated with vanco and zosyn, but narrowed to Levaquin based on negative culture data.  Treated  through 12/24/11. Non-small cell lung cancer   Seen by Dr. Arbutus Ped on 12/18/2011.   Further radiation and chemotherapy on hold; Family does not want to proceed with further radiation treatments.  Dr. Arbutus Ped favors no further XRT.  Explained to the patient's wife that the patient's disease is not curable.  Did not seem to understand this.  Explained the difference between treatable and curable.  Offered to get palliative care involved for goals of care, symptom management and discharge planning, but patient's wife declined. Pancytopenia / Anemia of chronic disease   Likely secondary to sequela of chemotherapy.  White blood cell count is normal, hemoglobin is stable, platelet count is stable. The patient did receive one unit of packed red blood cells on 12/15/2011 Hypokalemia/ Hypomagnesemia   Monitor electrolytes closely and replenish when indicated. Protein calorie malnutrition   Secondary to malignancy and poor oral intake due to radiation esophagitis.  TNA initiated on 12/13/2011 with ongoing problems with poor oral intake.  Slow wean (by 10 cc/hr/day) ordered, but weaned off overnight.  Will re-order.  Continue dysphagia 3 diet as tolerated. Hypertension   Blood pressure currently controlled.  Code Status: Full Family Communication: With permission, called and updated patient's wife 12/24/11.  Spoke with son at bedside 12/21/11.  [name] remains opposed to his discharge from the hospital despite being assured that he no longer needs the acute level of care he is currently receiving.  She refuses to participate in d/c planning.  Does not feel she can manage his care at home, and will not consent to either SNF she received bed offers from.  Wants to speak with Dr. Arbutus Ped (who informs me that she is demanding that he get involved  to prevent his d/c).  I fully explained, over the course of 2 telephone conversations, that Mr. Borsch is medically stable for transfer to a SNF.    Disposition Plan: SNF placement.   Medical Consultants:  Dr. Si Gaul, Oncology  Billie Ruddy, PA, physical medicine and rehabilitation  Other Consultants:  Physical therapy: Recommend SNF.  Dietitian: Ongoing inadequate oral intake.  Pharmacy: TNA. and antibiotic dosing.  Procedures:  PICC placement 12/13/2011.  Antibiotics:  Levaquin 12/19/2011--->12/25/11  Diflucan 12/13/2011--->  Vancomycin 12/17/2011---> 12/19/2011  Zosyn 12/17/2011---> 12/19/2011   HPI/Subjective: Justin Pittman had an episode of vomiting up yellow emesis after a coughing spell this morning.  He denies any issues with urinary retention, despite his wife telling me he had trouble urinating yesterday.  Still with poor appetite.  Objective: Filed Vitals:   12/24/11 0550 12/24/11 1355 12/24/11 2059 12/25/11 0545  BP: 134/62 113/65 109/64 106/63  Pulse: 102 90 92 89  Temp: 97.7 F (36.5 C) 98.3 F (36.8 C) 97.5 F (36.4 C) 97.4 F (36.3 C)  TempSrc: Oral Oral Oral Oral  Resp: 15 18 16 15   Height:      Weight:      SpO2: 100% 99% 98% 99%    Intake/Output Summary (Last 24 hours) at 12/25/11 0848 Last data filed at 12/25/11 0545  Gross per 24 hour  Intake 7690.67 ml  Output   1575 ml  Net 6115.67 ml    Exam: Gen:  NAD Cardiovascular:  RRR, No M/R/G Respiratory: Lungs CTAB Gastrointestinal: Abdomen soft, NT/ND with normal active bowel sounds. Extremities: No C/E/C  Data Reviewed: Basic Metabolic Panel:  Lab 12/25/11 9604 12/23/11 0624 12/21/11 0610 12/19/11 0535  NA 132* 132* 131* 135  K 4.3 4.1 -- --  CL 95* 96 97 100  CO2 27 29 27 27   GLUCOSE 95 129* 128* 127*  BUN 27* 24* 20 18  CREATININE 0.92 0.92 0.88 0.90  CALCIUM 9.1 8.8 8.5 7.2*  MG 1.6 1.7 1.4* --  PHOS 4.2 -- 3.6 --   GFR Estimated Creatinine Clearance: 58.6 ml/min (by C-G formula based on Cr of 0.92). Liver Function Tests:  Lab 12/25/11 0600 12/21/11 0610  AST 39* 19  ALT 53 14  ALKPHOS 135* 84   BILITOT 0.6 0.2*  PROT 6.2 5.5*  ALBUMIN 2.7* 2.3*    CBC:  Lab 12/25/11 0600 12/23/11 0624 12/19/11 0535  WBC 5.0 4.4 4.0  NEUTROABS 3.7 -- --  HGB 9.0* 8.3* 8.5*  HCT 26.2* 24.4* 24.9*  MCV 80.4 81.3 79.8  PLT 188 145* 130*   CBG:  Lab 12/25/11 0832 12/24/11 2326 12/24/11 0747 12/23/11 2350 12/23/11 1546  GLUCAP 101* 98 149* 131* 143*   Lipid Profile  Basename 12/25/11 0600  CHOL 96  HDL --  LDLCALC --  TRIG 112  CHOLHDL --  LDLDIRECT --   Microbiology Recent Results (from the past 240 hour(s))  URINE CULTURE     Status: Normal   Collection Time   12/17/11  7:04 PM      Component Value Range Status Comment   Specimen Description URINE, CLEAN CATCH   Final    Special Requests Immunocompromised   Final    Culture  Setup Time 12/17/2011 23:02   Final    Colony Count NO GROWTH   Final    Culture NO GROWTH   Final    Report Status 12/18/2011 FINAL   Final   CULTURE, BLOOD (ROUTINE X 2)     Status: Normal  Collection Time   12/17/11  7:28 PM      Component Value Range Status Comment   Specimen Description BLOOD LEFT ARM  10 ML IN [facility] BOTTLE   Final    Special Requests Immunocompromised   Final    Culture  Setup Time 12/18/2011 10:16   Final    Culture NO GROWTH 5 DAYS   Final    Report Status 12/24/2011 FINAL   Final   CULTURE, BLOOD (ROUTINE X 2)     Status: Normal   Collection Time   12/17/11  7:35 PM      Component Value Range Status Comment   Specimen Description BLOOD LEFT ARM  10 ML IN [facility] BOTTLE   Final    Special Requests Immunocompromised   Final    Culture  Setup Time 12/18/2011 10:16   Final    Culture NO GROWTH 5 DAYS   Final    Report Status 12/24/2011 FINAL   Final      Studies:  Dg Chest 2 View 12/12/2011 IMPRESSION: Decreased size of the right perihilar mass with question of necrosis /cavitation.  Otherwise, no acute change identified.   Original Report Authenticated By: Waneta Martins, M.D.     Dg Chest Port 1 View 12/17/2011   IMPRESSION: No acute disease in the chest.   Original Report Authenticated By: Gwynn Burly, M.D.     Scheduled Meds:    . fluconazole (DIFLUCAN) IV  200 mg Intravenous Q24H  . insulin aspart  0-15 Units Subcutaneous Q8H  . levofloxacin (LEVAQUIN) IV  750 mg Intravenous Q24H  . pantoprazole (PROTONIX) IV  40 mg Intravenous Q24H  . phosphorus  250 mg Oral Daily  . polyethylene glycol  17 g Oral Daily  . sodium chloride  500 mL Intravenous Once  . sodium chloride  10-40 mL Intracatheter Q12H   Continuous Infusions:    . sodium chloride 20 mL (12/22/11 0352)  . TPN (CLINIMIX) +/- additives 80 mL/hr at 12/23/11 1738  . DISCONTD: TPN (CLINIMIX) +/- additives      Time spent: 25 minutes face to face contact.  45 minutes spent on phone with wife on 2 separate phone calls.   LOS: 13 days   RAMA,CHRISTINA  [facility] Pager [phone].  If 8PM-8AM, please contact night-coverage at [email or web address], password [facility] 12/25/2011, 8:48 AM

## 2011-12-25 NOTE — Telephone Encounter (Signed)
Wife wants to schedule appt with Dr Arbutus Ped. She feels like he is being pressured to go to nursing home and he is very upset about it. She reports that he has been in hospital since October 15th.  She is getting a lot of information from different people about her husbands condition and she wants to schedule appt with  Dr Arbutus Ped to talk to him about her husbands condition. Dr Arbutus Ped notified.

## 2011-12-25 NOTE — Progress Notes (Signed)
  Radiation Oncology         [phone]) [phone] ________________________________  Name: Leovardo Thoman MRN: [id number]  Date: 12/08/2011  DOB: 16-Mar-1932  End of Treatment Note  Diagnosis:   78 year old gentleman with stage [id number] Squamous Cell Carcinoma of the Right Lung - Stage IIIB   Indication for treatment:  Curative, Definitive Radiotherapy          Radiation treatment dates:   10/31/2011-12/08/2011  Site/dose:   The gross disease in the patient's right lung was treated to 56 gray in 28 fractions of 2 gray  Beams/energy:   Helical intensity modulated radiotherapy was delivered using the TomoTherapy machine with image guidance.  Narrative: The patient tolerated the beginning of radiation treatment relatively well.   However, he experienced progressive decline in his performance status which may and partially related to radiation esophagitis and radiation induced fatigue. A major component of this was also likely related to his advanced age. Ultimately, patient stopped radiation because of his worsening performance status and was hospitalized for a protracted period.  Plan: The patient has completed radiation treatment. The patient will proceed to comfort care. ________________________________  Artist Pais. Kathrynn Running, M.D.

## 2011-12-25 NOTE — Progress Notes (Signed)
Subjective: The patient is seen and examined today. His wife and son are at the bedside. They have several questions today regarding his prognosis and followup care after discharge from the hospital. His wife is not interested in sending him to a skilled nursing facility with his current parenteral nutrition. He is feeling fine today with no specific complaints except for mild odynophagia and dysphagia. He denied having any significant fever or chills. He is able to eat part of his food but not a full meal.   Objective: Vital signs in last 24 hours: Temp:  [97.4 F (36.3 C)-98 F (36.7 C)] 98 F (36.7 C) (10/28 1300) Pulse Rate:  [77-92] 77  (10/28 1300) Resp:  [15-16] 16  (10/28 1300) BP: (106-116)/(63-64) 116/64 mmHg (10/28 1300) SpO2:  [97 %-99 %] 97 % (10/28 1300)  Intake/Output from previous day: 10/27 0701 - 10/28 0700 In: 7690.7 [TPN:7690.7] Out: 1675 [Urine:1675] Intake/Output this shift: Total I/O In: -  Out: 475 [Urine:475]  General appearance: alert, cooperative and no distress Resp: clear to auscultation bilaterally Cardio: regular rate and rhythm, S1, S2 normal, no murmur, click, rub or gallop GI: soft, non-tender; bowel sounds normal; no masses,  no organomegaly Extremities: extremities normal, atraumatic, no cyanosis or edema  Lab Results:   Basename 12/25/11 0600 12/23/11 0624  WBC 5.0 4.4  HGB 9.0* 8.3*  HCT 26.2* 24.4*  PLT 188 145*   BMET  Basename 12/25/11 0600 12/23/11 0624  NA 132* 132*  K 4.3 4.1  CL 95* 96  CO2 27 29  GLUCOSE 95 129*  BUN 27* 24*  CREATININE 0.92 0.92  CALCIUM 9.1 8.8    Studies/Results: No results found.  Medications: I have reviewed the patient's current medications.  Assessment/Plan: This is a very pleasant 78 years old white male with history of stage IIIB non-small cell lung cancer status post course of concurrent chemoradiation, complicated by radiation induced esophagitis with dysphagia and odynophagia. The  patient has slow improvement in his condition but he is currently on parenteral nutrition. I have a lengthy discussion with the patient and his family regarding his current care and disposition plan. I recommended for the patient to consider a PEG tube placement for nutritional. This would give the patient and his family more options regarding skilled nursing facility for rehabilitation before sending him home. They agreed to the current plan. Continue current supportive care.  LOS: 13 days    Yalexa Blust K. 12/25/2011

## 2011-12-25 NOTE — Progress Notes (Signed)
IV team called to hang TNA. TNA running at 42ml/hr per order.  Called pharmacy to clarify, rate will be continuous at 70 ml/hr until next bag change when rate will be decreased.

## 2011-12-25 NOTE — Progress Notes (Signed)
Pt voided 225cc, bladder scan preformed 135cc residual.  1 hour later pt voided 250, bladder scan preformed 166cc residual.  MD text paged results.

## 2011-12-25 NOTE — Progress Notes (Signed)
Barium provided to pt to drink with continued education regarding plan of care with PEG placement tomorrow.  Multiple lengthy conversations explaining plan of care throughout day with pt and wife. Emotional support provided for anxieties related to disposition and PEG placement.

## 2011-12-25 NOTE — Progress Notes (Signed)
Agree 

## 2011-12-25 NOTE — Progress Notes (Signed)
Clinical Social Worker continuing to follow to provide support and assist with pt discharge planning needs. Clinical Social Worker received notification from the attending MD this morning that pt wife remains opposed to pt being discharged from the hospital and was not satisfied with either of the two SNF options that would be able to manage pt on TNA. Per MD, this morning pt wife did not want to speak with Clinical Social Worker and refused to participate in discharge planning. Attending MD spoke with pt wife in great length and pt wife wanted to speak to Dr. Arbutus Ped, pt oncologist. Dr. Arbutus Ped followed by Dr. Darnelle Catalan met with pt, pt wife, and pt son at bedside this afternoon. Result of meetings was that pt and pt family made decision for pt to be weaned from TNA and have PEG tube placement. Following attending MD and oncologist discussions with the family, Clinical Social Worker was asked to follow up with pt and pt family in regard to discharge planning.   Clinical Child psychotherapist met with pt, pt wife, and pt son at bedside to discuss options for SNF placement now given pt is having PEG tube placed. Pt and pt family are agreeable to re-initiation of SNF search in [facility]. Pt wife is adamant about exploring facilities before making a decision and Clinical Social Worker encouraged pt wife to begin exploring facilities she is interested in today and explained that after PEG tube placement and toleration of tube feeds, pt will not meet medical criteria to remain in the hospital and will be discharged to SNF. Clinical Social Worker also clarified pt wife questions about potential of pt returning home with home health services if pt wife is not satisfied with any of the SNF options that are available for pt after PEG tube placement. Clinical Social Worker attempted to address pt wife questions and concerns during this time. Pt wife has this Clinical Social Worker contact information if any questions arise before  meeting with pt and pt family again. Clinical Social Worker to continue to follow and will facilitate pt discharge needs when pt medically ready for discharge.  Jacklynn Lewis, MSW, LCSWA  Clinical Social Work [phone]

## 2011-12-25 NOTE — Progress Notes (Signed)
Patient ID: Justin Pittman, male   DOB: [birth year], 78 y.o.   MRN: [id number] Request received for placement of percutaneous gastrostomy tube on pt with history of NSC lung carcinoma, radiation esophagitis, dysphagia and poor oral intake/protein calorie malnutrition. Additional PMH as below. Exam: chest- sl dim BS rt base, left clear; heart-RRR; abd- soft,+BS,NT.    Filed Vitals:   12/24/11 1355 12/24/11 2059 12/25/11 0545 12/25/11 1300  BP: 113/65 109/64 106/63 116/64  Pulse: 90 92 89 77  Temp: 98.3 F (36.8 C) 97.5 F (36.4 C) 97.4 F (36.3 C) 98 F (36.7 C)  TempSrc: Oral Oral Oral Oral  Resp: 18 16 15 16   Height:      Weight:      SpO2: 99% 98% 99% 97%   Past Medical History  Diagnosis Date  . HTN (hypertension)   . Lung cancer     Squamous Cell  . Vertigo 2010    Hospitalized a [facility]  . Hyperlipidemia   . Lung cancer    Past Surgical History  Procedure Date  . Tonsillectomy   . Video bronchoscopy 09/20/2011    Procedure: VIDEO BRONCHOSCOPY WITHOUT FLUORO;  Surgeon: Nyoka Cowden, MD;  Location: [facility];  Service: Cardiopulmonary;  Laterality: Bilateral;  . Tonsillectomy    Dg Chest 2 View  12/12/2011  *RADIOLOGY REPORT*  Clinical Data: Lung cancer, weakness post radiation.  CHEST - 2 VIEW  Comparison: 10/11/2011  Findings: Right perihilar mass is difficult to accurately measure however appears smaller in the interval.  There may be secondary necrosis/cavitation.  No new airspace opacity, pneumothorax, or pleural effusion.  Unchanged cardiomediastinal contours otherwise with aortic atherosclerotic calcification.  No acute osseous finding.  IMPRESSION: Decreased size of the right perihilar mass with question of necrosis /cavitation.  Otherwise, no acute change identified.   Original Report Authenticated By: Waneta Martins, M.D.    Dg Chest Port 1 View  12/17/2011  *RADIOLOGY REPORT*  Clinical Data: Fever and weakness.  PORTABLE CHEST - 1 VIEW  Comparison: 12/12/2011   Findings: PICC tip is at the cavoatrial junction in good position. Heart size and vascularity are normal and the lungs are clear.  No effusions.  No acute osseous abnormality.  IMPRESSION: No acute disease in the chest.   Original Report Authenticated By: Gwynn Burly, M.D.   Results for orders placed during the hospital encounter of 12/12/11  CBC WITH DIFFERENTIAL      Component Value Range   WBC 2.3 (*) 4.0 - 10.5 K/uL   RBC 3.26 (*) 4.22 - 5.81 MIL/uL   Hemoglobin 9.0 (*) 13.0 - 17.0 g/dL   HCT 78.2 (*) 95.6 - 21.3 %   MCV 78.5  78.0 - 100.0 fL   MCH 27.6  26.0 - 34.0 pg   MCHC 35.2  30.0 - 36.0 g/dL   RDW 08.6  57.8 - 46.9 %   Platelets 159  150 - 400 K/uL   Neutrophils Relative 75  43 - 77 %   Lymphocytes Relative 9 (*) 12 - 46 %   Monocytes Relative 16 (*) 3 - 12 %   Eosinophils Relative 0  0 - 5 %   Basophils Relative 0  0 - 1 %   Neutro Abs 1.7  1.7 - 7.7 K/uL   Lymphs Abs 0.2 (*) 0.7 - 4.0 K/uL   Monocytes Absolute 0.4  0.1 - 1.0 K/uL   Eosinophils Absolute 0.0  0.0 - 0.7 K/uL   Basophils Absolute 0.0  0.0 -  0.1 K/uL   Smear Review MORPHOLOGY UNREMARKABLE    BASIC METABOLIC PANEL      Component Value Range   Sodium 135  135 - 145 mEq/L   Potassium 3.3 (*) 3.5 - 5.1 mEq/L   Chloride 98  96 - 112 mEq/L   CO2 27  19 - 32 mEq/L   Glucose, Bld 104 (*) 70 - 99 mg/dL   BUN 26 (*) 6 - 23 mg/dL   Creatinine, Ser 4.54  0.50 - 1.35 mg/dL   Calcium 8.6  8.4 - 09.8 mg/dL   GFR calc non Af Amer 80 (*) >90 mL/min   GFR calc Af Amer >90  >90 mL/min  URINALYSIS, ROUTINE W REFLEX MICROSCOPIC      Component Value Range   Color, Urine YELLOW  YELLOW   APPearance CLEAR  CLEAR   Specific Gravity, Urine 1.021  1.005 - 1.030   pH 6.0  5.0 - 8.0   Glucose, UA NEGATIVE  NEGATIVE mg/dL   Hgb urine dipstick NEGATIVE  NEGATIVE   Bilirubin Urine NEGATIVE  NEGATIVE   Ketones, ur 15 (*) NEGATIVE mg/dL   Protein, ur NEGATIVE  NEGATIVE mg/dL   Urobilinogen, UA 1.0  0.0 - 1.0 mg/dL    Nitrite NEGATIVE  NEGATIVE   Leukocytes, UA NEGATIVE  NEGATIVE  BASIC METABOLIC PANEL      Component Value Range   Sodium 136  135 - 145 mEq/L   Potassium 3.7  3.5 - 5.1 mEq/L   Chloride 102  96 - 112 mEq/L   CO2 25  19 - 32 mEq/L   Glucose, Bld 76  70 - 99 mg/dL   BUN 21  6 - 23 mg/dL   Creatinine, Ser 1.19  0.50 - 1.35 mg/dL   Calcium 8.1 (*) 8.4 - 10.5 mg/dL   GFR calc non Af Amer 83 (*) >90 mL/min   GFR calc Af Amer >90  >90 mL/min  CBC      Component Value Range   WBC 2.4 (*) 4.0 - 10.5 K/uL   RBC 2.96 (*) 4.22 - 5.81 MIL/uL   Hemoglobin 8.1 (*) 13.0 - 17.0 g/dL   HCT 14.7 (*) 82.9 - 56.2 %   MCV 79.7  78.0 - 100.0 fL   MCH 27.4  26.0 - 34.0 pg   MCHC 34.3  30.0 - 36.0 g/dL   RDW 13.0  86.5 - 78.4 %   Platelets 141 (*) 150 - 400 K/uL  COMPREHENSIVE METABOLIC PANEL      Component Value Range   Sodium 134 (*) 135 - 145 mEq/L   Potassium 3.2 (*) 3.5 - 5.1 mEq/L   Chloride 101  96 - 112 mEq/L   CO2 25  19 - 32 mEq/L   Glucose, Bld 126 (*) 70 - 99 mg/dL   BUN 16  6 - 23 mg/dL   Creatinine, Ser 6.96  0.50 - 1.35 mg/dL   Calcium 7.9 (*) 8.4 - 10.5 mg/dL   Total Protein 5.1 (*) 6.0 - 8.3 g/dL   Albumin 2.6 (*) 3.5 - 5.2 g/dL   AST 10  0 - 37 U/L   ALT 8  0 - 53 U/L   Alkaline Phosphatase 80  39 - 117 U/L   Total Bilirubin 0.3  0.3 - 1.2 mg/dL   GFR calc non Af Amer 90 (*) >90 mL/min   GFR calc Af Amer >90  >90 mL/min  PREALBUMIN      Component Value Range   Prealbumin  10.8 (*) 17.0 - 34.0 mg/dL  MAGNESIUM      Component Value Range   Magnesium 1.4 (*) 1.5 - 2.5 mg/dL  PHOSPHORUS      Component Value Range   Phosphorus 1.5 (*) 2.3 - 4.6 mg/dL  CHOLESTEROL, TOTAL      Component Value Range   Cholesterol 129  0 - 200 mg/dL  TRIGLYCERIDES      Component Value Range   Triglycerides 87  <150 mg/dL  CBC      Component Value Range   WBC 2.8 (*) 4.0 - 10.5 K/uL   RBC 2.82 (*) 4.22 - 5.81 MIL/uL   Hemoglobin 7.9 (*) 13.0 - 17.0 g/dL   HCT 16.1 (*) 09.6 - 04.5 %    MCV 78.7  78.0 - 100.0 fL   MCH 28.0  26.0 - 34.0 pg   MCHC 35.6  30.0 - 36.0 g/dL   RDW 40.9 (*) 81.1 - 91.4 %   Platelets 125 (*) 150 - 400 K/uL  DIFFERENTIAL      Component Value Range   Neutrophils Relative 74  43 - 77 %   Neutro Abs 2.0  1.7 - 7.7 K/uL   Lymphocytes Relative 11 (*) 12 - 46 %   Lymphs Abs 0.3 (*) 0.7 - 4.0 K/uL   Monocytes Relative 16 (*) 3 - 12 %   Monocytes Absolute 0.4  0.1 - 1.0 K/uL   Eosinophils Relative 0  0 - 5 %   Eosinophils Absolute 0.0  0.0 - 0.7 K/uL   Basophils Relative 0  0 - 1 %   Basophils Absolute 0.0  0.0 - 0.1 K/uL  GLUCOSE, CAPILLARY      Component Value Range   Glucose-Capillary 159 (*) 70 - 99 mg/dL  GLUCOSE, CAPILLARY      Component Value Range   Glucose-Capillary 164 (*) 70 - 99 mg/dL  TYPE AND SCREEN      Component Value Range   ABO/RH(D) A POS     Antibody Screen NEG     Sample Expiration 12/17/2011     Unit Number [id number]     Blood Component Type RED CELLS,LR     Unit division 00     Status of Unit ISSUED,FINAL     Transfusion Status OK TO TRANSFUSE     Crossmatch Result Compatible    PREPARE RBC (CROSSMATCH)      Component Value Range   Order Confirmation ORDER PROCESSED BY BLOOD BANK    GLUCOSE, CAPILLARY      Component Value Range   Glucose-Capillary 138 (*) 70 - 99 mg/dL  ABO/RH      Component Value Range   ABO/RH(D) A POS    COMPREHENSIVE METABOLIC PANEL      Component Value Range   Sodium 131 (*) 135 - 145 mEq/L   Potassium 3.1 (*) 3.5 - 5.1 mEq/L   Chloride 97  96 - 112 mEq/L   CO2 27  19 - 32 mEq/L   Glucose, Bld 111 (*) 70 - 99 mg/dL   BUN 11  6 - 23 mg/dL   Creatinine, Ser 7.84  0.50 - 1.35 mg/dL   Calcium 7.8 (*) 8.4 - 10.5 mg/dL   Total Protein 5.0 (*) 6.0 - 8.3 g/dL   Albumin 2.5 (*) 3.5 - 5.2 g/dL   AST 12  0 - 37 U/L   ALT 7  0 - 53 U/L   Alkaline Phosphatase 77  39 - 117 U/L   Total Bilirubin  0.6  0.3 - 1.2 mg/dL   GFR calc non Af Amer >90  >90 mL/min   GFR calc Af Amer >90  >90 mL/min    MAGNESIUM      Component Value Range   Magnesium 1.4 (*) 1.5 - 2.5 mg/dL  PHOSPHORUS      Component Value Range   Phosphorus 2.3  2.3 - 4.6 mg/dL  CBC      Component Value Range   WBC 2.4 (*) 4.0 - 10.5 K/uL   RBC 3.10 (*) 4.22 - 5.81 MIL/uL   Hemoglobin 8.7 (*) 13.0 - 17.0 g/dL   HCT 76.2 (*) 83.1 - 51.7 %   MCV 78.1  78.0 - 100.0 fL   MCH 28.1  26.0 - 34.0 pg   MCHC 36.0  30.0 - 36.0 g/dL   RDW 61.6 (*) 07.3 - 71.0 %   Platelets 118 (*) 150 - 400 K/uL  GLUCOSE, CAPILLARY      Component Value Range   Glucose-Capillary 143 (*) 70 - 99 mg/dL  GLUCOSE, CAPILLARY      Component Value Range   Glucose-Capillary 114 (*) 70 - 99 mg/dL  GLUCOSE, CAPILLARY      Component Value Range   Glucose-Capillary 121 (*) 70 - 99 mg/dL  GLUCOSE, CAPILLARY      Component Value Range   Glucose-Capillary 115 (*) 70 - 99 mg/dL  CBC      Component Value Range   WBC 2.7 (*) 4.0 - 10.5 K/uL   RBC 3.38 (*) 4.22 - 5.81 MIL/uL   Hemoglobin 9.4 (*) 13.0 - 17.0 g/dL   HCT 62.6 (*) 94.8 - 54.6 %   MCV 78.4  78.0 - 100.0 fL   MCH 27.8  26.0 - 34.0 pg   MCHC 35.5  30.0 - 36.0 g/dL   RDW 27.0 (*) 35.0 - 09.3 %   Platelets 130 (*) 150 - 400 K/uL  BASIC METABOLIC PANEL      Component Value Range   Sodium 131 (*) 135 - 145 mEq/L   Potassium 3.4 (*) 3.5 - 5.1 mEq/L   Chloride 96  96 - 112 mEq/L   CO2 28  19 - 32 mEq/L   Glucose, Bld 116 (*) 70 - 99 mg/dL   BUN 12  6 - 23 mg/dL   Creatinine, Ser 8.18  0.50 - 1.35 mg/dL   Calcium 8.1 (*) 8.4 - 10.5 mg/dL   GFR calc non Af Amer 89 (*) >90 mL/min   GFR calc Af Amer >90  >90 mL/min  GLUCOSE, CAPILLARY      Component Value Range   Glucose-Capillary 118 (*) 70 - 99 mg/dL   Comment 1 Notify RN     Comment 2 Documented in Chart    GLUCOSE, CAPILLARY      Component Value Range   Glucose-Capillary 122 (*) 70 - 99 mg/dL   Comment 1 Documented in Chart     Comment 2 Notify RN    GLUCOSE, CAPILLARY      Component Value Range   Glucose-Capillary 118 (*) 70  - 99 mg/dL   Comment 1 Documented in Chart     Comment 2 Notify RN    CBC      Component Value Range   WBC 2.2 (*) 4.0 - 10.5 K/uL   RBC 3.08 (*) 4.22 - 5.81 MIL/uL   Hemoglobin 8.5 (*) 13.0 - 17.0 g/dL   HCT 29.9 (*) 37.1 - 69.6 %   MCV 82.1  78.0 -  100.0 fL   MCH 27.6  26.0 - 34.0 pg   MCHC 33.6  30.0 - 36.0 g/dL   RDW 86.5 (*) 78.4 - 69.6 %   Platelets 125 (*) 150 - 400 K/uL  GLUCOSE, CAPILLARY      Component Value Range   Glucose-Capillary 126 (*) 70 - 99 mg/dL   Comment 1 Notify RN    BASIC METABOLIC PANEL      Component Value Range   Sodium 129 (*) 135 - 145 mEq/L   Potassium 4.0  3.5 - 5.1 mEq/L   Chloride 95 (*) 96 - 112 mEq/L   CO2 26  19 - 32 mEq/L   Glucose, Bld 107 (*) 70 - 99 mg/dL   BUN 15  6 - 23 mg/dL   Creatinine, Ser 2.95  0.50 - 1.35 mg/dL   Calcium 8.3 (*) 8.4 - 10.5 mg/dL   GFR calc non Af Amer 84 (*) >90 mL/min   GFR calc Af Amer >90  >90 mL/min  MAGNESIUM      Component Value Range   Magnesium 1.5  1.5 - 2.5 mg/dL  GLUCOSE, CAPILLARY      Component Value Range   Glucose-Capillary 130 (*) 70 - 99 mg/dL   Comment 1 Documented in Chart     Comment 2 Notify RN    GLUCOSE, CAPILLARY      Component Value Range   Glucose-Capillary 104 (*) 70 - 99 mg/dL   Comment 1 Documented in Chart     Comment 2 Notify RN    COMPREHENSIVE METABOLIC PANEL      Component Value Range   Sodium 130 (*) 135 - 145 mEq/L   Potassium 3.7  3.5 - 5.1 mEq/L   Chloride 97  96 - 112 mEq/L   CO2 26  19 - 32 mEq/L   Glucose, Bld 106 (*) 70 - 99 mg/dL   BUN 20  6 - 23 mg/dL   Creatinine, Ser 2.84  0.50 - 1.35 mg/dL   Calcium 8.2 (*) 8.4 - 10.5 mg/dL   Total Protein 4.9 (*) 6.0 - 8.3 g/dL   Albumin 2.3 (*) 3.5 - 5.2 g/dL   AST 13  0 - 37 U/L   ALT 8  0 - 53 U/L   Alkaline Phosphatase 71  39 - 117 U/L   Total Bilirubin 0.5  0.3 - 1.2 mg/dL   GFR calc non Af Amer 79 (*) >90 mL/min   GFR calc Af Amer >90  >90 mL/min  MAGNESIUM      Component Value Range   Magnesium 1.8  1.5  - 2.5 mg/dL  PHOSPHORUS      Component Value Range   Phosphorus 4.5  2.3 - 4.6 mg/dL  CBC      Component Value Range   WBC 3.8 (*) 4.0 - 10.5 K/uL   RBC 2.98 (*) 4.22 - 5.81 MIL/uL   Hemoglobin 8.2 (*) 13.0 - 17.0 g/dL   HCT 13.2 (*) 44.0 - 10.2 %   MCV 79.2  78.0 - 100.0 fL   MCH 27.5  26.0 - 34.0 pg   MCHC 34.7  30.0 - 36.0 g/dL   RDW 72.5 (*) 36.6 - 44.0 %   Platelets 126 (*) 150 - 400 K/uL  DIFFERENTIAL      Component Value Range   Neutrophils Relative 68  43 - 77 %   Lymphocytes Relative 9 (*) 12 - 46 %   Monocytes Relative 23 (*) 3 - 12 %  Eosinophils Relative 0  0 - 5 %   Basophils Relative 0  0 - 1 %   Neutro Abs 2.6  1.7 - 7.7 K/uL   Lymphs Abs 0.3 (*) 0.7 - 4.0 K/uL   Monocytes Absolute 0.9  0.1 - 1.0 K/uL   Eosinophils Absolute 0.0  0.0 - 0.7 K/uL   Basophils Absolute 0.0  0.0 - 0.1 K/uL   WBC Morphology MILD LEFT SHIFT (1-5% METAS, OCC MYELO, OCC BANDS)    CHOLESTEROL, TOTAL      Component Value Range   Cholesterol 79  0 - 200 mg/dL  TRIGLYCERIDES      Component Value Range   Triglycerides 32  <150 mg/dL  PREALBUMIN      Component Value Range   Prealbumin 5.6 (*) 17.0 - 34.0 mg/dL  CULTURE, BLOOD (ROUTINE X 2)      Component Value Range   Specimen Description BLOOD LEFT ARM  10 ML IN [facility] BOTTLE     Special Requests Immunocompromised     Culture  Setup Time 12/18/2011 10:16     Culture NO GROWTH 5 DAYS     Report Status 12/24/2011 FINAL    CULTURE, BLOOD (ROUTINE X 2)      Component Value Range   Specimen Description BLOOD LEFT ARM  10 ML IN [facility] BOTTLE     Special Requests Immunocompromised     Culture  Setup Time 12/18/2011 10:16     Culture NO GROWTH 5 DAYS     Report Status 12/24/2011 FINAL    URINE CULTURE      Component Value Range   Specimen Description URINE, CLEAN CATCH     Special Requests Immunocompromised     Culture  Setup Time 12/17/2011 23:02     Colony Count NO GROWTH     Culture NO GROWTH     Report Status 12/18/2011 FINAL      URINALYSIS, ROUTINE W REFLEX MICROSCOPIC      Component Value Range   Color, Urine YELLOW  YELLOW   APPearance CLEAR  CLEAR   Specific Gravity, Urine 1.011  1.005 - 1.030   pH 7.5  5.0 - 8.0   Glucose, UA NEGATIVE  NEGATIVE mg/dL   Hgb urine dipstick NEGATIVE  NEGATIVE   Bilirubin Urine NEGATIVE  NEGATIVE   Ketones, ur NEGATIVE  NEGATIVE mg/dL   Protein, ur NEGATIVE  NEGATIVE mg/dL   Urobilinogen, UA 2.0 (*) 0.0 - 1.0 mg/dL   Nitrite NEGATIVE  NEGATIVE   Leukocytes, UA NEGATIVE  NEGATIVE  GLUCOSE, CAPILLARY      Component Value Range   Glucose-Capillary 136 (*) 70 - 99 mg/dL   Comment 1 Notify RN    LACTIC ACID, PLASMA      Component Value Range   Lactic Acid, Venous 1.4  0.5 - 2.2 mmol/L  PROCALCITONIN      Component Value Range   Procalcitonin 0.57    GLUCOSE, CAPILLARY      Component Value Range   Glucose-Capillary 143 (*) 70 - 99 mg/dL  GLUCOSE, CAPILLARY      Component Value Range   Glucose-Capillary 120 (*) 70 - 99 mg/dL  GLUCOSE, CAPILLARY      Component Value Range   Glucose-Capillary 131 (*) 70 - 99 mg/dL  BASIC METABOLIC PANEL      Component Value Range   Sodium 135  135 - 145 mEq/L   Potassium 4.3  3.5 - 5.1 mEq/L   Chloride 100  96 - 112 mEq/L  CO2 27  19 - 32 mEq/L   Glucose, Bld 127 (*) 70 - 99 mg/dL   BUN 18  6 - 23 mg/dL   Creatinine, Ser 7.82  0.50 - 1.35 mg/dL   Calcium 7.2 (*) 8.4 - 10.5 mg/dL   GFR calc non Af Amer 80 (*) >90 mL/min   GFR calc Af Amer >90  >90 mL/min  CBC      Component Value Range   WBC 4.0  4.0 - 10.5 K/uL   RBC 3.12 (*) 4.22 - 5.81 MIL/uL   Hemoglobin 8.5 (*) 13.0 - 17.0 g/dL   HCT 95.6 (*) 21.3 - 08.6 %   MCV 79.8  78.0 - 100.0 fL   MCH 27.2  26.0 - 34.0 pg   MCHC 34.1  30.0 - 36.0 g/dL   RDW 57.8 (*) 46.9 - 62.9 %   Platelets 130 (*) 150 - 400 K/uL  GLUCOSE, CAPILLARY      Component Value Range   Glucose-Capillary 141 (*) 70 - 99 mg/dL  GLUCOSE, CAPILLARY      Component Value Range   Glucose-Capillary 142 (*) 70  - 99 mg/dL   Comment 1 Documented in Chart     Comment 2 Notify RN    GLUCOSE, CAPILLARY      Component Value Range   Glucose-Capillary 139 (*) 70 - 99 mg/dL   Comment 1 Documented in Chart     Comment 2 Notify RN    GLUCOSE, CAPILLARY      Component Value Range   Glucose-Capillary 142 (*) 70 - 99 mg/dL  GLUCOSE, CAPILLARY      Component Value Range   Glucose-Capillary 141 (*) 70 - 99 mg/dL  GLUCOSE, CAPILLARY      Component Value Range   Glucose-Capillary 132 (*) 70 - 99 mg/dL   Comment 1 Notify RN     Comment 2 Documented in Chart    COMPREHENSIVE METABOLIC PANEL      Component Value Range   Sodium 131 (*) 135 - 145 mEq/L   Potassium 4.1  3.5 - 5.1 mEq/L   Chloride 97  96 - 112 mEq/L   CO2 27  19 - 32 mEq/L   Glucose, Bld 128 (*) 70 - 99 mg/dL   BUN 20  6 - 23 mg/dL   Creatinine, Ser 5.28  0.50 - 1.35 mg/dL   Calcium 8.5  8.4 - 41.3 mg/dL   Total Protein 5.5 (*) 6.0 - 8.3 g/dL   Albumin 2.3 (*) 3.5 - 5.2 g/dL   AST 19  0 - 37 U/L   ALT 14  0 - 53 U/L   Alkaline Phosphatase 84  39 - 117 U/L   Total Bilirubin 0.2 (*) 0.3 - 1.2 mg/dL   GFR calc non Af Amer 81 (*) >90 mL/min   GFR calc Af Amer >90  >90 mL/min  MAGNESIUM      Component Value Range   Magnesium 1.4 (*) 1.5 - 2.5 mg/dL  PHOSPHORUS      Component Value Range   Phosphorus 3.6  2.3 - 4.6 mg/dL  GLUCOSE, CAPILLARY      Component Value Range   Glucose-Capillary 126 (*) 70 - 99 mg/dL  GLUCOSE, CAPILLARY      Component Value Range   Glucose-Capillary 127 (*) 70 - 99 mg/dL   Comment 1 Documented in Chart     Comment 2 Notify RN    GLUCOSE, CAPILLARY      Component Value Range  Glucose-Capillary 138 (*) 70 - 99 mg/dL   Comment 1 Documented in Chart     Comment 2 Notify RN    GLUCOSE, CAPILLARY      Component Value Range   Glucose-Capillary 126 (*) 70 - 99 mg/dL   Comment 1 Notify RN    GLUCOSE, CAPILLARY      Component Value Range   Glucose-Capillary 153 (*) 70 - 99 mg/dL   Comment 1 Documented in  Chart     Comment 2 Notify RN    GLUCOSE, CAPILLARY      Component Value Range   Glucose-Capillary 144 (*) 70 - 99 mg/dL   Comment 1 Documented in Chart     Comment 2 Notify RN    BASIC METABOLIC PANEL      Component Value Range   Sodium 132 (*) 135 - 145 mEq/L   Potassium 4.1  3.5 - 5.1 mEq/L   Chloride 96  96 - 112 mEq/L   CO2 29  19 - 32 mEq/L   Glucose, Bld 129 (*) 70 - 99 mg/dL   BUN 24 (*) 6 - 23 mg/dL   Creatinine, Ser 9.60  0.50 - 1.35 mg/dL   Calcium 8.8  8.4 - 45.4 mg/dL   GFR calc non Af Amer 79 (*) >90 mL/min   GFR calc Af Amer >90  >90 mL/min  CBC      Component Value Range   WBC 4.4  4.0 - 10.5 K/uL   RBC 3.00 (*) 4.22 - 5.81 MIL/uL   Hemoglobin 8.3 (*) 13.0 - 17.0 g/dL   HCT 09.8 (*) 11.9 - 14.7 %   MCV 81.3  78.0 - 100.0 fL   MCH 27.7  26.0 - 34.0 pg   MCHC 34.0  30.0 - 36.0 g/dL   RDW 82.9 (*) 56.2 - 13.0 %   Platelets 145 (*) 150 - 400 K/uL  MAGNESIUM      Component Value Range   Magnesium 1.7  1.5 - 2.5 mg/dL  GLUCOSE, CAPILLARY      Component Value Range   Glucose-Capillary 134 (*) 70 - 99 mg/dL   Comment 1 Notify RN    GLUCOSE, CAPILLARY      Component Value Range   Glucose-Capillary 129 (*) 70 - 99 mg/dL   Comment 1 Documented in Chart     Comment 2 Notify RN    GLUCOSE, CAPILLARY      Component Value Range   Glucose-Capillary 143 (*) 70 - 99 mg/dL   Comment 1 Documented in Chart     Comment 2 Notify RN    GLUCOSE, CAPILLARY      Component Value Range   Glucose-Capillary 131 (*) 70 - 99 mg/dL   Comment 1 Notify RN    GLUCOSE, CAPILLARY      Component Value Range   Glucose-Capillary 149 (*) 70 - 99 mg/dL   Comment 1 Documented in Chart     Comment 2 Notify RN    COMPREHENSIVE METABOLIC PANEL      Component Value Range   Sodium 132 (*) 135 - 145 mEq/L   Potassium 4.3  3.5 - 5.1 mEq/L   Chloride 95 (*) 96 - 112 mEq/L   CO2 27  19 - 32 mEq/L   Glucose, Bld 95  70 - 99 mg/dL   BUN 27 (*) 6 - 23 mg/dL   Creatinine, Ser 8.65  0.50 - 1.35  mg/dL   Calcium 9.1  8.4 - 78.4 mg/dL  Total Protein 6.2  6.0 - 8.3 g/dL   Albumin 2.7 (*) 3.5 - 5.2 g/dL   AST 39 (*) 0 - 37 U/L   ALT 53  0 - 53 U/L   Alkaline Phosphatase 135 (*) 39 - 117 U/L   Total Bilirubin 0.6  0.3 - 1.2 mg/dL   GFR calc non Af Amer 79 (*) >90 mL/min   GFR calc Af Amer >90  >90 mL/min  MAGNESIUM      Component Value Range   Magnesium 1.6  1.5 - 2.5 mg/dL  PHOSPHORUS      Component Value Range   Phosphorus 4.2  2.3 - 4.6 mg/dL  CBC      Component Value Range   WBC 5.0  4.0 - 10.5 K/uL   RBC 3.26 (*) 4.22 - 5.81 MIL/uL   Hemoglobin 9.0 (*) 13.0 - 17.0 g/dL   HCT 44.0 (*) 10.2 - 72.5 %   MCV 80.4  78.0 - 100.0 fL   MCH 27.6  26.0 - 34.0 pg   MCHC 34.4  30.0 - 36.0 g/dL   RDW 36.6 (*) 44.0 - 34.7 %   Platelets 188  150 - 400 K/uL  DIFFERENTIAL      Component Value Range   Neutrophils Relative 73  43 - 77 %   Neutro Abs 3.7  1.7 - 7.7 K/uL   Lymphocytes Relative 14  12 - 46 %   Lymphs Abs 0.7  0.7 - 4.0 K/uL   Monocytes Relative 12  3 - 12 %   Monocytes Absolute 0.6  0.1 - 1.0 K/uL   Eosinophils Relative 0  0 - 5 %   Eosinophils Absolute 0.0  0.0 - 0.7 K/uL   Basophils Relative 0  0 - 1 %   Basophils Absolute 0.0  0.0 - 0.1 K/uL  CHOLESTEROL, TOTAL      Component Value Range   Cholesterol 96  0 - 200 mg/dL  TRIGLYCERIDES      Component Value Range   Triglycerides 112  <150 mg/dL  PREALBUMIN      Component Value Range   Prealbumin 13.0 (*) 17.0 - 34.0 mg/dL  GLUCOSE, CAPILLARY      Component Value Range   Glucose-Capillary 98  70 - 99 mg/dL   Comment 1 Notify RN    GLUCOSE, CAPILLARY      Component Value Range   Glucose-Capillary 101 (*) 70 - 99 mg/dL  GLUCOSE, CAPILLARY      Component Value Range   Glucose-Capillary 126 (*) 70 - 99 mg/dL   Comment 1 Documented in Chart     Comment 2 Notify RN     Comment 3 Orig Pt Id entered as [id number]    GLUCOSE, CAPILLARY      Component Value Range   Glucose-Capillary 148 (*) 70 - 99 mg/dL    Comment 1 Notify RN     A/P: Pt with hx NSC lung carcinoma, radiation esophagitis, dysphagia, poor oral intake/protein calorie malnutrition. Plan is tent for placement of percutaneous gastrostomy tube on 10/29. Details/risks of procedure d/w pt/wife with their understanding and consent.

## 2011-12-25 NOTE — Progress Notes (Signed)
PT Cancellation Note  Patient Details Name: Justin Pittman MRN: [id number] DOB: 12/09/1932   Cancelled Treatment:    Reason Eval/Treat Not Completed: Other (comment) (pt just finished eating this am, in md visit this pm)   Donnetta Hail 12/25/2011, 2:26 PM

## 2011-12-25 NOTE — Progress Notes (Signed)
PARENTERAL NUTRITION CONSULT NOTE - FOLLOW UP  Pharmacy Consult for TNA  Indication: Poor oral intake 2/2 radiation esophagitis  No Known Allergies  Patient Measurements: Height: 5\' 10"  (177.8 cm) Weight: 135 lb 12.8 oz (61.598 kg) IBW/kg (Calculated) : 73  Usual Weight: 71 kg   Vital Signs: Temp: 97.4 F (36.3 C) (10/28 0545) Temp src: Oral (10/28 0545) BP: 106/63 mmHg (10/28 0545) Pulse Rate: 89  (10/28 0545) Intake/Output from previous day: 10/27 0701 - 10/28 0700 In: 7690.7 [TPN:7690.7] Out: 1675 [Urine:1675] Intake/Output from this shift:    Labs:  Basename 12/25/11 0600 12/23/11 0624  WBC 5.0 4.4  HGB 9.0* 8.3*  HCT 26.2* 24.4*  PLT 188 145*  APTT -- --  INR -- --     Basename 12/25/11 0600 12/23/11 0624  NA 132* 132*  K 4.3 4.1  CL 95* 96  CO2 27 29  GLUCOSE 95 129*  BUN 27* 24*  CREATININE 0.92 0.92  LABCREA -- --  CREAT24HRUR -- --  CALCIUM 9.1 8.8  MG 1.6 1.7  PHOS 4.2 --  PROT 6.2 --  ALBUMIN 2.7* --  AST 39* --  ALT 53 --  ALKPHOS 135* --  BILITOT 0.6 --  BILIDIR -- --  IBILI -- --  PREALBUMIN -- --  TRIG 112 --  CHOLHDL -- --  CHOL 96 --   Estimated Creatinine Clearance: 58.6 ml/min (by C-G formula based on Cr of 0.92).   Insulin Requirements in the past 24 hours:  CBGs 98-149 Required 4 units moderate SSI Q8h  Nutritional Goals RD rec 10/16: Kcal:1950-2300, Protein: 85-100g, Fluid:1.9-2.3L Clinimix E5/20 at a goal rate of 80 ml/hr with lipids MWF will provide 96 g protein daily and 1689 kcal TTSS, 2169 kcal MWF, and an average of 1896 kcal/day.   Current Nutrition TNA inadvertently weaned off 10/27 Regular, dysphagia 3 diet - 0 mL PO intake recorded yesterday  Asssessment: 50 yom with h/o stage IIIB NSCLC s/p concurrent chemoradiation for 6 weeks (carboplatin/paclitaxel) with last doses given 12/05/11. Patient presented 12/12/2011 for intractable nausea and vomiting associated with dysphagia and thrush secondary to radiation  induced esophagitis. TNA started 10/16 for poor oral intake and protein calorie malnutrition. Chemo and radiation currently on hold until improvement in condition. Spouse stated no further Rad/Onc for patient.  Planning SNF placement for ongoing nutritional support.  Labs: Renal function: Scr stable/wnl Hepatic function: all wnl Electrolytes: Na+ remains low (cannot be adjusted in TNA), Mag wnl (10/28), Phos wnl 10/28 on KPhos 250mg  PO daily Pre-Albumin: 10.8 (10/17), 5.6 (10/21), pend (10/28) TG/Cholesterol: wnl 10/17, 10/21, 10/28  Plan:    Restart Clinimix E5/20 @ 70 ml/hr this AM.  At 1800 begin new bag of Clinimix E5/20 @ 60 ml/hr. Will wean by 10 ml/hr/day per MD order  TNA to contain IV fat emulsion, standard multivitamins and trace elements only on MWF only due to ongoing shortage  Continue SSI q8h  TNA labs Monday/Thursdays  Pharmacy will follow up daily  Tammy Sours, Pharm.D. Clinical Oncology Pharmacist  Pager # [phone]  12/25/2011 10:10 AM

## 2011-12-26 ENCOUNTER — Ambulatory Visit: Payer: Medicare Other

## 2011-12-26 ENCOUNTER — Inpatient Hospital Stay (HOSPITAL_COMMUNITY): Payer: Medicare Other

## 2011-12-26 LAB — APTT: aPTT: 31 seconds (ref 24–37)

## 2011-12-26 LAB — GLUCOSE, CAPILLARY
Glucose-Capillary: 136 mg/dL — ABNORMAL HIGH (ref 70–99)
Glucose-Capillary: 142 mg/dL — ABNORMAL HIGH (ref 70–99)
Glucose-Capillary: 142 mg/dL — ABNORMAL HIGH (ref 70–99)

## 2011-12-26 LAB — CBC
Platelets: 157 10*3/uL (ref 150–400)
RBC: 2.92 MIL/uL — ABNORMAL LOW (ref 4.22–5.81)
RDW: 16.9 % — ABNORMAL HIGH (ref 11.5–15.5)
WBC: 3.9 10*3/uL — ABNORMAL LOW (ref 4.0–10.5)

## 2011-12-26 LAB — BASIC METABOLIC PANEL
Calcium: 8.7 mg/dL (ref 8.4–10.5)
Creatinine, Ser: 0.92 mg/dL (ref 0.50–1.35)
GFR calc non Af Amer: 79 mL/min — ABNORMAL LOW (ref >90)
Sodium: 132 mEq/L — ABNORMAL LOW (ref 135–145)

## 2011-12-26 LAB — PROTIME-INR
INR: 1.02 (ref 0.00–1.49)
Prothrombin Time: 13.3 seconds (ref 11.6–15.2)

## 2011-12-26 MED ORDER — LIDOCAINE HCL 1 % IJ SOLN
INTRAMUSCULAR | Status: AC
  Filled 2011-12-26: qty 20

## 2011-12-26 MED ORDER — CEFAZOLIN SODIUM 1-5 GM-% IV SOLN
INTRAVENOUS | Status: AC
  Filled 2011-12-26: qty 50

## 2011-12-26 MED ORDER — FENTANYL CITRATE 0.05 MG/ML IJ SOLN
INTRAMUSCULAR | Status: AC | PRN
  Administered 2011-12-26 (×2): 50 ug via INTRAVENOUS

## 2011-12-26 MED ORDER — MIDAZOLAM HCL 2 MG/2ML IJ SOLN
INTRAMUSCULAR | Status: AC | PRN
  Administered 2011-12-26: 1 mg via INTRAVENOUS

## 2011-12-26 MED ORDER — FENTANYL CITRATE 0.05 MG/ML IJ SOLN
INTRAMUSCULAR | Status: AC
  Filled 2011-12-26: qty 4

## 2011-12-26 MED ORDER — CLINIMIX E/DEXTROSE (5/20) 5 % IV SOLN
INTRAVENOUS | Status: AC
  Administered 2011-12-26: 18:00:00 via INTRAVENOUS
  Filled 2011-12-26: qty 2000

## 2011-12-26 MED ORDER — CEFAZOLIN SODIUM 1-5 GM-% IV SOLN
1.0000 g | Freq: Once | INTRAVENOUS | Status: AC
  Administered 2011-12-26: 1 g via INTRAVENOUS
  Filled 2011-12-26: qty 50

## 2011-12-26 MED ORDER — MIDAZOLAM HCL 2 MG/2ML IJ SOLN
INTRAMUSCULAR | Status: AC
  Filled 2011-12-26: qty 4

## 2011-12-26 MED ORDER — GLUCAGON HCL (RDNA) 1 MG IJ SOLR
INTRAMUSCULAR | Status: AC
  Filled 2011-12-26: qty 1

## 2011-12-26 MED ORDER — IOHEXOL 300 MG/ML  SOLN: 20.0000 mL | Freq: Once | INTRAMUSCULAR | Status: AC | PRN

## 2011-12-26 NOTE — Progress Notes (Signed)
Physical Therapy Treatment Patient Details Name: Justin Pittman MRN: [id number] DOB: 12/26/1932 Today's Date: 12/26/2011 Time: 0945-1000 PT Time Calculation (min): 15 min  PT Assessment / Plan / Recommendation Comments on Treatment Session  pt appears to  be a little weaker today, bothered by pain, but is able to walk with RW with encouragement. ( I spoke with Dr. Darnelle Catalan prior to returning to pt's room today to again encourage ambulation)  Pt will need continued PT for strengthening and encouragement of functional independence    Follow Up Recommendations        Does the patient have the potential to tolerate intense rehabilitation  No, Recommend SNF  Barriers to Discharge        Equipment Recommendations       Recommendations for Other Services    Frequency     Plan Discharge plan remains appropriate;Frequency remains appropriate    Precautions / Restrictions Restrictions Weight Bearing Restrictions: No   Pertinent Vitals/Pain Pt c/o 4-5/10 pain in chest that increases with activity    Mobility  Bed Mobility Bed Mobility: Supine to Sit;Sit to Supine Supine to Sit: 5: Supervision Sit to Supine: 5: Supervision Details for Bed Mobility Assistance: moves slowly, needs encouragement Transfers Transfers: Sit to Stand;Stand to Sit Sit to Stand: 4: Min guard;With upper extremity assist;From bed Stand to Sit: With upper extremity assist;To bed;4: Min guard Details for Transfer Assistance: pt needs assist when he first stands up. Pt had decreased tolerance to upright and needed to sit down and rest for a few minutes before he could advance to walking Ambulation/Gait Ambulation/Gait Assistance: 4: Min guard Ambulation Distance (Feet): 150 Feet Assistive device: Rolling walker Ambulation/Gait Assistance Details: pt with balance loss with distraction and changing direction even with use of RW. Pt able to self correct Gait Pattern: Within Functional Limits Gait velocity:  decreased General Gait Details: gait is improved with RW. Pt needs encouragement Stairs: No Wheelchair Mobility Wheelchair Mobility: No    Exercises Other Exercises Other Exercises: standing isometric glutes and trunk extension Other Exercises: sitting bilateral hip to hip to activate core Other Exercises: ankel pumps and quad sets.  Wife acknowledges she can help her husband with these   PT Diagnosis:    PT Problem List:   PT Treatment Interventions:     PT Goals Acute Rehab PT Goals PT Goal Formulation: With patient Time For Goal Achievement: 12/28/11 Potential to Achieve Goals: Good Pt will go Supine/Side to Sit: with supervision PT Goal: Supine/Side to Sit - Progress: Met Pt will go Sit to Stand: with supervision PT Goal: Sit to Stand - Progress: Met Pt will go Stand to Sit: with supervision PT Goal: Stand to Sit - Progress: Progressing toward goal Pt will Ambulate: with rolling walker;>150 feet;with supervision PT Goal: Ambulate - Progress: Progressing toward goal  Visit Information  Last PT Received On: 12/26/11 Reason Eval/Treat Not Completed: Other (comment) (pt and wife defer; waiting to go for PEG)    Subjective Data  Subjective: "I'm about half" Patient Stated Goal: to get PEG today   Cognition  Overall Cognitive Status: Appears within functional limits for tasks assessed/performed Arousal/Alertness: Awake/alert Orientation Level: Appears intact for tasks assessed Behavior During Session: Flat affect    Balance  Balance Balance Assessed: Yes Static Sitting Balance Static Sitting - Balance Support: No upper extremity supported Static Sitting - Comment/# of Minutes: pt needs to sit for several minutes before he is able to advance gait Static Standing Balance Static Standing - Balance Support:  No upper extremity supported;Bilateral upper extremity supported;During functional activity Static Standing - Level of Assistance: 5: Stand by assistance  End of  Session PT - End of Session Activity Tolerance: Patient limited by fatigue Patient left: in bed Nurse Communication: Mobility status   GP     Justin Pittman 12/26/2011, 10:34 AM

## 2011-12-26 NOTE — Progress Notes (Signed)
PARENTERAL NUTRITION CONSULT NOTE - FOLLOW UP  Pharmacy Consult for TNA  Indication: Poor oral intake 2/2 radiation esophagitis  No Known Allergies  Patient Measurements: Height: 5\' 10"  (177.8 cm) Weight: 135 lb 12.8 oz (61.598 kg) IBW/kg (Calculated) : 73  Usual Weight: 71 kg   Vital Signs: Temp: 97.1 F (36.2 C) (10/29 0451) Temp src: Oral (10/29 0451) BP: 112/57 mmHg (10/29 0451) Pulse Rate: 89  (10/29 0451) Intake/Output from previous day: 10/28 0701 - 10/29 0700 In: 300 [P.O.:300] Out: 1075 [Urine:1075] Intake/Output from this shift:    Labs:  Basename 12/26/11 0615 12/25/11 0600  WBC 3.9* 5.0  HGB 8.1* 9.0*  HCT 23.6* 26.2*  PLT 157 188  APTT 31 --  INR 1.02 --     Basename 12/26/11 0615 12/25/11 0600  NA 132* 132*  K 3.7 4.3  CL 95* 95*  CO2 29 27  GLUCOSE 128* 95  BUN 24* 27*  CREATININE 0.92 0.92  LABCREA -- --  CREAT24HRUR -- --  CALCIUM 8.7 9.1  MG -- 1.6  PHOS -- 4.2  PROT -- 6.2  ALBUMIN -- 2.7*  AST -- 39*  ALT -- 53  ALKPHOS -- 135*  BILITOT -- 0.6  BILIDIR -- --  IBILI -- --  PREALBUMIN -- 13.0*  TRIG -- 112  CHOLHDL -- --  CHOL -- 96   Estimated Creatinine Clearance: 58.6 ml/min (by C-G formula based on Cr of 0.92).   Insulin Requirements in the past 24 hours:  CBGs 101-148 Required 4 units moderate SSI Q8h  Nutritional Goals RD rec 10/16:1950-2300 kcal, 85-100g protein Clinimix E5/20 at a goal rate of 80 ml/hr with lipids MWF will provide 96 g protein daily and 1689 kcal TTSS, 2169 kcal MWF, and an average of 1896 kcal/day.   Current Nutrition TNA inadvertently weaned off 10/27. Intent was to wean by 10cc/hr. TNA resumed 10/28 am @ 31ml/hr then decreased to 28ml/hr 10/28 pm Regular, dysphagia 3 diet - 0 mL PO intake recorded yesterday  Asssessment: 66 yom with h/o stage IIIB NSCLC s/p concurrent chemoradiation for 6 weeks (carboplatin/paclitaxel) with last doses given 12/05/11. Patient presented 12/12/2011 for intractable  nausea and vomiting associated with dysphagia and thrush secondary to radiation induced esophagitis. TNA started 10/16 for poor oral intake and protein calorie malnutrition. Chemo and radiation currently on hold until improvement in condition. Spouse stated no further Rad/Onc for patient. PEG tube to be placed today.  Labs: Renal function: Scr stable/wnl Hepatic function: AST slightly above ULN - ALT/TBili wnl (10/28) Electrolytes: Na+ remains low (cannot be adjusted in TNA), Mag wnl (10/28), Phos wnl 10/28 on KPhos 250mg  PO daily Pre-Albumin: 10.8 (10/17), 5.6 (10/21), 13 (10/28) TG/Cholesterol: wnl 10/17, 10/21, 10/28  Plan:    At 1800 decrease Clinimix E5/20 to 50 ml/hr. Will wean by 10 ml/hr/day per MD order  TNA to contain IV fat emulsion, standard multivitamins and trace elements only on MWF only due to ongoing shortage  Continue SSI q8h  TNA labs Monday/Thursdays  Pharmacy will follow up daily  Gwen Her PharmD  [phone] 12/26/2011 7:17 AM

## 2011-12-26 NOTE — Progress Notes (Signed)
PT Cancellation Note  Patient Details Name: Slayter Moorhouse MRN: [id number] DOB: 1932/06/19   Cancelled Treatment:    Reason Eval/Treat Not Completed: Other (comment) (pt and wife defer; waiting to go for PEG)   Donnetta Hail 12/26/2011, 8:59 AM

## 2011-12-26 NOTE — Procedures (Signed)
Procedure:  Gastrostomy tube placement Findings:  20 Fr bumper retention gastrostomy tube placed in body of stomach.  OK to use in 24 hours.

## 2011-12-26 NOTE — Progress Notes (Signed)
[facility] PROGRESS NOTE  Justin Pittman ZOX:[id number] DOB: 1932-11-09 DOA: 12/12/2011 PCP: [name], MD  Brief narrative: Justin Pittman is a 78 year old male with a PMH of stage IIIB non-small cell lung cancer, squamous cell carcinoma with right lower lobe lung mass as well as left hilar lymphadenopathy (diagnosed in August of 2013) who had been receiving chemoradiation with weekly carboplatin and paclitaxel, status post 6 weekly doses of treatment last dose was given on 12/05/2011. Patient presented 12/12/2011 with intractable nausea and vomiting and radiation esophagitis / oral thrush. Patient now receiving TNA for nutritional support. Hospital course complicated by the development of possible aspiration pneumonia, for which he has been treated with a 7 day course of Levaquin. The barrier to discharge is ongoing TNA dependence and poor oral intake.  He is scheduled to receive a PEG tube today for ongoing nutritional support.  We are in the process of weaning the TNA.   Assessment/Plan: Principal Problem:  *Radiation esophagitis in the setting of non small cell lung carcinoma status post radiation therapy   Continue fluconazole 200 mg daily, Protonix 40 mg IV daily.  Still with poor by mouth intake.  Will need skilled nursing home placement versus home health RN for ongoing nutritional support.  The patient's wife does not think she can handle him at home, and was not satisfied with either SNF option that was capable of TNA support.  Met with family 12/25/11 along with Dr. Arbutus Ped and the patient and his wife consent to placement of a PEG tube, which may offer further SNF options.  Attempting slow TNA wean to improve appetite.  Will start TF per dietician once PEG placed and cleared for use. Active Problems:  Urinary Retention  Developed urinary retention necessitating temporary placement of a foley catheter on 12/17/11.  Foley subsequently removed 12/21/11.  Follow up bladder  scans showed some ongoing mild retention, so Flomax was started on 12/25/11.  Subsequent bladder scans showed no retention. Aspiration pneumonia   Seen by speech therapist on 12/18/2011: recommended dysphagia 3 diet, thin liquids.  Continue to encourage po intake.   Status post a 7 day course of Levaquin. Non-small cell lung cancer   Seen by Dr. Arbutus Ped on 12/18/2011. Family meeting held with wife and Dr. Arbutus Ped on 12/25/11.  Further radiation and chemotherapy on hold; Family does not want to proceed with further radiation treatments.  Dr. Arbutus Ped favors no further XRT.  Offered to get palliative care involved for goals of care, symptom management and discharge planning, but patient's wife declined. Pancytopenia / Anemia of chronic disease   Likely secondary to sequela of chemotherapy.  White blood cell count is slightly low, hemoglobin is stable, platelet count is normal. The patient did receive one unit of packed red blood cells on 12/15/2011 Hypokalemia/ Hypomagnesemia   Monitor electrolytes closely and replenish when indicated. Protein calorie malnutrition   Secondary to malignancy and poor oral intake due to radiation esophagitis.  TNA initiated on 12/13/2011 with ongoing problems with poor oral intake.  Slow wean (by 10 cc/hr/day) ordered, but weaned off overnight.  Currently weaning slowly, by 10cc/hr/day, but can wean faster once TF started.  Continue dysphagia 3 diet as tolerated. Hypertension   Blood pressure currently controlled.  Code Status: Full Family Communication: Wife updated at bedside.  Disposition Plan: SNF placement.   Medical Consultants:  Dr. Si Gaul, Oncology  Billie Ruddy, PA, physical medicine and rehabilitation  Dr. Irish Lack, IR  Other Consultants:  Physical therapy: Recommend SNF.  Dietitian: Ongoing inadequate oral intake.  Pharmacy: TNA. and antibiotic dosing.  Procedures:  PICC placement  12/13/2011.  Antibiotics:  Levaquin 12/19/2011--->12/25/11  Diflucan 12/13/2011--->  Vancomycin 12/17/2011---> 12/19/2011  Zosyn 12/17/2011---> 12/19/2011   HPI/Subjective: Justin Pittman denies cough today.  He still has sharp chest pain with swallowing, necessitating PRN pain medications.  No N/V.  Bowels moved 12/25/11.  No abdominal discomfort or complaints of incomplete bladder emptying.  Objective: Filed Vitals:   12/25/11 0545 12/25/11 1300 12/25/11 2057 12/26/11 0451  BP: 106/63 116/64 106/61 112/57  Pulse: 89 77 90 89  Temp: 97.4 F (36.3 C) 98 F (36.7 C) 98.8 F (37.1 C) 97.1 F (36.2 C)  TempSrc: Oral Oral Oral Oral  Resp: 15 16 15    Height:      Weight:      SpO2: 99% 97% 98% 97%    Intake/Output Summary (Last 24 hours) at 12/26/11 0858 Last data filed at 12/26/11 0842  Gross per 24 hour  Intake    300 ml  Output   1325 ml  Net  -1025 ml    Exam: Gen:  NAD Cardiovascular:  RRR, No M/R/G Respiratory: Lungs CTAB Gastrointestinal: Abdomen soft, NT/ND with normal active bowel sounds. Extremities: No C/E/C  Data Reviewed: Basic Metabolic Panel:  Lab 12/26/11 8413 12/25/11 0600 12/23/11 0624 12/21/11 0610  NA 132* 132* 132* 131*  K 3.7 4.3 -- --  CL 95* 95* 96 97  CO2 29 27 29 27   GLUCOSE 128* 95 129* 128*  BUN 24* 27* 24* 20  CREATININE 0.92 0.92 0.92 0.88  CALCIUM 8.7 9.1 8.8 8.5  MG -- 1.6 1.7 1.4*  PHOS -- 4.2 -- 3.6   GFR Estimated Creatinine Clearance: 58.6 ml/min (by C-G formula based on Cr of 0.92). Liver Function Tests:  Lab 12/25/11 0600 12/21/11 0610  AST 39* 19  ALT 53 14  ALKPHOS 135* 84  BILITOT 0.6 0.2*  PROT 6.2 5.5*  ALBUMIN 2.7* 2.3*    CBC:  Lab 12/26/11 0615 12/25/11 0600 12/23/11 0624  WBC 3.9* 5.0 4.4  NEUTROABS -- 3.7 --  HGB 8.1* 9.0* 8.3*  HCT 23.6* 26.2* 24.4*  MCV 80.8 80.4 81.3  PLT 157 188 145*   CBG:  Lab 12/26/11 0747 12/25/11 2358 12/25/11 1559 12/25/11 0832 12/24/11 2326  GLUCAP 135* 136*  148* 101* 98   Lipid Profile  Basename 12/25/11 0600  CHOL 96  HDL --  LDLCALC --  TRIG 112  CHOLHDL --  LDLDIRECT --   Microbiology Recent Results (from the past 240 hour(s))  URINE CULTURE     Status: Normal   Collection Time   12/17/11  7:04 PM      Component Value Range Status Comment   Specimen Description URINE, CLEAN CATCH   Final    Special Requests Immunocompromised   Final    Culture  Setup Time 12/17/2011 23:02   Final    Colony Count NO GROWTH   Final    Culture NO GROWTH   Final    Report Status 12/18/2011 FINAL   Final   CULTURE, BLOOD (ROUTINE X 2)     Status: Normal   Collection Time   12/17/11  7:28 PM      Component Value Range Status Comment   Specimen Description BLOOD LEFT ARM  10 ML IN [facility] BOTTLE   Final    Special Requests Immunocompromised   Final    Culture  Setup Time 12/18/2011 10:16   Final  Culture NO GROWTH 5 DAYS   Final    Report Status 12/24/2011 FINAL   Final   CULTURE, BLOOD (ROUTINE X 2)     Status: Normal   Collection Time   12/17/11  7:35 PM      Component Value Range Status Comment   Specimen Description BLOOD LEFT ARM  10 ML IN [facility] BOTTLE   Final    Special Requests Immunocompromised   Final    Culture  Setup Time 12/18/2011 10:16   Final    Culture NO GROWTH 5 DAYS   Final    Report Status 12/24/2011 FINAL   Final      Studies:  Dg Chest 2 View 12/12/2011 IMPRESSION: Decreased size of the right perihilar mass with question of necrosis /cavitation.  Otherwise, no acute change identified.   Original Report Authenticated By: Waneta Martins, M.D.     Dg Chest Port 1 View 12/17/2011  IMPRESSION: No acute disease in the chest.   Original Report Authenticated By: Gwynn Burly, M.D.     Scheduled Meds:    .  ceFAZolin (ANCEF) IV  1 g Intravenous On Call  . fluconazole (DIFLUCAN) IV  200 mg Intravenous Q24H  . insulin aspart  0-15 Units Subcutaneous Q8H  . pantoprazole (PROTONIX) IV  40 mg Intravenous Q24H  .  phosphorus  250 mg Oral Daily  . polyethylene glycol  17 g Oral Daily  . sodium chloride  500 mL Intravenous Once  . sodium chloride  10-40 mL Intracatheter Q12H  . Tamsulosin HCl  0.4 mg Oral Daily   Continuous Infusions:    . sodium chloride 20 mL (12/22/11 0352)  . fat emulsion 250 mL (12/25/11 1723)  . TPN (CLINIMIX) +/- additives    . TPN (CLINIMIX) +/- additives 60 mL/hr at 12/25/11 1723  . TPN (CLINIMIX) +/- additives    . DISCONTD: TPN (CLINIMIX) +/- additives 70 mL/hr at 12/25/11 1016  . DISCONTD: TPN (CLINIMIX) +/- additives      Time spent: 25 minutes.     LOS: 14 days   RAMA,CHRISTINA  [facility] Pager [phone].  If 8PM-8AM, please contact night-coverage at [email or web address], password [facility] 12/26/2011, 8:58 AM

## 2011-12-26 NOTE — Progress Notes (Signed)
Attempted to see pt this morning.  Pt waiting PEG placement today.  Pt just walked with PT and does not want to get up with OT. Spoke  At length with son and pt about continuing to get up with therapy even though he knows " how to bathe and dress."  Talked about  Endurance and activity tolerance issues.  Pt agreed to do therapy tomorrow.  Will check back as schedule allows. Tory Emerald, [place]  [phone]

## 2011-12-26 NOTE — Progress Notes (Signed)
Clinical Social Worker met with pt wife alone in hallway and provided current bed offers. Clinical Social Worker clarified pt wife question and pt wife stated that she plans to tour facilities tomorrow.  Clinical Social Worker and RNCM followed up with pt son to discuss that medical team is planning for discharge on Thursday. Pt son expressed understanding and confirmed that pt family plans to tour facilities tomorrow. Clinical Social Worker left contact information to contact me with any questions and will update family on any further bed offers. Clinical Social Worker to continue to follow and facilitate pt discharge needs when pt medically ready for discharge.  Jacklynn Lewis, MSW, LCSWA  Clinical Social Work [phone]

## 2011-12-26 NOTE — Progress Notes (Signed)
Nutrition Follow-up  Intervention: Once PEG able to be used, recommend bolus tube feeds of Osmolite 1.5, start at 1/2 can q4hrs tomorrow then 1 can q4hrs the following day for goal of 6 cans/day. This will provide 2130 calories, 89g protein, free water, meeting 109% estimated calorie needs and 105% estimated protein needs. Recommend 60ml free water flushes before and after each bolus and an additional water flushes TID if IVF d/c. Will monitor.   Diet Order:  Dysphagia 3, thin  TPN: Clinimix E 5/20 @ 60 ml/hr.  Lipids (20% IVFE @ 10 ml/hr), multivitamins, and trace elements are provided 3 times weekly (MWF) due to national backorder.  Provides 1473 kcal and 72 grams protein daily (based on weekly average).  Meets 75% minimum estimated kcal and 85% minimum estimated protein needs.  Additional IVF with NS @ 10 ml/hr.  - Pt had PEG placed today after discussion with oncologist yesterday which will be able to be used in 24 hours. Pt still with pain with swallowing. Meal intake 0-5% documented for the past several days.   - Pt with persistent low sodium - CBGs controlled - Alk phos and AST elevated - PALB improved from 5.6 mg/dL on 16/10 to 13 mg/dL on 96/04  Meds: Scheduled Meds:   .  ceFAZolin (ANCEF) IV  1 g Intravenous On Call  .  ceFAZolin (ANCEF) IV  1 g Intravenous Once  . fentaNYL      . fluconazole (DIFLUCAN) IV  200 mg Intravenous Q24H  . insulin aspart  0-15 Units Subcutaneous Q8H  . lidocaine      . midazolam      . pantoprazole (PROTONIX) IV  40 mg Intravenous Q24H  . phosphorus  250 mg Oral Daily  . polyethylene glycol  17 g Oral Daily  . sodium chloride  500 mL Intravenous Once  . sodium chloride  10-40 mL Intracatheter Q12H  . Tamsulosin HCl  0.4 mg Oral Daily  . DISCONTD: ceFAZolin       Continuous Infusions:   . sodium chloride 20 mL (12/22/11 0352)  . fat emulsion 250 mL (12/25/11 1723)  . TPN (CLINIMIX) +/- additives    . TPN (CLINIMIX) +/- additives  60 mL/hr at 12/25/11 1723  . TPN (CLINIMIX) +/- additives     PRN Meds:.acetaminophen, acetaminophen, fentaNYL, guaiFENesin-dextromethorphan, HYDROmorphone (DILAUDID) injection, midazolam, morphine, ondansetron (ZOFRAN) IV, ondansetron, senna-docusate, sodium chloride   CMP     Component Value Date/Time   NA 132* 12/26/2011 0615   NA 136 12/05/2011 1006   K 3.7 12/26/2011 0615   K 4.1 12/05/2011 1006   CL 95* 12/26/2011 0615   CL 101 12/05/2011 1006   CO2 29 12/26/2011 0615   CO2 24 12/05/2011 1006   GLUCOSE 128* 12/26/2011 0615   GLUCOSE 100* 12/05/2011 1006   BUN 24* 12/26/2011 0615   BUN 24.0 12/05/2011 1006   CREATININE 0.92 12/26/2011 0615   CREATININE 0.9 12/05/2011 1006   CALCIUM 8.7 12/26/2011 0615   CALCIUM 9.1 12/05/2011 1006   PROT 6.2 12/25/2011 0600   PROT 6.4 12/05/2011 1006   ALBUMIN 2.7* 12/25/2011 0600   ALBUMIN 3.7 12/05/2011 1006   AST 39* 12/25/2011 0600   AST 14 12/05/2011 1006   ALT 53 12/25/2011 0600   ALT 11 12/05/2011 1006   ALKPHOS 135* 12/25/2011 0600   ALKPHOS 152* 12/05/2011 1006   BILITOT 0.6 12/25/2011 0600   BILITOT 0.70 12/05/2011 1006   GFRNONAA 79* 12/26/2011 0615   GFRAA >90  12/26/2011 0615    CBG (last 3)   Basename 12/26/11 0747 12/25/11 2358 12/25/11 1559  GLUCAP 135* 136* 148*     Intake/Output Summary (Last 24 hours) at 12/26/11 1557 Last data filed at 12/26/11 1409  Gross per 24 hour  Intake    300 ml  Output   1550 ml  Net  -1250 ml   Last BM - 10/27  Weight Status:  No new weights  Estimated needs:  1950-2300 calories  85-100g protein   Nutrition Dx: Inadequate oral intake - ongoing   Goal: TPN to meet >90% of estimated nutritional needs - met   Monitor: Weights, labs, intake, BM, TPN   Levon Hedger MS, RD, LDN  [phone] Pager  [phone] After Hours Pager

## 2011-12-27 ENCOUNTER — Ambulatory Visit: Payer: Medicare Other

## 2011-12-27 ENCOUNTER — Inpatient Hospital Stay (HOSPITAL_COMMUNITY): Payer: Medicare Other

## 2011-12-27 LAB — GLUCOSE, CAPILLARY
Glucose-Capillary: 135 mg/dL — ABNORMAL HIGH (ref 70–99)
Glucose-Capillary: 145 mg/dL — ABNORMAL HIGH (ref 70–99)

## 2011-12-27 MED ORDER — FREE WATER
60.0000 mL | Freq: Every day | Status: DC
  Administered 2011-12-27 – 2011-12-28 (×2): 60 mL

## 2011-12-27 MED ORDER — TRACE MINERALS CR-CU-F-FE-I-MN-MO-SE-ZN IV SOLN
INTRAVENOUS | Status: DC
  Administered 2011-12-27: 19:00:00 via INTRAVENOUS
  Filled 2011-12-27: qty 1000

## 2011-12-27 MED ORDER — FAT EMULSION 20 % IV EMUL
250.0000 mL | INTRAVENOUS | Status: DC
  Administered 2011-12-27: 250 mL via INTRAVENOUS
  Filled 2011-12-27: qty 250

## 2011-12-27 MED ORDER — OSMOLITE 1.5 CAL PO LIQD
119.0000 mL | ORAL | Status: DC
  Administered 2011-12-27 (×2): 119 mL
  Administered 2011-12-28
  Administered 2011-12-28 (×2): 119 mL
  Filled 2011-12-27 (×7): qty 237

## 2011-12-27 MED ORDER — OSMOLITE 1.5 CAL PO LIQD
119.0000 mL | ORAL | Status: DC
  Administered 2011-12-27: 119 mL
  Filled 2011-12-27: qty 237

## 2011-12-27 NOTE — Progress Notes (Addendum)
Physical Therapy Treatment Patient Details Name: Justin Pittman MRN: [id number] DOB: 1932/07/25 Today's Date: 12/27/2011 Time: 0850-0902 PT Time Calculation (min): 12 min  PT Assessment / Plan / Recommendation Comments on Treatment Session  pt requires max encouragement, slower progress due to limited particiaption/medical isssues    Follow Up Recommendations  Post acute inpatient     Does the patient have the potential to tolerate intense rehabilitation  No, Recommend SNF  Barriers to Discharge        Equipment Recommendations  Rolling walker with 5" wheels;3 in 1 bedside comode    Recommendations for Other Services    Frequency Min 3X/week   Plan Discharge plan remains appropriate;Frequency remains appropriate    Precautions / Restrictions Precautions Precautions: Fall   Pertinent Vitals/Pain     Mobility  Bed Mobility Bed Mobility: Supine to Sit Supine to Sit: 4: supervision; HOB flat  Details for Bed Mobility Assistance: moves slowly and requires encouragement. Transfers Transfers: Sit to Stand;Stand to Sit Sit to Stand: With upper extremity assist;From bed;4: Min/guard assist Stand to Sit: 4: Min guard;With upper extremity assist;To chair/3-in-1;With armrests Stand Pivot Transfers: 4: Min guard Details for Transfer Assistance: Min VCs for hand placement and technique manipulating RW. Ambulation/Gait Ambulation/Gait Assistance: 4: Min guard Ambulation Distance (Feet): 5 Feet Assistive device: Rolling walker Ambulation/Gait Assistance Details: pt requires max encouragement, refuses to go any further Gait Pattern: Step-through pattern Gait velocity: decreased    Exercises Other Exercises Other Exercises: encouraged ankle pumps and quad sets, pt completes 5 reps each bil   PT Diagnosis:    PT Problem List:   PT Treatment Interventions:     PT Goals Acute Rehab PT Goals Time For Goal Achievement: 12/28/11 Potential to Achieve Goals: Good Pt will go  Supine/Side to Sit: with supervision PT Goal: Supine/Side to Sit - Progress: Met Pt will go Sit to Stand: with supervision PT Goal: Sit to Stand - Progress: Progressing toward goal Pt will go Stand to Sit: with supervision PT Goal: Stand to Sit - Progress: Progressing toward goal Pt will Ambulate: with rolling walker;>150 feet;with supervision PT Goal: Ambulate - Progress: Progressing toward goal  Visit Information  Last PT Received On: 12/27/11 Assistance Needed: +1    Subjective Data  Subjective: I am too sore to do anything   Cognition  Overall Cognitive Status: Appears within functional limits for tasks assessed/performed Arousal/Alertness: Awake/alert Orientation Level: Appears intact for tasks assessed Behavior During Session: Lethargic    Balance     End of Session PT - End of Session Activity Tolerance: Patient limited by fatigue;Patient limited by pain Patient left: in chair;with call bell/phone within reach   GP     [facility] 12/27/2011, 9:04 AM

## 2011-12-27 NOTE — Progress Notes (Signed)
PARENTERAL NUTRITION CONSULT NOTE - FOLLOW UP  Pharmacy Consult for TNA  Indication: Poor oral intake 2/2 radiation esophagitis  No Known Allergies  Patient Measurements: Height: 5\' 10"  (177.8 cm) Weight: 135 lb 12.8 oz (61.598 kg) IBW/kg (Calculated) : 73  Usual Weight: 71 kg   Vital Signs: Temp: 98.4 F (36.9 C) (10/30 0547) Temp src: Oral (10/30 0547) BP: 123/66 mmHg (10/30 0547) Pulse Rate: 78  (10/30 0547) Intake/Output from previous day: 10/29 0701 - 10/30 0700 In: 40 [I.V.:40] Out: 2625 [Urine:2625] Intake/Output from this shift: Total I/O In: -  Out: 200 [Urine:200]  Labs:  [facility] 12/26/11 0615 12/25/11 0600  WBC 3.9* 5.0  HGB 8.1* 9.0*  HCT 23.6* 26.2*  PLT 157 188  APTT 31 --  INR 1.02 --     Basename 12/26/11 0615 12/25/11 0600  NA 132* 132*  K 3.7 4.3  CL 95* 95*  CO2 29 27  GLUCOSE 128* 95  BUN 24* 27*  CREATININE 0.92 0.92  LABCREA -- --  CREAT24HRUR -- --  CALCIUM 8.7 9.1  MG -- 1.6  PHOS -- 4.2  PROT -- 6.2  ALBUMIN -- 2.7*  AST -- 39*  ALT -- 53  ALKPHOS -- 135*  BILITOT -- 0.6  BILIDIR -- --  IBILI -- --  PREALBUMIN -- 13.0*  TRIG -- 112  CHOLHDL -- --  CHOL -- 96   Estimated Creatinine Clearance: 58.6 ml/min (by C-G formula based on Cr of 0.92).   Insulin Requirements in the past 24 hours:  CBGs 101-148 Required 6 units moderate SSI Q8h  Nutritional Goals RD rec 10/16:1950-2300 kcal, 85-100g protein Clinimix E5/20 at a goal rate of 80 ml/hr with lipids MWF will provide 96 g protein daily and 1689 kcal TTSS, 2169 kcal MWF, and an average of 1896 kcal/day.   Current Nutrition Clinimix E5/15 @ 50 ml/hr - patient to start tube feeds today NPO for PEG tube placement on 10/29, diet not yet resumed  Asssessment: 79 yom with h/o stage IIIB NSCLC s/p concurrent chemoradiation for 6 weeks (carboplatin/paclitaxel) with last doses given 12/05/11. Patient presented 12/12/2011 for intractable nausea and vomiting associated with  dysphagia and thrush secondary to radiation induced esophagitis. TNA started 10/16 for poor oral intake and protein calorie malnutrition. Chemo and radiation currently on hold until improvement in condition. Spouse stated no further Rad/Onc for patient. PEG tube to be placed today.  Labs: Renal function: Scr stable/wnl (10/29) Hepatic function: AST slightly above ULN - ALT/TBili wnl (10/28) Electrolytes: Na+ remains low (cannot be adjusted in TNA), Mag wnl (10/28), Phos wnl 10/28 on KPhos 250mg  PO daily Pre-Albumin: 10.8 (10/17), 5.6 (10/21), 13 (10/28) TG/Cholesterol: wnl 10/17, 10/21, 10/28  Plan:    Patient to begin tube feeding. Will work with RD to wean off TNA as tube feeds are tolerated.  Slow wean of TNA, will ask nurse to decrease TNA to 40 ml/hr now.   Decrease TNA to 30 ml/hr tonight at 1800, can wean off tomorrow if tube feeds tolerated.  TNA to contain IV fat emulsion, standard multivitamins and trace elements only on MWF only due to ongoing shortage  Continue SSI q8h  TNA labs Monday/Thursdays  Pharmacy will follow up daily  Tammy Sours, Pharm.D. Clinical Oncology Pharmacist  Pager # [phone]  12/27/2011 10:44 AM

## 2011-12-27 NOTE — Progress Notes (Signed)
Pt's wife Marylu Lund expressed great concern about the her husband (the pt) being discharged tomorrow. I explained and reinforced with her that although he just had his PEG tube placed yesterday we would not give him the entire 6 cans of Osmolite for nutrition all at once and that we would gradually build him up to that amount starting by giving him the 1/2 can every 4 hours as reccommended by dietary and as ordered by the MD, doing this to make sure that he tolerates the feeds well. His first dose was given about 1040 this morning and the second dose was scheduled and given about 1530. He tolerated both feeds very well with NO c/o of nausea, vomiting or ABD pain after the feeds were given. The pt himself has been denying any pain today that needed any pain med's. In fact, he has been resting/ sleeping very well today on my shift. The pt's wife however is insisting that he is in great pain and needed his IV pain med, Dilaudid. This all came after the patients wife questioned me earlier about his tube feeds and him being discharged tomorrow and stated to her that "yes, the plan was the he would be discharged tomorrow, " the wife then became very anxious and upset and walked out of the room and requested to speak with the unit AD. After the wife returned to the room their son came to visit and was present for the conversation that myself and Heather,RD had with the patient, wife and son. We both tried to explain clearly to her that her husband would not be kicked out and that so far he was tolerating the tube feeds very and explained again the dosing and schedule of the tube feeds. While all were still present in the room the patient himself stated that "he did not feel any worse today than he has any this admission" and "that he did not feel any worse after the tube feeds that he felt the same."  This was all passed on to the  Unit AD and charge RN. Will continue to monitor.

## 2011-12-27 NOTE — Progress Notes (Addendum)
Clinical Social Worker met at [employer] with pt wife privately in hallway as pt was sleeping soundly at this time. Clinical Social Worker discussed with pt wife that pt had received bed offer from [facility], but room and board at [facility] would be privately paid by pt family. Pt wife expressed understanding in regard to this as pt son works at [facility]. Pt wife stated that pt wife and pt son plan to visit [facility] this afternoon and then make decision between [facility] and [facility]. Clinical Social Worker provided supportive counseling to pt wife as she discussed her feelings surrounding pt cancer and this hospitalization. Clinical Social Worker encouraged pt wife to notify this Clinical Social Worker once decision about SNF is made and continued discussion with pt wife that plan is for discharge tomorrow. Pt wife appeared to express understanding. Clinical Social Worker to continue to follow and facilitate pt discharge to SNF tomorrow if pt medically ready.  Jacklynn Lewis, MSW, LCSWA  Clinical Social Work [phone]

## 2011-12-27 NOTE — Progress Notes (Signed)
Nutrition Brief Note  - Obtained verbal order to initiate TF from MD, will order 1/2 can of Osmolite 1.5 q4hrs with 60ml of free water flushes before and after reach TF bolus. Will work with pt/family, pharmacist, and nursing for TF tolerance and adjustment of TPN.   Levon Hedger MS, RD, LDN [phone] Pager [phone] After Hours Pager

## 2011-12-27 NOTE — Progress Notes (Signed)
Patient's wife asked to see me this afternoon.  She states that she is frustrated with everyone pushing her husband out the door.  She tells me that she has concerns that her husband is not going to be ready for discharge tomorrow to a SNF as he just started his tube feeding today.  She spoke at length about the various nursing homes she visited last week and how unsuitable they were for her husband.   She also commented that she does not appreciate our staff members entering the room and  saying "he is going to the nursing home tomorrow" and upsetting her.    I reviewed with Mrs Mauss the dietary plan noted in the pt record for giving him 3 1/2 cans of TF today.  I spoke with the nurse Shanda Bumps to validate that she was about to give him his second feeding.  I assured Mrs Gorka that the dietician Herbert Seta has plans to return to the room to meet with her and review the feeding plan (per nurse Shanda Bumps.)  And I assured Mrs Stavola that her husband would not be sent out of the hospital if he was not ready, and that all members of the health care team had deemed him ready. I explained to her that, as department manager, I have been following his care for the past week and have been aware of the numerous conversations and meetings she has had with the case manager, social worker and MD to meet with her and speak to her about the discharge plan.  I validated her anxiety about the situation.  She left my office with the understanding that her husband would leave tomorrow and would be going to [facility].

## 2011-12-27 NOTE — Progress Notes (Signed)
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 Patient was admitted 12/12/2011 with intractable nausea and vomiting and radiation esophagitis and oral thrush. Hospital course was complicated by the development of possible aspiration pneumonia, for which he has been treated with a 7 day course of Levaquin. Patient was receiving TNA for nutritional support. Patient now has PEG tube and plan is for discharge to SNF tomorrow.  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  Status post PEG tube placement 12/26/2011 and feeds initiated  Active Problems:  Urinary Retention  Patient required foley catheter for urinary retention This has improved with Flomax Subsequent bladder scans showed no retention. Aspiration pneumonia  May try dysphagia 3 diet. Patient has completed 7 days of Levaquin  Non-small cell lung cancer  Seen by Dr. Arbutus Ped on 12/18/2011. Family meeting held with wife and Dr. Arbutus Ped on 12/25/11.  Further radiation and chemotherapy on hold; Family does not want to proceed with further radiation treatments. Dr. Arbutus Ped favors no further XRT.  Patient's wife declined palliative care consultation for goals of care Pancytopenia / Anemia of chronic disease  Likely secondary to sequela of chemotherapy.  Status post of 1 unit PRBC transfusion while in hospital Hypokalemia/ Hypomagnesemia   repleted Protein calorie malnutrition  Secondary to malignancy and poor oral intake due to radiation esophagitis.  TNA initiated on 12/13/2011 with ongoing problems with poor oral intake.  Status post PEG tube placement 12/26/2011 Continue dysphagia 3 diet as tolerated. Hypertension  Blood pressure at goal, 123/66  Code Status: Full  Family Communication: family not at bedside Disposition Plan: SNF in am  Medical Consultants:  Dr. Si Gaul, Oncology  Billie Ruddy, PA, physical medicine and rehabilitation  Dr. Irish Lack, IR Other Consultants:  Physical therapy: Recommend SNF.  Dietitian: Ongoing inadequate oral intake.  Pharmacy: TNA. and antibiotic dosing. Procedures:  PICC placement 12/13/2011. PEG placement 12/26/2011 Antibiotics:  Levaquin 12/19/2011--->12/25/11  Diflucan 12/13/2011--->  Vancomycin 12/17/2011---> 12/19/2011  Zosyn 12/17/2011---> 12/19/2011  Manson Passey, MD  [facility] Pager [phone]  If 7PM-7AM, please contact night-coverage [email or web address] Password [email or web address] 12/27/2011, 12:13 PM   LOS: 15 days   HPI/Subjective: No acute events overnight.  Objective: Filed Vitals:   12/26/11 1540 12/26/11 1552 12/26/11 2102 12/27/11 0547  BP: 141/80 145/67 105/57 123/66  Pulse: 103 99 106 78  Temp:   98.1 F (36.7 C) 98.4 F (36.9 C)  TempSrc:   Oral Oral  Resp: 23 20 16 15   Height:      Weight:      SpO2: 100% 100% 97% 98%    Intake/Output Summary (Last 24 hours) at 12/27/11 1213 Last data filed at 12/27/11 1100  Gross per 24 hour  Intake    280 ml  Output   2575 ml  Net  -2295 ml    Exam:   General:  Pt is alert, follows commands appropriately, not in acute distress  Cardiovascular: Regular rate and rhythm, S1/S2, no murmurs, no rubs,  no gallops  Respiratory: Clear to auscultation bilaterally, no wheezing, no crackles, no rhonchi  Abdomen: Soft, non tender, non distended, bowel sounds present, no guarding  Extremities: No edema,  pulses DP and PT palpable bilaterally  Neuro: Grossly nonfocal  Data Reviewed: Basic Metabolic Panel:  Lab 12/26/11 1610 12/25/11 0600 12/23/11 0624 12/21/11 0610  NA 132* 132* 132* 131*  K 3.7 4.3 4.1 4.1  CL 95* 95* 96 97  CO2 29 27 29 27   GLUCOSE 128* 95 129* 128*  BUN 24* 27* 24* 20  CREATININE 0.92 0.92 0.92 0.88  CALCIUM 8.7 9.1 8.8 8.5   Liver Function Tests:  Lab 12/25/11 0600 12/21/11 0610  AST 39* 19  ALT 53 14  ALKPHOS 135* 84  BILITOT 0.6 0.2*  PROT 6.2 5.5*  ALBUMIN 2.7* 2.3*   CBC:  Lab 12/26/11 0615 12/25/11 0600 12/23/11 0624  WBC 3.9* 5.0 4.4  HGB 8.1* 9.0* 8.3*  HCT 23.6* 26.2* 24.4*  MCV 80.8 80.4 81.3  PLT 157 188 145*   CBG:  Lab 12/27/11 0757 12/27/11 12/26/11 1635 12/26/11 0747 12/25/11 2358  GLUCAP 135* 142* 142* 135* 136*    URINE CULTURE     Status: Normal   Collection Time   12/17/11  7:04 PM      Component Value Range Status Comment   Specimen Description URINE  Final    Culture NO GROWTH   Final    Report Status 12/18/2011 FINAL   Final   CULTURE, BLOOD (ROUTINE X 2)     Status: Normal   Collection Time   12/17/11  7:28 PM      Component Value Range Status Comment   Specimen Description BLOOD LEFT ARM  10 ML IN [facility] BOTTLE   Final    Culture NO GROWTH 5 DAYS   Final    Report Status 12/24/2011 FINAL   Final   CULTURE, BLOOD (ROUTINE X 2)     Status: Normal   Collection Time   12/17/11  7:35 PM      Component Value Range Status Comment   Specimen Description BLOOD LEFT ARM  10 ML IN [facility] BOTTLE   Final    Culture NO GROWTH 5 DAYS   Final    Report Status 12/24/2011 FINAL   Final      Studies: Dg Abd 1 View 12/26/2011  * IMPRESSION: Contrast in large and small bowel loops to the level of the distal transverse colon.   Original Report Authenticated By: Consuello Bossier, M.D.    Ir Gastrostomy Tube Mod Sed 12/26/2011  *  IMPRESSION: Percutaneous gastrostomy with placement of a 20-French bumper retention tube in the body of  the stomach.  This tube can be used for percutaneous feeds beginning in 24 hours after placement.   Original Report Authenticated By: Reola Calkins, M.D.     Scheduled Meds:  . feeding supplement (OSMOLITE 1.5 CAL)  119 mL Per Tube Q24H  . fentaNYL      . fluconazole (DIFLUCAN)   200 mg Intravenous Q24H  . free water  60 mL Per Tube Daily  . insulin aspart  0-15 Units Subcutaneous Q8H  . pantoprazole   40 mg Intravenous Q24H  . phosphorus  250 mg Oral Daily  . polyethylene glycol  17 g Oral Daily  . Tamsulosin HCl  0.4 mg Oral Daily

## 2011-12-27 NOTE — Progress Notes (Signed)
Nutrition Brief Note  - Discussed TF plan with pt's wife earlier this morning and again this afternoon with pt's son in the room during afternoon meeting. Both discussions with >15 minutes in length and reviewed in detail the plan for gradually increasing TF from 1/2 can today q4hs to 1 can tomorrow q4hrs with TPN weaning in the process. Answered all of the questions of pt's wife and son regarding pt's nutrition. Afternoon conversation occurred with RN present in the room. Pt has had 1/2 can of Osmolite 1.5 twice so far today which pt tolerating well both times. Will proceed with TF plan. Provided emotional support to wife with both discussions as she expressed feeling overwhelmed with pt's illness, long length of stay, and plans for discharge. Will continue to monitor and be available for nutrition concerns.   Levon Hedger MS, RD, LDN [phone] Pager [phone] After Hours Pager

## 2011-12-27 NOTE — Progress Notes (Signed)
Subjective: Pt sitting in chair, drowsy but arousable; sore at G tube site  Objective: Vital signs in last 24 hours: Temp:  [98.1 F (36.7 C)-98.6 F (37 C)] 98.4 F (36.9 C) (10/30 0547) Pulse Rate:  [78-124] 78  (10/30 0547) Resp:  [12-30] 15  (10/30 0547) BP: (105-178)/(57-99) 123/66 mmHg (10/30 0547) SpO2:  [97 %-100 %] 98 % (10/30 0547) Last BM Date: 12/25/11  Intake/Output from previous day: 10/29 0701 - 10/30 0700 In: 40 [I.V.:40] Out: 2625 [Urine:2625] Intake/Output this shift: Total I/O In: -  Out: 200 [Urine:200]  G tube intact, dressing dry, insertion site tender,  no evidence of leaking, abd soft, ND  Lab Results:   [facility] 12/26/11 0615 12/25/11 0600  WBC 3.9* 5.0  HGB 8.1* 9.0*  HCT 23.6* 26.2*  PLT 157 188   BMET  Basename 12/26/11 0615 12/25/11 0600  NA 132* 132*  K 3.7 4.3  CL 95* 95*  CO2 29 27  GLUCOSE 128* 95  BUN 24* 27*  CREATININE 0.92 0.92  CALCIUM 8.7 9.1   PT/INR  Basename 12/26/11 0615  LABPROT 13.3  INR 1.02   ABG No results found for this basename: PHART:2,PCO2:2,PO2:2,HCO3:2 in the last 72 hours  Studies/Results: Dg Abd 1 View  12/26/2011  *RADIOLOGY REPORT*  Clinical Data: Gastrostomy tube placement.  Evaluate barium location.  ABDOMEN - 1 VIEW  Comparison: P E T of 09/05/2011.  Findings: 2 supine views. Non-obstructive bowel gas pattern.  No free intraperitoneal air.  Contrast within both large and small bowel.  Moderate amount of contrast to the level of the distal transverse colon.  No distal colonic contrast identified.  Distal gas.  IMPRESSION: Contrast in large and small bowel loops to the level of the distal transverse colon.   Original Report Authenticated By: Consuello Bossier, M.D.    Ir Gastrostomy Tube Mod Sed  12/26/2011  *RADIOLOGY REPORT*  Clinical Data:  History of lung carcinoma with radiation esophagitis, dysphagia and malnutrition due to poor oral intake. The patient requires a gastrostomy tube for  nutritional needs.  PERCUTANEOUS GASTROSTOMY TUBE PLACEMENT  Sedation:  1.0 mg IV Versed; 100 mcg IV Fentanyl.  Total Moderate Sedation Time: 16 minutes.  Contrast:  20 ml Omnipaque-300  Additional Medications:  1 gram IV Ancef.  As antibiotic prophylaxis, Ancef was ordered pre-procedure and administered intravenously within one hour of incision.  Fluoroscopy Time: 3.1 minutes.  Procedure:  The procedure, risks, benefits, and alternatives were explained to the patient.  Questions regarding the procedure were encouraged and answered.  The patient understands and consents to the procedure.  The evening prior to the procedure, the patient was given thin liquid barium to ingest in order to opacify the colon.  The throat was sprayed with topical anesthetic.  A 5-French catheter was then advanced through the patient's mouth under fluoroscopy into the esophagus and to the level of the stomach.  This catheter was used to insufflate the stomach with air under fluoroscopy.  The abdominal wall was prepped with Betadine in a sterile fashion, and a sterile drape was applied covering the operative field.  A sterile gown and sterile gloves were used for the procedure. Local anesthesia was provided with 1% Lidocaine.  A skin incision was made in the upper abdominal wall.  Under fluoroscopy, an 18 gauge trocar needle was advanced into the stomach.  Contrast injection was performed to confirm intraluminal position of the needle tip.  A single T tack was then deployed in the lumen  of the stomach.  This was brought up to tension at the skin surface.  Over a guidewire, a 9-French sheath was advanced into the lumen of the stomach.  The wire was left in place as a safety wire.  A loop snare device from a percutaneous gastrostomy kit was then advanced into the stomach.  A floppy guide wire was advanced through the orogastric catheter under fluoroscopy in the stomach.  The loop snare advanced through the percutaneous gastric access was used  to snare the guide wire. This allowed withdrawal of the loop snare out of the patient's mouth by retraction of the orogastric catheter and wire.  A 20-French bumper retention gastrostomy tube was looped around the snare device.  It was then pulled back through the patient's mouth. The retention bumper was brought up to the anterior gastric wall. The T tack suture was cut at the skin.  The exiting gastrostomy tube was cut to appropriate length and a feeding adapter applied. The catheter was injected with contrast material to confirm position and a fluoroscopic spot image saved.  The tube was then flushed with saline.  A dressing was applied over the gastrostomy exit site.  Complications:  None  Findings:  Initial fluoroscopy demonstrates adequate opacification of the colon by ingested barium in order to prevent colonic injury during the procedure.  The stomach distended well with air allowing safe placement of the gastrostomy tube.  After placement, the tip of the gastrostomy tube lies in the body of the stomach.  IMPRESSION: Percutaneous gastrostomy with placement of a 20-French bumper retention tube in the body of the stomach.  This tube can be used for percutaneous feeds beginning in 24 hours after placement.   Original Report Authenticated By: Reola Calkins, M.D.     Anti-infectives: Anti-infectives     Start     Dose/Rate Route Frequency Ordered Stop   12/26/11 1530   ceFAZolin (ANCEF) IVPB 1 g/50 mL premix        1 g 100 mL/hr over 30 Minutes Intravenous  Once 12/26/11 1517 12/26/11 1549   12/26/11 1510   ceFAZolin (ANCEF) 1-5 GM-% IVPB  Status:  Discontinued     Comments: KIRKMAN, JENNIFER: cabinet override         12/26/11 1510 12/26/11 1519   12/26/11 1200   ceFAZolin (ANCEF) IVPB 1 g/50 mL premix        1 g 100 mL/hr over 30 Minutes Intravenous On call 12/25/11 1642 12/26/11 1202   12/19/11 1800   levofloxacin (LEVAQUIN) IVPB 750 mg  Status:  Discontinued        750 mg 100 mL/hr  over 90 Minutes Intravenous Every 24 hours 12/19/11 1512 12/25/11 0850   12/18/11 1400   fluconazole (DIFLUCAN) IVPB 200 mg        200 mg 100 mL/hr over 60 Minutes Intravenous Every 24 hours 12/18/11 1110     12/17/11 2000   vancomycin (VANCOCIN) 750 mg in sodium chloride 0.9 % 150 mL IVPB  Status:  Discontinued        750 mg 150 mL/hr over 60 Minutes Intravenous Every 12 hours 12/17/11 1914 12/19/11 1444   12/17/11 2000   piperacillin-tazobactam (ZOSYN) IVPB 3.375 g  Status:  Discontinued        3.375 g 12.5 mL/hr over 240 Minutes Intravenous Every 8 hours 12/17/11 1914 12/19/11 1444   12/13/11 1400   fluconazole (DIFLUCAN) IVPB 100 mg  Status:  Discontinued  100 mg 50 mL/hr over 60 Minutes Intravenous Every 24 hours 12/13/11 1211 12/18/11 1110          Assessment/Plan: s/p perc gastrostomy tube placement 10/29. Ok to use G tube when needed .Keep outer disc taut to skin to prevent leakage.   LOS: 15 days    Neely Cecena,D [facility] 12/27/2011

## 2011-12-27 NOTE — Progress Notes (Signed)
Occupational Therapy Treatment Patient Details Name: Justin Pittman MRN: [id number] DOB: 20-Apr-1932 Today's Date: 12/27/2011 Time: 7829-5621 OT Time Calculation (min): 11 min  OT Assessment / Plan / Recommendation Comments on Treatment Session Pt required max encouragement just to get OOB and sit in chair. Limited by fatigue and pain from PEG placement yesterday.     Follow Up Recommendations  Home health OT;Supervision/Assistance - 24 hour    Barriers to Discharge       Equipment Recommendations  Rolling walker with 5" wheels;3 in 1 bedside comode    Recommendations for Other Services    Frequency Min 2X/week   Plan Discharge plan remains appropriate    Precautions / Restrictions Precautions Precautions: Fall   Pertinent Vitals/Pain Pt reported pain in abdomen which he did not rate. Repositioned for comfort.    ADL  Toilet Transfer: Simulated;Minimal assistance Toilet Transfer Method: Stand pivot Toilet Transfer Equipment: Other (comment) (to recliner.) ADL Comments: Pt only agreeable to SPT to chair. Pt too fatigued to don socks. Pt also refused to attempt to take a few steps.    OT Diagnosis:    OT Problem List:   OT Treatment Interventions:     OT Goals ADL Goals ADL Goal: Toilet Transfer - Progress: Progressing toward goals Miscellaneous OT Goals OT Goal: Miscellaneous Goal #2 - Progress: Progressing toward goals  Visit Information  Last OT Received On: 12/27/11 Assistance Needed: +1 PT/OT Co-Evaluation/Treatment: Yes    Subjective Data  Subjective: I just don't feel like I can do anything right now.   Prior Functioning       Cognition  Overall Cognitive Status: Appears within functional limits for tasks assessed/performed Arousal/Alertness: Awake/alert Orientation Level: Appears intact for tasks assessed Behavior During Session: Lethargic    Mobility  Shoulder Instructions Bed Mobility Bed Mobility: Supine to Sit Supine to Sit: 4: Min guard;HOB  elevated;With rails Details for Bed Mobility Assistance: moves slowly and requires encouragement. Transfers Sit to Stand: With upper extremity assist;From bed;4: Min assist Stand to Sit: 4: Min guard;With upper extremity assist;To chair/3-in-1;With armrests Details for Transfer Assistance: Min VCs for hand placement and technique manipulating RW.       Exercises  Other Exercises Other Exercises: encouraged ankle pumps and quad sets, pt completes 5 reps each bil   Balance Static Sitting Balance Static Sitting - Balance Support: No upper extremity supported;Feet supported Static Sitting - Level of Assistance: 6: Modified independent (Device/Increase time)   End of Session OT - End of Session Activity Tolerance: Patient limited by fatigue;Patient limited by pain Patient left: in chair;with call bell/phone within reach  GO     Dannielle Baskins A OTR/L [phone] 12/27/2011, 9:05 AM

## 2011-12-28 ENCOUNTER — Telehealth: Payer: Self-pay | Admitting: Medical Oncology

## 2011-12-28 ENCOUNTER — Ambulatory Visit: Payer: Medicare Other

## 2011-12-28 DIAGNOSIS — J690 Pneumonitis due to inhalation of food and vomit: Secondary | ICD-10-CM

## 2011-12-28 LAB — COMPREHENSIVE METABOLIC PANEL
ALT: 30 U/L (ref 0–53)
AST: 20 U/L (ref 0–37)
Albumin: 2.5 g/dL — ABNORMAL LOW (ref 3.5–5.2)
Alkaline Phosphatase: 116 U/L (ref 39–117)
Calcium: 8.7 mg/dL (ref 8.4–10.5)
GFR calc Af Amer: >90 mL/min (ref >90)
Glucose, Bld: 116 mg/dL — ABNORMAL HIGH (ref 70–99)
Potassium: 3.5 mEq/L (ref 3.5–5.1)
Sodium: 133 mEq/L — ABNORMAL LOW (ref 135–145)
Total Protein: 5.9 g/dL — ABNORMAL LOW (ref 6.0–8.3)

## 2011-12-28 LAB — GLUCOSE, CAPILLARY
Glucose-Capillary: 129 mg/dL — ABNORMAL HIGH (ref 70–99)
Glucose-Capillary: 128 mg/dL — ABNORMAL HIGH (ref 70–99)

## 2011-12-28 MED ORDER — POLYETHYLENE GLYCOL 3350 17 G PO PACK: 17.0000 g | PACK | Freq: Every day | ORAL | Status: DC

## 2011-12-28 MED ORDER — TAMSULOSIN HCL 0.4 MG PO CAPS: 0.4000 mg | ORAL_CAPSULE | Freq: Every day | ORAL | Status: DC

## 2011-12-28 MED ORDER — ONDANSETRON HCL 4 MG PO TABS: 4.0000 mg | ORAL_TABLET | Freq: Four times a day (QID) | ORAL | Status: DC | PRN

## 2011-12-28 MED ORDER — ACETAMINOPHEN 650 MG RE SUPP: 650.0000 mg | Freq: Four times a day (QID) | RECTAL | Status: DC | PRN

## 2011-12-28 MED ORDER — SENNOSIDES-DOCUSATE SODIUM 8.6-50 MG PO TABS: 1.0000 | ORAL_TABLET | Freq: Every day | ORAL | Status: DC | PRN

## 2011-12-28 MED ORDER — PANTOPRAZOLE SODIUM 40 MG PO TBEC: 40.0000 mg | DELAYED_RELEASE_TABLET | Freq: Two times a day (BID) | ORAL | Status: DC

## 2011-12-28 MED ORDER — FLUCONAZOLE 100 MG PO TABS: 200.0000 mg | ORAL_TABLET | Freq: Every day | ORAL | Status: DC

## 2011-12-28 MED ORDER — MORPHINE SULFATE (CONCENTRATE) 20 MG/ML PO SOLN: 10.0000 mg | ORAL | Status: DC | PRN

## 2011-12-28 MED ORDER — FREE WATER: 60.0000 mL | Freq: Every day | Status: DC

## 2011-12-28 MED ORDER — OSMOLITE 1.5 CAL PO LIQD: 237.0000 mL | ORAL | Status: DC

## 2011-12-28 MED ORDER — OSMOLITE 1.5 CAL PO LIQD
237.0000 mL | ORAL | Status: DC
  Administered 2011-12-28: 237 mL
  Filled 2011-12-28 (×8): qty 237

## 2011-12-28 MED ORDER — GUAIFENESIN-DM 100-10 MG/5ML PO SYRP: 5.0000 mL | ORAL_SOLUTION | ORAL | Status: DC | PRN

## 2011-12-28 NOTE — Telephone Encounter (Signed)
Does pt need f/u appointment with Dr Arbutus Ped. If so when

## 2011-12-28 NOTE — Progress Notes (Signed)
Clinical Social Worker spoke with pt wife this morning via telephone about pt family decision about SNF. Pt wife states that pt family has chosen [facility]. Clinical Social Worker discussed process of transfer to [facility] with pt wife and clarified pt wife questions. Pt wife expressed understanding of discharge today and stated that she will likely meet pt at [facility] depending on when pt ready for transfer. Pt wife agreeable to this Clinical Social Worker keeping pt wife updated via telephone. Clinical Social Worker faxed pt discharge information to [facility] and awaiting response from facility that facility has everything needed for tube feeds. Clinical Social Worker to facilitate pt discharge needs this afternoon.  Jacklynn Lewis, MSW, LCSWA  Clinical Social Work [phone]

## 2011-12-28 NOTE — Plan of Care (Signed)
Problem: Phase III Progression Outcomes Goal: Activity at appropriate level-compared to baseline (UP IN CHAIR FOR HEMODIALYSIS)  Outcome: Not Applicable Date Met:  12/28/11 Transfer to SNF

## 2011-12-28 NOTE — Progress Notes (Signed)
PARENTERAL NUTRITION CONSULT NOTE - FOLLOW UP  Pharmacy Consult for TNA  Indication: Poor oral intake 2/2 radiation esophagitis  No Known Allergies  Patient Measurements: Height: 5\' 10"  (177.8 cm) Weight: 135 lb 12.8 oz (61.598 kg) IBW/kg (Calculated) : 73  Usual Weight: 71 kg   Vital Signs: Temp: 97.7 F (36.5 C) (10/31 0520) Temp src: Oral (10/31 0520) BP: 102/55 mmHg (10/31 0520) Pulse Rate: 93  (10/31 0520) Intake/Output from previous day: 10/30 0701 - 10/31 0700 In: 260 [I.V.:60; TPN:200] Out: 1125 [Urine:1125] Intake/Output from this shift: Total I/O In: 20 [I.V.:20] Out: 925 [Urine:925]  Labs:  [facility] 12/26/11 0615  WBC 3.9*  HGB 8.1*  HCT 23.6*  PLT 157  APTT 31  INR 1.02     Basename 12/28/11 0424 12/26/11 0615  NA 133* 132*  K 3.5 3.7  CL 96 95*  CO2 28 29  GLUCOSE 116* 128*  BUN 22 24*  CREATININE 0.88 0.92  LABCREA -- --  CREAT24HRUR -- --  CALCIUM 8.7 8.7  MG 1.8 --  PHOS 3.5 --  PROT 5.9* --  ALBUMIN 2.5* --  AST 20 --  ALT 30 --  ALKPHOS 116 --  BILITOT 0.2* --  BILIDIR -- --  IBILI -- --  PREALBUMIN -- --  TRIG -- --  CHOLHDL -- --  CHOL -- --   Estimated Creatinine Clearance: 61.3 ml/min (by C-G formula based on Cr of 0.88).   Insulin Requirements in the past 24 hours:  CBGs 129-142 Required 6 units moderate SSI Q8h  Nutritional Goals RD rec 10/16:1950-2300 kcal, 85-100g protein Clinimix E5/20 at a goal rate of 80 ml/hr with lipids MWF will provide 96 g protein daily and 1689 kcal TTSS, 2169 kcal MWF, and an average of 1896 kcal/day.   10/29: New Recs for tube feed per RD: "bolus tube feeds of Osmolite 1.5, start at 1/2 can q4hrs tomorrow then 1 can q4hrs the following day for goal of 6 cans/day. This will provide 2130 calories, 89g protein, free water, meeting 109% estimated calorie needs and 105% estimated protein needs. Recommend 60ml free water flushes before and after each bolus and an additional water  flushes TID if IVF d/c"    Current Nutrition Clinimix E5/15 @ 30 ml/hr Tube Feeds: Osmolite 1.5 1/2 can every 4 hours started yesterday - patient has been tolerating tube feeds Dyphagia 3 diet resumed 10/30 IVF: NS @ 10 ml/hr  Asssessment: 79 yom with h/o stage IIIB NSCLC s/p concurrent chemoradiation for 6 weeks (carboplatin/paclitaxel) with last doses given 12/05/11. Patient presented 12/12/2011 for intractable nausea and vomiting associated with dysphagia and thrush secondary to radiation induced esophagitis. TNA started 10/16 for poor oral intake and protein calorie malnutrition. Chemo and radiation currently on hold until improvement in condition. Spouse stated no further Rad/Onc for patient. PEG tube to be placed today.  Labs: Renal function: Scr stable/wnl (10/29) Hepatic function: LFTs WNL Electrolytes: Na+ remains low (cannot be adjusted in TNA), Mag wnl, Phos wnl on KPhos 250mg  PO daily Pre-Albumin: 10.8 (10/17), 5.6 (10/21), 13 (10/28) TG/Cholesterol: wnl 10/17, 10/21, 10/28  Plan:    Continue TNA at 30 ml/hr until 1800 tonight then d/c.  Continue fats at 10 ml/hr until 1800 tonight then d/c.  Patient tolerating tube feeds, and to continue tube feed per RD.  D/C TNA labs  Will continue SSI Q8 hours for now as patient is receiving tube feeds, and will defer to MD to discontinue  Pharmacy will sign off,  please re-consult if needed.  Tammy Sours, Pharm.D. Clinical Oncology Pharmacist  Pager # [phone]  12/28/2011 6:16 AM

## 2011-12-28 NOTE — Progress Notes (Signed)
Reviewed discharge instructions, meds, f/u appts, when to call MD with pt's wife over phone. Verbalized understanding and asked appropriate questions.

## 2011-12-28 NOTE — Progress Notes (Signed)
Was informed by SW that wife stated she left her purple cane at hospital during one of their phone conversations.  Room inspected, cane not present.  Son, Thayer Ohm, called for pt update and informed him of pt's wife statement regarding cane.  Son stated wife was wheeled down from pt's room via wheelchair and probably left cane at front desk in lobby, but "he wasn't worried about it."  Nurse tech also confirmed she witnessed pt's wife took cane when leaving floor.

## 2011-12-28 NOTE — Progress Notes (Signed)
Nutrition Brief Note  - Met with pt who was alone in his room this morning. Pt has so far received 3 cans of TF, given as 1/2 can TF bolus started yesterday.  Pt reports tolerating TF well, denies any nausea/diarrhea. Pt denied having any questions regarding his nutrition. Nursing reports pt without any TF residuals. Noted plan to d/c TPN later on today. Pt started on 1 can of Osmolite 1.5 this morning with goal of 6 cans/day, ordered 1 can q4hrs with 60ml water flushes before and after each bolus TF. Will continue to be available for any further nutrition concerns.   Levon Hedger MS, RD, LDN [phone] Pager [phone] After Hours Pager

## 2011-12-28 NOTE — Progress Notes (Signed)
Spoke with Darl Pikes, RN at [facility] and gave report, reviewed discharge instructions, med administration times.  Pt's PICC line removed, remained in bed x23mins, site clean, dry, intact.  PEG site unremarkable, dsg changed. Tolerated full can of Osmolite. No residual noted. Pt's belongings packed, tech assisting to dress pt. Pt to be sent via ambulance

## 2011-12-28 NOTE — Discharge Summary (Signed)
Physician Discharge Summary  Carrel Leather ZOX:[id number] DOB: [birth year] DOA: 12/12/2011  PCP: [name], MD  Admit date: 12/12/2011 Discharge date: 12/28/2011  Discharge Diagnoses:  Principal Problem:  *Radiation esophagitis Active Problems:  Pancytopenia  Protein calorie malnutrition  Non-small cell lung cancer  Anemia of chronic disease  Hypertension  Aspiration pneumonia  Hypokalemia  Hypomagnesemia  Discharge Condition: medically stable for discharge to SNF today  Diet recommendation: as tolerated dysphagia 3 diet; PEG tube feeds  History of present illness:  78 year old male with a past medical history of stage IIIB non-small cell lung cancer, squamous cell carcinoma with right lower lobe lung mass as well as left hilar lymphadenopathy (diagnosed in August of 2013) who has been receiving chemoradiation with weekly carboplatin and paclitaxel, status post 6 weekly doses of treatment last dose was given on 12/05/2011. Patient was admitted 12/12/2011 with intractable nausea and vomiting and radiation esophagitis and oral thrush. Hospital course was complicated by the development of possible aspiration pneumonia, for which he has been treated with a 7 day course of Levaquin. Patient was receiving TNA for nutritional support. Patient now has PEG tube and plan is for discharge to SNF today.   Assessment/Plan:   Principal Problem:  *Radiation esophagitis in the setting of non small cell lung carcinoma status post radiation therapy  Continue fluconazole 200 mg daily, Protonix 40 mg PO twice daily.  Status post PEG tube placement 12/26/2011 and feeds initiated; tolerating feeds well  Active Problems:  Urinary Retention  Patient required foley catheter for urinary retention  This has improved with Flomax  Subsequent bladder scans showed no retention. Aspiration pneumonia  Patient has completed 7 days of Levaquin  Non-small cell lung cancer  Seen by Dr. Arbutus Ped on  12/18/2011. Family meeting held with wife and Dr. Arbutus Ped on 12/25/11.  Further radiation and chemotherapy on hold; Family does not want to proceed with further radiation treatments. Dr. Arbutus Ped favors no further XRT.  Patient's wife declined palliative care consultation for goals of care Pancytopenia / Anemia of chronic disease  Likely secondary to sequela of chemotherapy.  Status post of 1 unit PRBC transfusion while in hospital Hypokalemia/ Hypomagnesemia  repleted Protein calorie malnutrition  Secondary to malignancy and poor oral intake due to radiation esophagitis.  Status post PEG tube placement 12/26/2011  Continue dysphagia 3 diet as tolerated. Hypertension  Blood pressure at goal, 102/55  Code Status: Full  Family Communication: family not at bedside  Disposition Plan: SNF today  Medical Consultants:  Dr. Si Gaul, Oncology  Billie Ruddy, PA, physical medicine and rehabilitation  Dr. Irish Lack, IR Other Consultants:  Physical therapy: Recommend SNF.  Dietitian: Ongoing inadequate oral intake.  Pharmacy: TNA. and antibiotic dosing. Procedures:  PICC placement 12/13/2011.  PEG placement 12/26/2011 Antibiotics:  Levaquin 12/19/2011--->12/25/11  Diflucan 12/13/2011---> continue on discharge 200 mg daily Vancomycin 12/17/2011---> 12/19/2011  Zosyn 12/17/2011---> 12/19/2011  Manson Passey, MD  [facility]  Pager [phone]  Discharge Exam: Filed Vitals:   12/28/11 0520  BP: 102/55  Pulse: 93  Temp: 97.7 F (36.5 C)  Resp: 16   Filed Vitals:   12/27/11 0547 12/27/11 1510 12/28/11 0012 12/28/11 0520  BP: 123/66 110/60 107/56 102/55  Pulse: 78 95 88 93  Temp: 98.4 F (36.9 C) 97.7 F (36.5 C) 97.7 F (36.5 C) 97.7 F (36.5 C)  TempSrc: Oral Oral Oral Oral  Resp: 15 16 16 16   Height:      Weight:      SpO2: 98% 96%  98% 99%    General: Pt is alert, not in acute distress Cardiovascular: Regular rate and rhythm, S1/S2 +, no murmurs, no rubs, no  gallops Respiratory: Clear to auscultation bilaterally, no wheezing, no crackles, no rhonchi Abdominal: Soft, non tender, non distended, bowel sounds +, no guarding Extremities: no edema, no cyanosis, pulses palpable bilaterally DP and PT Neuro: Grossly nonfocal  Discharge Instructions  Discharge Orders    Future Appointments: Provider: Department: Dept Phone: Center:   01/22/2012 9:30 AM Rollene Rotunda, MD [facility] [phone] [facility]     Future Orders Please Complete By Expires   Diet - low sodium heart healthy      Increase activity slowly      Call MD for:  persistant nausea and vomiting      Call MD for:  severe uncontrolled pain      Call MD for:  difficulty breathing, headache or visual disturbances      Call MD for:  persistant dizziness or light-headedness          Medication List     As of 12/28/2011  9:11 AM    STOP taking these medications         famotidine 20 MG tablet   Commonly known as: PEPCID      omeprazole 20 MG capsule   Commonly known as: PRILOSEC      TAKE these medications         acetaminophen 650 MG suppository   Commonly known as: TYLENOL   Place 1 suppository (650 mg total) rectally every 6 (six) hours as needed for fever or pain.      feeding supplement (OSMOLITE 1.5 CAL) Liqd   Place 237 mLs into feeding tube every 4 (four) hours.      fluconazole 100 MG tablet   Commonly known as: DIFLUCAN   Take 2 tablets (200 mg total) by mouth daily.      free water Soln   Place 60 mLs into feeding tube daily.      guaiFENesin-dextromethorphan 100-10 MG/5ML syrup   Commonly known as: ROBITUSSIN DM   Take 5 mLs by mouth every 4 (four) hours as needed for cough.      KLOR-CON M10 10 MEQ tablet   Generic drug: potassium chloride   Take 1 tablet by mouth daily.      morphine 20 MG/ML concentrated solution   Commonly known as: ROXANOL   Take 0.5 mLs (10 mg total) by mouth every 2 (two) hours as needed for pain.       ondansetron 4 MG tablet   Commonly known as: ZOFRAN   Take 1 tablet (4 mg total) by mouth every 6 (six) hours as needed for nausea.      pantoprazole 40 MG tablet   Commonly known as: PROTONIX   Take 1 tablet (40 mg total) by mouth 2 (two) times daily.      polyethylene glycol packet   Commonly known as: MIRALAX / GLYCOLAX   Take 17 g by mouth daily.      prochlorperazine 10 MG tablet   Commonly known as: COMPAZINE   Take 10 mg by mouth every 6 (six) hours as needed. nausea      senna-docusate 8.6-50 MG per tablet   Commonly known as: Senokot-S   Take 1 tablet by mouth daily as needed for constipation.      sucralfate 1 G tablet   Commonly known as: CARAFATE   Take 1 g by mouth 4 (four)  times daily. Dissolve 1 tab. In water qid ac 5 min. Before food prn discomfort.Disp. # 60 w/ 3 refills  Called into [facility] on [address].      Tamsulosin HCl 0.4 MG Caps   Commonly known as: FLOMAX   Take 1 capsule (0.4 mg total) by mouth daily.           Follow-up Information    Follow up with [name], MD. In 2 weeks.   Contact information:   [address] [facility] [place] [address] [phone]           The results of significant diagnostics from this hospitalization (including imaging, microbiology, ancillary and laboratory) are listed below for reference.    Significant Diagnostic Studies: Dg Chest 2 View 12/12/2011  * IMPRESSION: Decreased size of the right perihilar mass with question of necrosis /cavitation.  Otherwise, no acute change identified.   Original Report Authenticated By: Waneta Martins, M.D.    Dg Abd 1 View 12/26/2011  * IMPRESSION: Contrast in large and small bowel loops to the level of the distal transverse colon.   Original Report Authenticated By: Consuello Bossier, M.D.    Ir Gastrostomy Tube Mod Sed 12/26/2011  * IMPRESSION: Percutaneous gastrostomy with placement of a 20-French bumper retention tube in  the body of the stomach.  This tube can be used for percutaneous feeds beginning in 24 hours after placement.   Original Report Authenticated By: Reola Calkins, M.D.    Dg Chest Port 1 View 12/17/2011  * IMPRESSION: No acute disease in the chest.   Original Report Authenticated By: Gwynn Burly, M.D.     Microbiology: No results found for this or any previous visit (from the past 240 hour(s)).   Labs: Basic Metabolic Panel:  Lab 12/28/11 4259 12/26/11 0615 12/25/11 0600 12/23/11 0624  NA 133* 132* 132* 132*  K 3.5 3.7 4.3 4.1  CL 96 95* 95* 96  CO2 28 29 27 29   GLUCOSE 116* 128* 95 129*  BUN 22 24* 27* 24*  CREATININE 0.88 0.92 0.92 0.92  CALCIUM 8.7 8.7 9.1 8.8  MG 1.8 -- 1.6 1.7  PHOS 3.5 -- 4.2 --   Liver Function Tests:  Lab 12/28/11 0424 12/25/11 0600  AST 20 39*  ALT 30 53  ALKPHOS 116 135*  BILITOT 0.2* 0.6  PROT 5.9* 6.2  ALBUMIN 2.5* 2.7*   CBC:  Lab 12/26/11 0615 12/25/11 0600 12/23/11 0624  WBC 3.9* 5.0 4.4  HGB 8.1* 9.0* 8.3*  HCT 23.6* 26.2* 24.4*  MCV 80.8 80.4 81.3  PLT 157 188 145*   CBG:  Lab 12/28/11 0826 12/28/11 0011 12/27/11 1656 12/27/11 0757 12/27/11  GLUCAP 128* 129* 145* 135* 142*    Time coordinating discharge: Over 30 minutes  Signed:  Manson Passey, MD  [facility] 12/28/2011, 9:12 AM  Pager #: [phone]

## 2011-12-28 NOTE — Progress Notes (Signed)
Clinical Social Worker received notification from [facility] that facility had everything needed for pt tube feeds and pt could transfer to facility. Clinical Social Worker spoke with pt RN who stated that pt next tube feed is not scheduled until 4 pm and pt stable for transfer to facility. Clinical Social Worker contacted pt wife via telephone and discussed the above. Pt wife agreeable to transfer to [facility] in order for pt to get settled at facility and have tube feed at facility at 4pm. Clinical Social Worker discussed process of transfer via non-emergency ambulance and explained to pt wife that ambulance company files transport with pt insurance company, but cannot guarantee coverage. Pt wife expressed understanding. Clinical Social Worker facilitated pt discharge needs including discussion with facility, faxing of pt discharge information, confirming RN had called report to [facility], discussing with pt at bedside and pt wife via telephone, and arranging ambulance transportation for pt to [facility]. No further social work needs identified at this time. Clinical Social Worker signing off.   Jacklynn Lewis, MSW, LCSWA  Clinical Social Work [phone]

## 2011-12-29 ENCOUNTER — Telehealth: Payer: Self-pay | Admitting: Medical Oncology

## 2011-12-29 ENCOUNTER — Ambulatory Visit: Payer: Medicare Other

## 2011-12-29 DIAGNOSIS — J690 Pneumonitis due to inhalation of food and vomit: Secondary | ICD-10-CM

## 2011-12-29 DIAGNOSIS — K208 Other esophagitis without bleeding: Secondary | ICD-10-CM

## 2011-12-29 DIAGNOSIS — D61818 Other pancytopenia: Secondary | ICD-10-CM

## 2011-12-29 DIAGNOSIS — D638 Anemia in other chronic diseases classified elsewhere: Secondary | ICD-10-CM

## 2011-12-29 DIAGNOSIS — C3490 Malignant neoplasm of unspecified part of unspecified bronchus or lung: Secondary | ICD-10-CM

## 2011-12-29 DIAGNOSIS — E876 Hypokalemia: Secondary | ICD-10-CM

## 2011-12-29 HISTORY — DX: Anemia in other chronic diseases classified elsewhere: D63.8

## 2011-12-29 HISTORY — DX: Malignant neoplasm of unspecified part of unspecified bronchus or lung: C34.90

## 2011-12-29 HISTORY — DX: Hypokalemia: E87.6

## 2011-12-29 HISTORY — DX: Pneumonitis due to inhalation of food and vomit: J69.0

## 2011-12-29 HISTORY — DX: Other esophagitis without bleeding: K20.80

## 2011-12-29 HISTORY — DX: Other pancytopenia: D61.818

## 2011-12-29 NOTE — Telephone Encounter (Signed)
Wife concerned that pt will not try to eat anything by mouth.  I told her I will follow up with Dr Arbutus Ped about the appetite stimulant and scans , but for now he needs the tube feedings and to follow recommendations of dietician . She  wants to know when he will be scanned ?

## 2011-12-30 NOTE — Telephone Encounter (Signed)
In 4 weeks

## 2012-01-01 DIAGNOSIS — B370 Candidal stomatitis: Secondary | ICD-10-CM

## 2012-01-01 DIAGNOSIS — G4700 Insomnia, unspecified: Secondary | ICD-10-CM

## 2012-01-01 HISTORY — DX: Candidal stomatitis: B37.0

## 2012-01-01 HISTORY — DX: Insomnia, unspecified: G47.00

## 2012-01-03 ENCOUNTER — Telehealth: Payer: Self-pay | Admitting: *Deleted

## 2012-01-03 ENCOUNTER — Other Ambulatory Visit: Payer: Self-pay | Admitting: *Deleted

## 2012-01-03 ENCOUNTER — Telehealth: Payer: Self-pay | Admitting: Internal Medicine

## 2012-01-03 DIAGNOSIS — C3490 Malignant neoplasm of unspecified part of unspecified bronchus or lung: Secondary | ICD-10-CM

## 2012-01-03 NOTE — Telephone Encounter (Signed)
Spoke with [facility]'s nurse regarding appt for pt.  Dr. Arbutus Ped aware

## 2012-01-03 NOTE — Telephone Encounter (Signed)
Rec'd email from [facility] to schedule pt.  Pt schedule 12/02 @ 9:30 Welcome packet mailed.

## 2012-01-03 NOTE — Progress Notes (Signed)
Per Dr Donnald Garre, pt needs lab/ CT and f/u with [facility] in 4 weeks.  Onc tx schedule filled out.  Pt's wife aware that scheduler will be in touch.  Pt concerned about husband being at [facility] and she cannot afford it.  Informed her to discuss concerns with [facility] regarding this.  SLJ

## 2012-01-04 ENCOUNTER — Telehealth: Payer: Self-pay | Admitting: *Deleted

## 2012-01-04 ENCOUNTER — Other Ambulatory Visit: Payer: Self-pay | Admitting: *Deleted

## 2012-01-04 NOTE — Telephone Encounter (Signed)
Patient's wife confirmed over the phone the new date and time inform the patient's wife that central scheduling would be calling her with the date and time of the scan

## 2012-01-15 ENCOUNTER — Encounter: Payer: Self-pay | Admitting: Radiation Oncology

## 2012-01-15 NOTE — Progress Notes (Signed)
  Radiation Oncology         [phone]) [phone] ________________________________  Name: Justin Pittman MRN: [id number]  Date: 10/20/2011  DOB: November 15, 1932  SPECIAL TREATMENT PROCEDURE NOTE  NARRATIVE:  The planned course of therapy using radiation constitutes a special treatment procedure. Special care is required in the management of this patient for the following reasons.  I have requested : This treatment constitutes a Special Treatment Procedure for the following reason: [ Concurrent chemotherapy requiring careful monitoring for increased toxicities of treatment including weekly laboratory values..   The special nature of the planned course of radiotherapy will require increased physician supervision and oversight to ensure patient's safety with optimal treatment outcomes. ________________________________  Artist Pais Kathrynn Running, M.D.

## 2012-01-17 DIAGNOSIS — R000 Tachycardia, unspecified: Secondary | ICD-10-CM

## 2012-01-17 HISTORY — DX: Tachycardia, unspecified: R00.0

## 2012-01-22 ENCOUNTER — Ambulatory Visit: Payer: Medicare Other | Admitting: Cardiology

## 2012-01-24 ENCOUNTER — Ambulatory Visit: Payer: Medicare Other | Admitting: Cardiology

## 2012-01-29 ENCOUNTER — Ambulatory Visit: Payer: Medicare Other

## 2012-01-29 ENCOUNTER — Other Ambulatory Visit: Payer: Medicare Other | Admitting: Lab

## 2012-01-29 ENCOUNTER — Ambulatory Visit: Payer: Medicare Other | Admitting: Internal Medicine

## 2012-01-30 ENCOUNTER — Ambulatory Visit (HOSPITAL_COMMUNITY)
Admission: RE | Admit: 2012-01-30 | Discharge: 2012-01-30 | Disposition: A | Discharge status: Home / Self Care | Payer: Medicare Other | Source: Ambulatory Visit | Attending: Internal Medicine | Admitting: Internal Medicine

## 2012-01-30 ENCOUNTER — Other Ambulatory Visit (HOSPITAL_BASED_OUTPATIENT_CLINIC_OR_DEPARTMENT_OTHER): Payer: Medicare Other

## 2012-01-30 DIAGNOSIS — Z923 Personal history of irradiation: Secondary | ICD-10-CM | POA: Insufficient documentation

## 2012-01-30 DIAGNOSIS — D649 Anemia, unspecified: Secondary | ICD-10-CM | POA: Insufficient documentation

## 2012-01-30 DIAGNOSIS — R05 Cough: Secondary | ICD-10-CM | POA: Insufficient documentation

## 2012-01-30 DIAGNOSIS — C3490 Malignant neoplasm of unspecified part of unspecified bronchus or lung: Secondary | ICD-10-CM | POA: Insufficient documentation

## 2012-01-30 DIAGNOSIS — J690 Pneumonitis due to inhalation of food and vomit: Secondary | ICD-10-CM | POA: Insufficient documentation

## 2012-01-30 DIAGNOSIS — D61818 Other pancytopenia: Secondary | ICD-10-CM | POA: Insufficient documentation

## 2012-01-30 DIAGNOSIS — R059 Cough, unspecified: Secondary | ICD-10-CM | POA: Insufficient documentation

## 2012-01-30 DIAGNOSIS — R634 Abnormal weight loss: Secondary | ICD-10-CM | POA: Insufficient documentation

## 2012-01-30 DIAGNOSIS — C3430 Malignant neoplasm of lower lobe, unspecified bronchus or lung: Secondary | ICD-10-CM

## 2012-01-30 DIAGNOSIS — Z9221 Personal history of antineoplastic chemotherapy: Secondary | ICD-10-CM | POA: Insufficient documentation

## 2012-01-30 LAB — CBC WITH DIFFERENTIAL/PLATELET
BASO%: 0.3 % (ref 0.0–2.0)
EOS%: 2.9 % (ref 0.0–7.0)
HCT: 30 % — ABNORMAL LOW (ref 38.4–49.9)
MCH: 29.4 pg (ref 27.2–33.4)
MCHC: 34.1 g/dL (ref 32.0–36.0)
MONO#: 0.8 10*3/uL (ref 0.1–0.9)
NEUT%: 66 % (ref 39.0–75.0)
RBC: 3.48 10*6/uL — ABNORMAL LOW (ref 4.20–5.82)
WBC: 5.8 10*3/uL (ref 4.0–10.3)
lymph#: 1 10*3/uL (ref 0.9–3.3)

## 2012-01-30 LAB — COMPREHENSIVE METABOLIC PANEL (CC13)
ALT: 14 U/L (ref 0–55)
AST: 19 U/L (ref 5–34)
CO2: 27 mEq/L (ref 22–29)
Calcium: 9.5 mg/dL (ref 8.4–10.4)
Chloride: 102 mEq/L (ref 98–107)
Creatinine: 1 mg/dL (ref 0.7–1.3)
Sodium: 140 mEq/L (ref 136–145)
Total Bilirubin: 0.35 mg/dL (ref 0.20–1.20)
Total Protein: 6.9 g/dL (ref 6.4–8.3)

## 2012-01-30 MED ORDER — IOHEXOL 300 MG/ML  SOLN
80.0000 mL | Freq: Once | INTRAMUSCULAR | Status: AC | PRN
  Administered 2012-01-30: 80 mL via INTRAVENOUS

## 2012-02-01 ENCOUNTER — Ambulatory Visit (HOSPITAL_BASED_OUTPATIENT_CLINIC_OR_DEPARTMENT_OTHER): Payer: Medicare Other | Admitting: Internal Medicine

## 2012-02-01 ENCOUNTER — Telehealth: Payer: Self-pay | Admitting: Internal Medicine

## 2012-02-01 VITALS — BP 123/72 | HR 93 | Temp 96.8°F | Resp 18 | Ht 70.0 in | Wt 133.2 lb

## 2012-02-01 DIAGNOSIS — C3430 Malignant neoplasm of lower lobe, unspecified bronchus or lung: Secondary | ICD-10-CM

## 2012-02-01 DIAGNOSIS — C3490 Malignant neoplasm of unspecified part of unspecified bronchus or lung: Secondary | ICD-10-CM

## 2012-02-01 NOTE — Patient Instructions (Signed)
Your scan showed significant improvement in your disease. Followup with observation and repeat CT scan of the chest in 3 months

## 2012-02-01 NOTE — Progress Notes (Signed)
[facility] Telephone:([phone]) [phone]   Fax:([phone]  OFFICE PROGRESS NOTE  [name], MD [address] [facility], [place]. [place] [address]  DIAGNOSIS: Stage IIIB non-small cell lung cancer, squamous cell carcinoma with right lower lobe lung mass as well as left hilar lymphadenopathy diagnosed in August of 2013.   PRIOR THERAPY:  Concurrent chemoradiation with weekly carboplatin for AUC of 2 and paclitaxel 45 mg/M2, last dose of chemotherapy was given 12/05/2011 with partial response.   CURRENT THERAPY: None.  INTERVAL HISTORY: Justin Pittman 78 y.o. male returns to the clinic today for followup visit accompanied by his wife and son. The patient is feeling much better today with no specific complaints. He still uses the gastric tube feeding for supplemental nutrition but he is able to eat and drink orally. He denied having any significant chest pain or shortness breath but continues to have dry cough. He denied having any hemoptysis. He was discharged home from the skilled nursing facility 2 weeks ago. The patient had repeat CT scan of the chest performed recently and he is here for evaluation and discussion of his scan results.  MEDICAL HISTORY: Past Medical History  Diagnosis Date  . HTN (hypertension)   . Lung cancer     Squamous Cell  . Vertigo 2010    Hospitalized a [facility]  . Hyperlipidemia   . Lung cancer     ALLERGIES:   has no known allergies.  MEDICATIONS:  Current Outpatient Prescriptions  Medication Sig Dispense Refill  . acetaminophen (TYLENOL) 650 MG suppository Place 1 suppository (650 mg total) rectally every 6 (six) hours as needed for fever or pain.  12 suppository  0  . fluconazole (DIFLUCAN) 100 MG tablet Take 2 tablets (200 mg total) by mouth daily.  30 tablet  0  . guaiFENesin-dextromethorphan (ROBITUSSIN DM) 100-10 MG/5ML syrup Take 5 mLs by mouth every 4 (four) hours as needed for cough.  118 mL  0  .  KLOR-CON M10 10 MEQ tablet Take 1 tablet by mouth daily.       Marland Kitchen morphine (ROXANOL) 20 MG/ML concentrated solution Take 0.5 mLs (10 mg total) by mouth every 2 (two) hours as needed for pain.  15 mL  0  . Nutritional Supplements (FEEDING SUPPLEMENT, OSMOLITE 1.5 CAL,) LIQD Place 237 mLs into feeding tube every 4 (four) hours.  1 Bottle  11  . ondansetron (ZOFRAN) 4 MG tablet Take 1 tablet (4 mg total) by mouth every 6 (six) hours as needed for nausea.  20 tablet  0  . pantoprazole (PROTONIX) 40 MG tablet Take 1 tablet (40 mg total) by mouth 2 (two) times daily.  60 tablet  0  . polyethylene glycol (MIRALAX / GLYCOLAX) packet Take 17 g by mouth daily.  14 each  0  . prochlorperazine (COMPAZINE) 10 MG tablet Take 10 mg by mouth every 6 (six) hours as needed. nausea      . senna-docusate (SENOKOT-S) 8.6-50 MG per tablet Take 1 tablet by mouth daily as needed for constipation.  60 tablet  0  . sucralfate (CARAFATE) 1 G tablet Take 1 g by mouth 4 (four) times daily. Dissolve 1 tab. In water qid ac 5 min. Before food prn discomfort.Disp. # 60 w/ 3 refills Called into [facility] on [address].      . Tamsulosin HCl (FLOMAX) 0.4 MG CAPS Take 1 capsule (0.4 mg total) by mouth daily.  30 capsule  0  . Water  For Irrigation, Sterile (FREE WATER) SOLN Place 60 mLs into feeding tube daily.  20 mL  5    SURGICAL HISTORY:  Past Surgical History  Procedure Date  . Tonsillectomy   . Video bronchoscopy 09/20/2011    Procedure: VIDEO BRONCHOSCOPY WITHOUT FLUORO;  Surgeon: Nyoka Cowden, MD;  Location: [facility];  Service: Cardiopulmonary;  Laterality: Bilateral;  . Tonsillectomy     REVIEW OF SYSTEMS:  A comprehensive review of systems was negative except for: Constitutional: positive for fatigue Respiratory: positive for cough   PHYSICAL EXAMINATION: General appearance: alert, cooperative and no distress Head: Normocephalic, without obvious abnormality, atraumatic Neck: no adenopathy Lymph nodes:  Cervical, supraclavicular, and axillary nodes normal. Resp: wheezes RUL Cardio: regular rate and rhythm, S1, S2 normal, no murmur, click, rub or gallop GI: soft, non-tender; bowel sounds normal; no masses,  no organomegaly Extremities: extremities normal, atraumatic, no cyanosis or edema Neurologic: Alert and oriented X 3, normal strength and tone. Normal symmetric reflexes. Normal coordination and gait  ECOG PERFORMANCE STATUS: 1 - Symptomatic but completely ambulatory  There were no vitals taken for this visit.  LABORATORY DATA: Lab Results  Component Value Date   WBC 5.8 01/30/2012   HGB 10.3* 01/30/2012   HCT 30.0* 01/30/2012   MCV 86.2 01/30/2012   PLT 290 01/30/2012      Chemistry      Component Value Date/Time   NA 140 01/30/2012 1132   NA 133* 12/28/2011 0424   K 4.3 01/30/2012 1132   K 3.5 12/28/2011 0424   CL 102 01/30/2012 1132   CL 96 12/28/2011 0424   CO2 27 01/30/2012 1132   CO2 28 12/28/2011 0424   BUN 23.0 01/30/2012 1132   BUN 22 12/28/2011 0424   CREATININE 1.0 01/30/2012 1132   CREATININE 0.88 12/28/2011 0424      Component Value Date/Time   CALCIUM 9.5 01/30/2012 1132   CALCIUM 8.7 12/28/2011 0424   ALKPHOS 149 01/30/2012 1132   ALKPHOS 116 12/28/2011 0424   AST 19 01/30/2012 1132   AST 20 12/28/2011 0424   ALT 14 01/30/2012 1132   ALT 30 12/28/2011 0424   BILITOT 0.35 01/30/2012 1132   BILITOT 0.2* 12/28/2011 0424       RADIOGRAPHIC STUDIES: Ct Chest W Contrast  01/30/2012  *RADIOLOGY REPORT*  Clinical Data: Lung cancer diagnosed in August.  Chemotherapy complete 12/05/2011.  Radiation therapy complete 10/13.  History radiation esophagitis.  Pancytopenia.  Anemia.  Aspiration pneumonia.  Cough and weight loss.  CT CHEST WITH CONTRAST  Technique:  Multidetector CT imaging of the chest was performed following the standard protocol during bolus administration of intravenous contrast.  Contrast: 80mL OMNIPAQUE IOHEXOL 300 MG/ML  SOLN  Comparison: Plain film of  12/17/2011.  Most recent CT chest 08/22/2011.  PET of 09/05/2011.  Findings: Lungs/pleura: Persistent mass effect upon the right lower lobe segmental bronchi, which appeared patent distally.  Biapical pleural parenchymal scarring.  Multifocal radiation change within the medial right lung.  Response to therapy of medial right lower lobe primary.  2.5 x 1.9 cm spiculated nodule remains on image 35/series 5.  This is decreased from 3.7 x 3.8 cm on the prior exam.  There is air within the superior posterior portion of this lesion on image 33/series 5. This is presumably related to necrosis.  Minimal nodularity more cephalad within the right lower lobe on image 27/series 5 which is new.  No pleural fluid.  Heart/Mediastinum: No supraclavicular adenopathy.  Focal dissection involving  the left subclavian artery coronal image 29/series 602. This is unchanged.  Extensive ulcerative plaque within the descending thoracic aorta. Normal heart size with minimal anterior pericardial fluid or thickening. No central pulmonary embolism, on this non-dedicated study.  Precarinal node measures 8 mm today versus 1.0 cm on the prior. Soft tissue density/nodal tissue within the right hilum is immediately cephalad the central nodule and upper normal in size. Similar to on the prior. 1.3 cm.  No left hilar adenopathy.  Upper abdomen: Gastrostomy tube.  Bilateral renal atrophy.  Normal imaged portions of adrenal glands.  Bones/Musculoskeletal:  Mild osteopenia. No acute osseous abnormality.  IMPRESSION:  1.  Response to therapy of central right lower lobe lung nodule. 2.  Presumed radiation changes throughout the medial right lung. 3.  Similar and decreased thoracic nodal size.  No new or progressive disease identified. 4.  Advanced coronary artery disease.  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 5.  Minimal superior segment right lower lobe nodularity which is nonspecific and warrants followup  attention.   Original Report Authenticated By: Jeronimo Greaves, M.D.     ASSESSMENT: This is a very pleasant 78 years old white male with history of stage IIIB non-small cell lung cancer, squamous cell carcinoma status post course of concurrent chemoradiation. The patient had significant improvement in his disease.  PLAN: I discussed the scan results and showed the images to the patient and his family. I recommended for him to continue on observation for now with repeat CT scan of the chest with contrast in 3 months. The patient is still recovering from the complication of the previous course of concurrent chemoradiation and I don't think he is a good candidate to start consolidation chemotherapy at this point. The patient and his family agreed to the current plan. He will continue using the gastric tube feeding for supplemental nutrition. He was advised to call immediately if he has any concerning symptoms in the interval..  All questions were answered. The patient knows to call the clinic with any problems, questions or concerns. We can certainly see the patient much sooner if necessary.  I spent 20 minutes counseling the patient face to face. The total time spent in the appointment was 30 minutes.

## 2012-02-01 NOTE — Telephone Encounter (Signed)
gv pt appt schedule for March 2014. Pt aware central will call with ct appt for March 2014.

## 2012-02-26 ENCOUNTER — Other Ambulatory Visit (HOSPITAL_COMMUNITY): Payer: Self-pay | Admitting: Interventional Radiology

## 2012-02-26 ENCOUNTER — Telehealth: Payer: Self-pay | Admitting: *Deleted

## 2012-02-26 DIAGNOSIS — R1310 Dysphagia, unspecified: Secondary | ICD-10-CM

## 2012-02-26 NOTE — Telephone Encounter (Signed)
Pt's wife called stating that the PEG tube site appears to be infected.  There is pus and appears to be bleeding some as well.  Per Dr Donnald Garre, pt needs to contact PCP or Dr Fredia Sorrow in IR.  Mrs. Gilliam verbalized understanding.  SLJ

## 2012-02-27 ENCOUNTER — Ambulatory Visit (HOSPITAL_COMMUNITY)
Admission: RE | Admit: 2012-02-27 | Discharge: 2012-02-27 | Disposition: A | Discharge status: Home / Self Care | Payer: Medicare Other | Source: Ambulatory Visit | Attending: Interventional Radiology | Admitting: Interventional Radiology

## 2012-02-27 DIAGNOSIS — R1310 Dysphagia, unspecified: Secondary | ICD-10-CM

## 2012-02-27 NOTE — Progress Notes (Signed)
Interventional Radiology Progress Note  I saw and evaluated Justin Pittman today.  His gastrostomy tube appears well cared for and in good condition.  He has some exuberant granulation tissue along the inferior margin of the tube entry site as well as some fibrinous exudate.  The skin is minimally erythematous but non tender and with no signs of fluctuance or induration.    I think the granulation tissue is leading to the occasional bleeding and fibrinous exudate is often confused for pus.  I see no evidence of infection.  Pt denies increased pain, fever and chills.  I explained the findings to [name] and his family and assured them that we would be happy to see him any time if he had any further problems.  He is also tolerating PO well now.  After his next visit with the oncologist this Spring, he may be able to have the tube removed if he needs no additional chemotherapy.  Signed,  Sterling Big, MD Vascular & Interventional Radiologist [facility]

## 2012-03-12 DIAGNOSIS — L080 Pyoderma: Secondary | ICD-10-CM

## 2012-03-12 HISTORY — DX: Pyoderma: L08.0

## 2012-04-22 ENCOUNTER — Telehealth: Payer: Self-pay | Admitting: *Deleted

## 2012-04-22 NOTE — Telephone Encounter (Signed)
Pt's wife called wanting to know when he can get his feeding tube removed.  Per Dr Donnald Garre, will discuss at next f/u visit after he is assessed.  She verbalized understanding.  SLJ

## 2012-04-30 ENCOUNTER — Ambulatory Visit (HOSPITAL_COMMUNITY): Payer: Medicare Other

## 2012-04-30 ENCOUNTER — Other Ambulatory Visit (HOSPITAL_BASED_OUTPATIENT_CLINIC_OR_DEPARTMENT_OTHER): Payer: Medicare Other

## 2012-04-30 ENCOUNTER — Encounter (HOSPITAL_COMMUNITY): Payer: Self-pay

## 2012-04-30 ENCOUNTER — Ambulatory Visit (HOSPITAL_COMMUNITY)
Admission: RE | Admit: 2012-04-30 | Discharge: 2012-04-30 | Disposition: A | Discharge status: Home / Self Care | Payer: Medicare Other | Source: Ambulatory Visit | Attending: Internal Medicine | Admitting: Internal Medicine

## 2012-04-30 DIAGNOSIS — C3430 Malignant neoplasm of lower lobe, unspecified bronchus or lung: Secondary | ICD-10-CM

## 2012-04-30 DIAGNOSIS — C3490 Malignant neoplasm of unspecified part of unspecified bronchus or lung: Secondary | ICD-10-CM | POA: Insufficient documentation

## 2012-04-30 DIAGNOSIS — R911 Solitary pulmonary nodule: Secondary | ICD-10-CM | POA: Insufficient documentation

## 2012-04-30 LAB — COMPREHENSIVE METABOLIC PANEL (CC13)
ALT: 15 U/L (ref 0–55)
AST: 20 U/L (ref 5–34)
Albumin: 3.6 g/dL (ref 3.5–5.0)
CO2: 29 mEq/L (ref 22–29)
Calcium: 9.3 mg/dL (ref 8.4–10.4)
Chloride: 103 mEq/L (ref 98–107)
Potassium: 4.1 mEq/L (ref 3.5–5.1)
Total Protein: 6.9 g/dL (ref 6.4–8.3)

## 2012-04-30 LAB — CBC WITH DIFFERENTIAL/PLATELET
BASO%: 0 % (ref 0.0–2.0)
Basophils Absolute: 0 10*3/uL (ref 0.0–0.1)
EOS%: 6.3 % (ref 0.0–7.0)
Eosinophils Absolute: 0.3 10*3/uL (ref 0.0–0.5)
HCT: 34.9 % — ABNORMAL LOW (ref 38.4–49.9)
HGB: 11.7 g/dL — ABNORMAL LOW (ref 13.0–17.1)
LYMPH%: 15.9 % (ref 14.0–49.0)
MCH: 28.5 pg (ref 27.2–33.4)
MCHC: 33.5 g/dL (ref 32.0–36.0)
MCV: 85.1 fL (ref 79.3–98.0)
MONO#: 0.5 10*3/uL (ref 0.1–0.9)
MONO%: 9.6 % (ref 0.0–14.0)
NEUT#: 3.5 10*3/uL (ref 1.5–6.5)
NEUT%: 68.2 % (ref 39.0–75.0)
Platelets: 198 10*3/uL (ref 140–400)
RBC: 4.1 10*6/uL — ABNORMAL LOW (ref 4.20–5.82)
RDW: 14.5 % (ref 11.0–14.6)
WBC: 5.1 10*3/uL (ref 4.0–10.3)
lymph#: 0.8 10*3/uL — ABNORMAL LOW (ref 0.9–3.3)

## 2012-04-30 MED ORDER — IOHEXOL 300 MG/ML  SOLN
100.0000 mL | Freq: Once | INTRAMUSCULAR | Status: AC | PRN
  Administered 2012-04-30: 80 mL via INTRAVENOUS

## 2012-05-02 ENCOUNTER — Telehealth: Payer: Self-pay | Admitting: Internal Medicine

## 2012-05-02 ENCOUNTER — Encounter: Payer: Self-pay | Admitting: Internal Medicine

## 2012-05-02 ENCOUNTER — Ambulatory Visit (HOSPITAL_BASED_OUTPATIENT_CLINIC_OR_DEPARTMENT_OTHER): Payer: Medicare Other | Admitting: Internal Medicine

## 2012-05-02 VITALS — BP 126/73 | HR 72 | Temp 97.1°F | Resp 18 | Ht 70.0 in | Wt 139.9 lb

## 2012-05-02 DIAGNOSIS — C3491 Malignant neoplasm of unspecified part of right bronchus or lung: Secondary | ICD-10-CM

## 2012-05-02 DIAGNOSIS — Z85118 Personal history of other malignant neoplasm of bronchus and lung: Secondary | ICD-10-CM

## 2012-05-02 NOTE — Progress Notes (Signed)
[facility] [phone]   [phone]  OFFICE PROGRESS NOTE  GREEN, Justin Curt, MD [address] [place] [address]  DIAGNOSIS: Stage IIIB non-small cell lung cancer, squamous cell carcinoma with right lower lobe lung mass as well as left hilar lymphadenopathy diagnosed in August of 2013.   PRIOR THERAPY: Concurrent chemoradiation with weekly carboplatin for AUC of 2 and paclitaxel 45 mg/M2, last dose of chemotherapy was given 12/05/2011 with partial response.   CURRENT THERAPY: None.   INTERVAL HISTORY: Justin Pittman 79 y.o. male returns to the clinic today for followup visit accompanied by his wife and son. The patient is feeling fine today with no specific complaints. He denied having any significant weight loss or night sweats. He has no chest pain, shortness breath, cough or hemoptysis. The patient currently eat regular meals and use only the PEG tube for supplemental nutrition usually 1 can per day. He denied having any significant nausea or vomiting. He would like to have the PEG tube removed. He has repeat CT scan of the chest performed recently and he is here today for evaluation and discussion of his scan results.  MEDICAL HISTORY: Past Medical History  Diagnosis Date  . HTN (hypertension)   . Lung cancer     Squamous Cell  . Vertigo 2010    Hospitalized a [facility]  . Hyperlipidemia   . Lung cancer     ALLERGIES:  has No Known Allergies.  MEDICATIONS:  Current Outpatient Prescriptions  Medication Sig Dispense Refill  . Nutritional Supplements (FEEDING SUPPLEMENT, OSMOLITE 1.5 CAL,) LIQD Place 237 mLs into feeding tube 2 (two) times daily.      . pantoprazole (PROTONIX) 40 MG tablet Take 1 tablet (40 mg total) by mouth 2 (two) times daily.  60 tablet  0  . Tamsulosin HCl (FLOMAX) 0.4 MG CAPS Take 1 capsule (0.4 mg total) by mouth daily.  30 capsule  0  . Water For Irrigation, Sterile (FREE WATER) SOLN Place 60 mLs into feeding  tube daily.  20 mL  5  . polyethylene glycol (MIRALAX / GLYCOLAX) packet Take 17 g by mouth daily.  14 each  0   No current facility-administered medications for this visit.    SURGICAL HISTORY:  Past Surgical History  Procedure Laterality Date  . Tonsillectomy    . Video bronchoscopy  09/20/2011    Procedure: VIDEO BRONCHOSCOPY WITHOUT FLUORO;  Surgeon: Nyoka Cowden, MD;  Location: [facility];  Service: Cardiopulmonary;  Laterality: Bilateral;  . Tonsillectomy      REVIEW OF SYSTEMS:  A comprehensive review of systems was negative.   PHYSICAL EXAMINATION: General appearance: alert, cooperative and no distress Head: Normocephalic, without obvious abnormality, atraumatic Neck: no adenopathy Lymph nodes: Cervical, supraclavicular, and axillary nodes normal. Resp: clear to auscultation bilaterally Cardio: regular rate and rhythm, S1, S2 normal, no murmur, click, rub or gallop GI: soft, non-tender; bowel sounds normal; no masses,  no organomegaly Extremities: extremities normal, atraumatic, no cyanosis or edema  ECOG PERFORMANCE STATUS: 1 - Symptomatic but completely ambulatory  Blood pressure 126/73, pulse 72, temperature 97.1 F (36.2 C), temperature source Oral, resp. rate 18, height 5\' 10"  (1.778 m), weight 139 lb 14.4 oz (63.458 kg).  LABORATORY DATA: Lab Results  Component Value Date   WBC 5.1 04/30/2012   HGB 11.7* 04/30/2012   HCT 34.9* 04/30/2012   MCV 85.1 04/30/2012   PLT 198 04/30/2012      Chemistry  Component Value Date/Time   NA 141 04/30/2012 1438   NA 133* 12/28/2011 0424   K 4.1 04/30/2012 1438   K 3.5 12/28/2011 0424   CL 103 04/30/2012 1438   CL 96 12/28/2011 0424   CO2 29 04/30/2012 1438   CO2 28 12/28/2011 0424   BUN 26.3* 04/30/2012 1438   BUN 22 12/28/2011 0424   CREATININE 1.2 04/30/2012 1438   CREATININE 0.88 12/28/2011 0424      Component Value Date/Time   CALCIUM 9.3 04/30/2012 1438   CALCIUM 8.7 12/28/2011 0424   ALKPHOS 179* 04/30/2012 1438    ALKPHOS 116 12/28/2011 0424   AST 20 04/30/2012 1438   AST 20 12/28/2011 0424   ALT 15 04/30/2012 1438   ALT 30 12/28/2011 0424   BILITOT 0.34 04/30/2012 1438   BILITOT 0.2* 12/28/2011 0424       RADIOGRAPHIC STUDIES: Ct Chest W Contrast  04/30/2012  *RADIOLOGY REPORT*  Clinical Data: History of lung cancer.  Restaging scan.  CT CHEST WITH CONTRAST  Technique:  Multidetector CT imaging of the chest was performed following the standard protocol during bolus administration of intravenous contrast.  Contrast: 80mL OMNIPAQUE IOHEXOL 300 MG/ML  SOLN  Comparison: Chest CT 01/30/2012.  Findings:  Mediastinum: Heart size is normal. There is no significant pericardial fluid, thickening or pericardial calcification. There is atherosclerosis of the thoracic aorta, the great vessels of the mediastinum and the coronary arteries, including calcified atherosclerotic plaque in the left anterior descending coronary arteries. Esophagus is unremarkable in appearance.  Prominent but nonenlarged subcarinal lymph nodes measuring 9 mm are similar to the prior examination.  Other prominent but nonenlarged middle mediastinal lymph nodes are noted, including 8 mm short axis lymph node adjacent to the distal thoracic esophagus. Right perihilar soft tissue thickening extending around the bronchi in the right lower lobe, similar to prior examinations.  Lungs/Pleura: New 1 cm nodule in the inferior aspect of the medial basal segment of the right lower lobe (image 51 of series 5). Again noted is extensive architectural distortion throughout the right lung, most pronounced in the perihilar region, compatible with evolving postradiation changes.  Compared to the prior examination from 01/30/2012, the extent of the associated ground- glass attenuation has significantly decreased, now with more mass- like opacities and interstitial prominence, compatible with developing masslike fibrosis (expected post procedural related changes).  Previously  noted cavitary area in the perihilar left lower lobe has resolved. No pleural effusions.  Upper Abdomen: Percutaneous gastrostomy tube.  Mild multifocal parenchymal thinning in the visualized kidneys bilaterally.  No atherosclerosis.  Musculoskeletal: There are no aggressive appearing lytic or blastic lesions noted in the visualized portions of the skeleton.  IMPRESSION: 1.  Evolving postradiation changes in the right lung, as above, without definitive evidence to suggest local recurrence of disease. Continued attention on future follow up studies is recommended. 2.  New 1 cm nodular density in the inferior aspect of the medial basal segment of the right lower lobe.  This is nonspecific, and could be infectious or inflammatory etiology, however, a metastatic lesion is not excluded.  Attention on follow-up studies is recommended. 3.  Borderline enlarged mediastinal lymph nodes redemonstrated; nonspecific. 4.  Additional incidental findings, similar to prior studies, as above.   Original Report Authenticated By: Trudie Reed, M.D.     ASSESSMENT: This is a very pleasant 79 years old white male with history of stage IIIB non-small cell lung cancer status post concurrent chemoradiation and has been observation since October of  2013 was no evidence for disease progression.   PLAN: I discussed the scan results with the patient and his family. I recommended for him to continue on observation for now with repeat CT scan of the chest in 3 months. I will refer the patient back to interventional radiology for removal of the PEG tube. He was advised to call immediately if he has any concerning symptoms in the interval.  All questions were answered. The patient knows to call the clinic with any problems, questions or concerns. We can certainly see the patient much sooner if necessary.

## 2012-05-02 NOTE — Telephone Encounter (Signed)
gv pt appt schedule for June...s/w Tina in IR and she will call pt for port removal....pt aware cs will call with d/t of ct

## 2012-05-04 NOTE — Patient Instructions (Signed)
The scan showed no evidence for disease progression. Followup in 3 months with repeat CT scan of the chest

## 2012-05-06 ENCOUNTER — Ambulatory Visit (HOSPITAL_COMMUNITY)
Admission: RE | Admit: 2012-05-06 | Discharge: 2012-05-06 | Disposition: A | Discharge status: Home / Self Care | Payer: Medicare Other | Source: Ambulatory Visit | Attending: Internal Medicine | Admitting: Internal Medicine

## 2012-05-06 DIAGNOSIS — C3491 Malignant neoplasm of unspecified part of right bronchus or lung: Secondary | ICD-10-CM

## 2012-05-06 DIAGNOSIS — Z431 Encounter for attention to gastrostomy: Secondary | ICD-10-CM | POA: Insufficient documentation

## 2012-05-06 DIAGNOSIS — C3490 Malignant neoplasm of unspecified part of unspecified bronchus or lung: Secondary | ICD-10-CM | POA: Insufficient documentation

## 2012-05-06 MED ORDER — LIDOCAINE VISCOUS 2 % MT SOLN
5.0000 mL | Freq: Once | OROMUCOSAL | Status: AC
  Administered 2012-05-06: 5 mL via OROMUCOSAL
  Filled 2012-05-06: qty 5

## 2012-05-06 NOTE — Procedures (Signed)
Successful bedside removal of gastrostomy without immediate complication.

## 2012-05-20 ENCOUNTER — Telehealth: Payer: Self-pay | Admitting: Medical Oncology

## 2012-05-20 NOTE — Telephone Encounter (Signed)
Wife called and left message  to report that Justin Pittman is "not doing well ever since feeding tube was removed removed 05/06/12" .   I called back and spoke to pt who said  his stomach hurt when his tube was removed. Pt stated the exit site stopped draining several days ago. He doesn't need to use guaze anymore over the exit site.  He also reported that today his BP "is low and pulse is high"  . I asked  him what the VS numbers were and he said  his pulse is 91  and his BP is 110/67.  . I told him his VSS . I also instructed him to  use warm compresses over the exit site and use some polysporin over area.   He said he is drinking and eating fairly well. His BM are not regular -he has BM about every couple days . I instructed pt to use Miralax and told  him to call his PCP if he continues to have stomach  pain .

## 2012-05-24 ENCOUNTER — Telehealth: Payer: Self-pay | Admitting: *Deleted

## 2012-05-24 NOTE — Telephone Encounter (Signed)
Patient wife called and stated that patient looked like he was going to pass out and could not walk very far. Stated this was not like him. Was going to make an appointment for Monday to come in to see a provider, but was hesitate to wait. Told her that she shouldn't wait to go ahead and take him to Urgent Care. She Agreed and will call Monday morning with update.

## 2012-05-27 ENCOUNTER — Telehealth: Payer: Self-pay | Admitting: Medical Oncology

## 2012-05-27 NOTE — Telephone Encounter (Signed)
Wife still concerned with pt because he is weak and not eating well. He has a low grade temp 99-100 f., back pain and she feels like he is going downhill ever since that feeding tube was removed. Pt is scheduled to see Dr Amanda Cockayne PA tomorrow and I told wife to review his symptoms with her and if his temp goes up to 100.8 or higher to go to ED. She voices understanding.

## 2012-05-28 ENCOUNTER — Ambulatory Visit (INDEPENDENT_AMBULATORY_CARE_PROVIDER_SITE_OTHER): Payer: Medicare Other | Admitting: Nurse Practitioner

## 2012-05-28 ENCOUNTER — Ambulatory Visit
Admission: RE | Admit: 2012-05-28 | Discharge: 2012-05-28 | Disposition: A | Discharge status: Home / Self Care | Payer: Medicare Other | Source: Ambulatory Visit | Attending: Nurse Practitioner | Admitting: Nurse Practitioner

## 2012-05-28 ENCOUNTER — Encounter: Payer: Self-pay | Admitting: Nurse Practitioner

## 2012-05-28 VITALS — BP 110/72 | HR 94 | Temp 98.1°F | Resp 24 | Wt 121.0 lb

## 2012-05-28 DIAGNOSIS — R059 Cough, unspecified: Secondary | ICD-10-CM

## 2012-05-28 DIAGNOSIS — R634 Abnormal weight loss: Secondary | ICD-10-CM

## 2012-05-28 DIAGNOSIS — R05 Cough: Secondary | ICD-10-CM

## 2012-05-28 MED ORDER — MIRTAZAPINE 7.5 MG PO TABS: 15.0000 mg | ORAL_TABLET | Freq: Every day | ORAL | Status: DC

## 2012-05-28 NOTE — Progress Notes (Signed)
Patient ID: Justin Pittman, male   DOB: 10-25-1932, 79 y.o.   MRN: [id number]  No Known Allergies  Chief Complaint  Patient presents with  . Dizziness  . Weight Loss    not hungry  . Fever  . Abdominal Pain    had feeding tube removed 3 weeks ago    HPI: Patient is a 79 y.o. male seen in the office today for  6 weeks of chemo and radiation for lung cancer; went to the hospital due to esophagitis and thrush with FTT went to hospital weighing 123 lbs; PEG tube was inserted during hospitalization and was doing this at home. On March 10th this was taken out- because he was eating well and he wanted it out. Since then he has lost 18 lbs. Not eating. Reports no appetite . Reports his back hurts dull ache to lower back. Wife states he has had a temperature 99.7 4 days ago- then was back down to 98.0--- also with one time complaint of dizziness. Denies pain with swallowing, pain in mouth or throat, coughing with swallowing or feeling choked. Denies abdominal pain, N/V/D or constipation.   Review of Systems:  Review of Systems  Constitutional: Positive for weight loss and malaise/fatigue. Negative for diaphoresis. Fever: wife reports one time noted fever.  HENT: Negative.   Eyes: Negative.   Respiratory: Positive for cough. Shortness of breath: no worsening in shortness of breath. Wheezing: at times has wheezing.   Cardiovascular: Negative.   Gastrointestinal: Negative.   Genitourinary: Negative.  Flank pain: reports low back dullness.  Skin: Negative for itching and rash.  Neurological: Positive for weakness. Negative for tremors and loss of consciousness. Dizziness: one episode.  Psychiatric/Behavioral: Positive for depression. Negative for suicidal ideas.    Past Medical History  Diagnosis Date  . HTN (hypertension)   . Lung cancer     Squamous Cell  . Vertigo 2010    Hospitalized a [facility]  . Hyperlipidemia   . Lung cancer    Past Surgical History  Procedure Laterality Date  .  Tonsillectomy    . Video bronchoscopy  09/20/2011    Procedure: VIDEO BRONCHOSCOPY WITHOUT FLUORO;  Surgeon: Nyoka Cowden, MD;  Location: [facility];  Service: Cardiopulmonary;  Laterality: Bilateral;  . Tonsillectomy     Social History:   reports that he quit smoking about 19 years ago. His smoking use included Cigarettes. He has a 40 pack-year smoking history. He has never used smokeless tobacco. He reports that he does not drink alcohol or use illicit drugs.  Family History  Problem Relation Age of Onset  . Cancer Brother     Bone    Medications: Patient's Medications  New Prescriptions   MIRTAZAPINE (REMERON) 7.5 MG TABLET    Take 2 tablets (15 mg total) by mouth at bedtime.  Previous Medications   PANTOPRAZOLE (PROTONIX) 40 MG TABLET    Take 40 mg by mouth daily.   POLYETHYLENE GLYCOL (MIRALAX / GLYCOLAX) PACKET    Take 17 g by mouth daily.   TAMSULOSIN (FLOMAX) 0.4 MG CAPS    Take by mouth.  Modified Medications   No medications on file  Discontinued Medications   NUTRITIONAL SUPPLEMENTS (FEEDING SUPPLEMENT, OSMOLITE 1.5 CAL,) LIQD    Place 237 mLs into feeding tube 2 (two) times daily.   PANTOPRAZOLE (PROTONIX) 40 MG TABLET    Take 1 tablet (40 mg total) by mouth 2 (two) times daily.   TAMSULOSIN HCL (FLOMAX) 0.4 MG CAPS  Take 1 capsule (0.4 mg total) by mouth daily.   WATER FOR IRRIGATION, STERILE (FREE WATER) SOLN    Place 60 mLs into feeding tube daily.     Physical Exam:  Filed Vitals:   05/28/12 1420  BP: 110/72  Pulse: 94  Temp: 98.1 F (36.7 C)  TempSrc: Oral  Resp: 24  Weight: 121 lb (54.885 kg)  SpO2: 95%   Physical Exam  Constitutional: He is oriented to person, place, and time. No distress.  HENT:  Head: Normocephalic and atraumatic.  Eyes: Conjunctivae and EOM are normal. Pupils are equal, round, and reactive to light.  Neck: Normal range of motion. Neck supple. No JVD present. No tracheal deviation present. No thyromegaly present.   Pulmonary/Chest: Effort normal. No stridor. No respiratory distress. He has wheezes (throughout lungs ).  Abdominal: Soft. Bowel sounds are normal. He exhibits no distension. There is no tenderness. There is no guarding.  Lymphadenopathy:    He has no cervical adenopathy.  Neurological: He is alert and oriented to person, place, and time.  Skin: Skin is warm and dry. He is not diaphoretic.  PEG site heeling on RUQ- nontender and no signs of infection  Psychiatric: He exhibits a depressed mood. He has a flat affect.     Labs reviewed: Basic Metabolic Panel:  Recent Labs  16/10/96 0610 12/23/11 0624 12/25/11 0600  12/28/11 0424 01/30/12 1132 04/30/12 1438  NA 131* 132* 132*  < > 133* 140 141  K 4.1 4.1 4.3  < > 3.5 4.3 4.1  CL 97 96 95*  < > 96 102 103  CO2 27 29 27   < > 28 27 29   GLUCOSE 128* 129* 95  < > 116* 87 91  BUN 20 24* 27*  < > 22 23.0 26.3*  CREATININE 0.88 0.92 0.92  < > 0.88 1.0 1.2  CALCIUM 8.5 8.8 9.1  < > 8.7 9.5 9.3  MG 1.4* 1.7 1.6  --  1.8  --   --   PHOS 3.6  --  4.2  --  3.5  --   --   < > = values in this interval not displayed. Liver Function Tests:  Recent Labs  12/28/11 0424 01/30/12 1132 04/30/12 1438  AST 20 19 20   ALT 30 14 15   ALKPHOS 116 149 179*  BILITOT 0.2* 0.35 0.34  PROT 5.9* 6.9 6.9  ALBUMIN 2.5* 3.2* 3.6   CBC:  Recent Labs  12/25/11 0600 12/26/11 0615 01/30/12 1132 04/30/12 1438  WBC 5.0 3.9* 5.8 5.1  NEUTROABS 3.7  --  3.8 3.5  HGB 9.0* 8.1* 10.3* 11.7*  HCT 26.2* 23.6* 30.0* 34.9*  MCV 80.4 80.8 86.2 85.1  PLT 188 157 290 198   Lipid Panel:  Recent Labs  12/14/11 0640 12/18/11 0455 12/25/11 0600  CHOL 129 79 96  TRIG 87 32 112    Past Procedures: Past Surgical History  Procedure Laterality Date  . Tonsillectomy    . Video bronchoscopy  09/20/2011    Procedure: VIDEO BRONCHOSCOPY WITHOUT FLUORO;  Surgeon: Nyoka Cowden, MD;  Location: [facility];  Service: Cardiopulmonary;  Laterality: Bilateral;   . Tonsillectomy       Assessment/Plan Cough With history of non-small cell lung ca, asp pneumonia and new onset fever and decreased appetite will get chest xray to rule out worsening lung disease or pneumonia- will also get CBC with diff   Loss of weight Pt reports mainly a loss of appetite which is why  he is not eating. At this time will get labs but will start Remeron 15mg  qhs to help with mood and appetite.      Labs/tests ordered Amylase, lipase, CMp, TSH, CBC with diff, UA c&s, Chest xray

## 2012-05-28 NOTE — Assessment & Plan Note (Signed)
Pt reports mainly a loss of appetite which is why he is not eating. At this time will get labs but will start Remeron 15mg  qhs to help with mood and appetite.

## 2012-05-28 NOTE — Assessment & Plan Note (Signed)
With history of non-small cell lung ca, asp pneumonia and new onset fever and decreased appetite will get chest xray to rule out worsening lung disease or pneumonia- will also get CBC with diff

## 2012-05-28 NOTE — Patient Instructions (Addendum)
Cont Ensure between meals  Keep follow up appt with Dr Chilton Si

## 2012-05-29 ENCOUNTER — Other Ambulatory Visit: Payer: Self-pay | Admitting: *Deleted

## 2012-05-29 ENCOUNTER — Telehealth: Payer: Self-pay | Admitting: *Deleted

## 2012-05-29 DIAGNOSIS — R634 Abnormal weight loss: Secondary | ICD-10-CM

## 2012-05-29 LAB — URINALYSIS
Bilirubin, UA: NEGATIVE
Ketones, UA: NEGATIVE
RBC, UA: NEGATIVE
Specific Gravity, UA: 1.03 (ref 1.005–1.030)
pH, UA: 6 (ref 5.0–7.5)

## 2012-05-29 LAB — COMPREHENSIVE METABOLIC PANEL
ALT: 7 IU/L (ref 0–44)
Calcium: 9.1 mg/dL (ref 8.6–10.2)
Chloride: 97 mmol/L (ref 97–108)
GFR calc non Af Amer: 50 mL/min/{1.73_m2} — ABNORMAL LOW (ref >59)
Glucose: 95 mg/dL (ref 65–99)
Potassium: 4.3 mmol/L (ref 3.5–5.2)
Total Protein: 6.1 g/dL (ref 6.0–8.5)

## 2012-05-29 LAB — TSH: TSH: 1.26 u[IU]/mL (ref 0.450–4.500)

## 2012-05-29 LAB — CBC WITH DIFFERENTIAL/PLATELET
Eos: 6 % — ABNORMAL HIGH (ref 0–5)
HCT: 30.8 % — ABNORMAL LOW (ref 37.5–51.0)
Hemoglobin: 10.7 g/dL — ABNORMAL LOW (ref 12.6–17.7)
Lymphocytes Absolute: 0.4 10*3/uL — ABNORMAL LOW (ref 0.7–3.1)
Monocytes: 15 % — ABNORMAL HIGH (ref 4–12)
Neutrophils Absolute: 4.5 10*3/uL (ref 1.4–7.0)
RBC: 3.8 x10E6/uL — ABNORMAL LOW (ref 4.14–5.80)
WBC: 6.2 10*3/uL (ref 3.4–10.8)

## 2012-05-29 LAB — AMYLASE: Amylase: 36 U/L (ref 31–124)

## 2012-05-29 MED ORDER — LEVOFLOXACIN 500 MG PO TABS: ORAL_TABLET | ORAL | Status: DC

## 2012-05-29 MED ORDER — MIRTAZAPINE 7.5 MG PO TABS: 15.0000 mg | ORAL_TABLET | Freq: Every day | ORAL | Status: DC

## 2012-05-29 NOTE — Telephone Encounter (Signed)
Patient wife has been notified. And Shanda Bumps answered some more questions that Mrs. Querry had

## 2012-05-29 NOTE — Telephone Encounter (Signed)
pts wife called stating that pt has not done well since he got his PEG tube removed.  He has lost 18 Ibs and is not eating.  He states "I'm full".  His quality of life is poor.  He was put on remeron and levaquin for questionable PNA per his PCP.  She is calling wanting to know what we advised.  Informed her that pt is on observation for his lung cancer and if he does continue to have issues they would need to f/u with PCP.  She verbalized understanding.  SLJ

## 2012-05-30 LAB — URINE CULTURE

## 2012-06-12 ENCOUNTER — Ambulatory Visit (INDEPENDENT_AMBULATORY_CARE_PROVIDER_SITE_OTHER): Payer: Medicare Other | Admitting: Internal Medicine

## 2012-06-12 VITALS — BP 118/72 | HR 90 | Temp 97.8°F | Resp 18 | Ht 70.0 in | Wt 132.0 lb

## 2012-06-12 DIAGNOSIS — R5381 Other malaise: Secondary | ICD-10-CM | POA: Insufficient documentation

## 2012-06-12 DIAGNOSIS — E46 Unspecified protein-calorie malnutrition: Secondary | ICD-10-CM

## 2012-06-12 DIAGNOSIS — D61818 Other pancytopenia: Secondary | ICD-10-CM

## 2012-06-12 DIAGNOSIS — C3490 Malignant neoplasm of unspecified part of unspecified bronchus or lung: Secondary | ICD-10-CM

## 2012-06-12 DIAGNOSIS — R634 Abnormal weight loss: Secondary | ICD-10-CM

## 2012-06-12 DIAGNOSIS — I10 Essential (primary) hypertension: Secondary | ICD-10-CM

## 2012-06-12 DIAGNOSIS — J690 Pneumonitis due to inhalation of food and vomit: Secondary | ICD-10-CM

## 2012-06-12 NOTE — Patient Instructions (Signed)
Continue current medications. 

## 2012-06-12 NOTE — Progress Notes (Signed)
Subjective:    Patient ID: Justin Pittman, male    DOB: 1932-12-17, 79 y.o.   MRN: [id number]  HPI  Recently treated for possible pneumonia. Has residual dry, hacking cough. No fever. Much stronger since tx with levaquin. Not rattling in the chest. Able to swallow better.  His wife is worried that he may need a feeding tube again.   Continues to see Dr. Shirline Frees. Had CT chest.  Review of Systems  Constitutional: Positive for fatigue. Negative for fever, chills, diaphoresis, activity change and appetite change.  HENT: Negative.   Eyes: Negative.   Respiratory: Positive for shortness of breath. Negative for cough and wheezing.        Hx lung cancer.  Cardiovascular: Negative.   Gastrointestinal: Negative.   Endocrine: Negative.   Genitourinary: Negative.   Musculoskeletal: Negative.   Skin: Negative.        Prior area of proud flesh at site of PEG tube insertion has healed.  Allergic/Immunologic: Negative.   Neurological: Negative.   Hematological: Negative.   Psychiatric/Behavioral: Negative.        Objective:   Physical Exam  Constitutional: He is oriented to person, place, and time. No distress.  Thin.  HENT:  Head: Normocephalic and atraumatic.  Right Ear: External ear normal.  Left Ear: External ear normal.  Nose: Nose normal.  Mouth/Throat: Oropharynx is clear and moist.  Eyes: Conjunctivae and EOM are normal. Pupils are equal, round, and reactive to light. Right eye exhibits no discharge. Left eye exhibits no discharge.  Neck: Normal range of motion. Neck supple. No JVD present. No tracheal deviation present. No thyromegaly present.  Cardiovascular: Normal rate, regular rhythm and intact distal pulses.  Exam reveals gallop. Exam reveals no friction rub.   Pulmonary/Chest: Effort normal and breath sounds normal. No respiratory distress. He has no wheezes. He has no rales. He exhibits no tenderness.  Abdominal: Soft. Bowel sounds are normal. He exhibits no distension  and no mass. There is no tenderness.  Musculoskeletal: Normal range of motion. He exhibits no edema and no tenderness.  Lymphadenopathy:    He has no cervical adenopathy.  Neurological: He is alert and oriented to person, place, and time. He has normal reflexes. No cranial nerve deficit. Coordination normal.  Skin: Skin is warm and dry. No rash noted. He is not diaphoretic. No erythema. No pallor.  Psychiatric: He has a normal mood and affect. His behavior is normal. Judgment and thought content normal.          Assessment & Plan:   Problem List Items Addressed This Visit     ICD-9-CM   Pancytopenia    Stable   Protein calorie malnutrition    Wife  Is quite concerned about his weight loss and is thinking he may need to get feeding tube put back. He has had a set back due to recent pneumonia. He says his appetite is getting better. Denies coughing with meals.   Non-small cell lung cancer    Continues to see oncologist.    New 1 cm nodular density in the inferior aspect of the medial basal segment of the right lower lobe.  This is nonspecific, and could be infectious or inflammatory etiology, however, a metastatic lesion is not excluded.  Attention on follow-up studies is recommended.    Hypertension    Controlled on current meds   Aspiration pneumonia    Improved. Treated with Levequin   Loss of weight    Multifactorial--recent pneumonia, lung cancer  Other malaise and fatigue - Primary    Sleeps a lot. Unable to complete tasks that his wife believes he should be able to do.    I think his loss of energy is related to recent pneumonia and the lung cancer treatments. Will continue to follow lab.   Relevant Orders      CBC with Differential      CMP with eGFR      TSH

## 2012-06-17 ENCOUNTER — Encounter: Payer: Self-pay | Admitting: Internal Medicine

## 2012-07-15 ENCOUNTER — Other Ambulatory Visit: Payer: Self-pay | Admitting: Internal Medicine

## 2012-07-18 ENCOUNTER — Telehealth: Payer: Self-pay | Admitting: Internal Medicine

## 2012-07-18 NOTE — Telephone Encounter (Signed)
Talked to pt gave him new appt time on 08/05/12 2pm

## 2012-08-01 ENCOUNTER — Ambulatory Visit (HOSPITAL_COMMUNITY)
Admission: RE | Admit: 2012-08-01 | Discharge: 2012-08-01 | Disposition: A | Discharge status: Home / Self Care | Payer: Medicare Other | Source: Ambulatory Visit | Attending: Internal Medicine | Admitting: Internal Medicine

## 2012-08-01 ENCOUNTER — Other Ambulatory Visit (HOSPITAL_BASED_OUTPATIENT_CLINIC_OR_DEPARTMENT_OTHER): Payer: Medicare Other | Admitting: Lab

## 2012-08-01 DIAGNOSIS — J90 Pleural effusion, not elsewhere classified: Secondary | ICD-10-CM | POA: Insufficient documentation

## 2012-08-01 DIAGNOSIS — C3490 Malignant neoplasm of unspecified part of unspecified bronchus or lung: Secondary | ICD-10-CM

## 2012-08-01 DIAGNOSIS — Y842 Radiological procedure and radiotherapy as the cause of abnormal reaction of the patient, or of later complication, without mention of misadventure at the time of the procedure: Secondary | ICD-10-CM | POA: Insufficient documentation

## 2012-08-01 DIAGNOSIS — C3491 Malignant neoplasm of unspecified part of right bronchus or lung: Secondary | ICD-10-CM

## 2012-08-01 DIAGNOSIS — J701 Chronic and other pulmonary manifestations due to radiation: Secondary | ICD-10-CM | POA: Insufficient documentation

## 2012-08-01 DIAGNOSIS — C3430 Malignant neoplasm of lower lobe, unspecified bronchus or lung: Secondary | ICD-10-CM

## 2012-08-01 DIAGNOSIS — I700 Atherosclerosis of aorta: Secondary | ICD-10-CM | POA: Insufficient documentation

## 2012-08-01 LAB — CBC WITH DIFFERENTIAL/PLATELET
Basophils Absolute: 0 10*3/uL (ref 0.0–0.1)
Eosinophils Absolute: 0.3 10*3/uL (ref 0.0–0.5)
HCT: 35 % — ABNORMAL LOW (ref 38.4–49.9)
HGB: 11.4 g/dL — ABNORMAL LOW (ref 13.0–17.1)
LYMPH%: 18.2 % (ref 14.0–49.0)
MCV: 85.4 fL (ref 79.3–98.0)
MONO#: 0.7 10*3/uL (ref 0.1–0.9)
MONO%: 12.9 % (ref 0.0–14.0)
NEUT#: 3.6 10*3/uL (ref 1.5–6.5)
NEUT%: 64 % (ref 39.0–75.0)
Platelets: 167 10*3/uL (ref 140–400)
RBC: 4.1 10*6/uL — ABNORMAL LOW (ref 4.20–5.82)

## 2012-08-01 LAB — COMPREHENSIVE METABOLIC PANEL (CC13)
Alkaline Phosphatase: 160 U/L — ABNORMAL HIGH (ref 40–150)
Creatinine: 1.3 mg/dL (ref 0.7–1.3)
Glucose: 91 mg/dl (ref 70–99)
Sodium: 142 mEq/L (ref 136–145)
Total Bilirubin: 0.48 mg/dL (ref 0.20–1.20)
Total Protein: 6.6 g/dL (ref 6.4–8.3)

## 2012-08-01 MED ORDER — IOHEXOL 300 MG/ML  SOLN
80.0000 mL | Freq: Once | INTRAMUSCULAR | Status: AC | PRN
  Administered 2012-08-01: 80 mL via INTRAVENOUS

## 2012-08-05 ENCOUNTER — Ambulatory Visit (HOSPITAL_BASED_OUTPATIENT_CLINIC_OR_DEPARTMENT_OTHER): Payer: Medicare Other | Admitting: Internal Medicine

## 2012-08-05 ENCOUNTER — Telehealth: Payer: Self-pay | Admitting: Internal Medicine

## 2012-08-05 ENCOUNTER — Encounter: Payer: Self-pay | Admitting: Internal Medicine

## 2012-08-05 VITALS — BP 129/76 | HR 80 | Temp 96.9°F | Resp 17 | Ht 70.0 in | Wt 143.1 lb

## 2012-08-05 DIAGNOSIS — C3491 Malignant neoplasm of unspecified part of right bronchus or lung: Secondary | ICD-10-CM

## 2012-08-05 DIAGNOSIS — C3490 Malignant neoplasm of unspecified part of unspecified bronchus or lung: Secondary | ICD-10-CM

## 2012-08-05 NOTE — Patient Instructions (Signed)
Your scan showed evidence for slight disease progression. Followup visit in 3 months with repeat CT scan of the chest

## 2012-08-05 NOTE — Progress Notes (Signed)
[facility] [phone]   [phone]  OFFICE PROGRESS NOTE  GREEN, [name], MD [address] [place] [address]  DIAGNOSIS: Stage IIIB non-small cell lung cancer, squamous cell carcinoma with right lower lobe lung mass as well as left hilar lymphadenopathy diagnosed in August of 2013.   PRIOR THERAPY: Concurrent chemoradiation with weekly carboplatin for AUC of 2 and paclitaxel 45 mg/M2, last dose of chemotherapy was given 12/05/2011 with partial response.   CURRENT THERAPY: None.  INTERVAL HISTORY: Justin Pittman 79 y.o. male returns to the clinic today for three-month followup visit accompanied by his wife and son. The patient is feeling fine today with no specific complaints except for shortness breath with exertion. He gained 4 pounds since his last visit. The patient denied having any significant fatigue or weakness. He denied having any chest pain, cough or hemoptysis. He had repeat CT scan of the chest performed recently and he is here for evaluation and discussion of his scan results.  MEDICAL HISTORY: Past Medical History  Diagnosis Date  . HTN (hypertension)   . Lung cancer     Squamous Cell  . Vertigo 2010    Hospitalized a [facility]  . Hyperlipidemia   . Lung cancer     ALLERGIES:  has No Known Allergies.  MEDICATIONS:  Current Outpatient Prescriptions  Medication Sig Dispense Refill  . mirtazapine (REMERON) 7.5 MG tablet Take 2 tablets (15 mg total) by mouth at bedtime.  60 tablet  3  . pantoprazole (PROTONIX) 40 MG tablet TAKE 1 TABLET BY MOUTH TWICE DAILY  60 tablet  0  . tamsulosin (FLOMAX) 0.4 MG CAPS Take by mouth.      . polyethylene glycol (MIRALAX / GLYCOLAX) packet Take 17 g by mouth daily.  14 each  0   No current facility-administered medications for this visit.    SURGICAL HISTORY:  Past Surgical History  Procedure Laterality Date  . Tonsillectomy    . Video bronchoscopy  09/20/2011    Procedure: VIDEO  BRONCHOSCOPY WITHOUT FLUORO;  Surgeon: Nyoka Cowden, MD;  Location: [facility];  Service: Cardiopulmonary;  Laterality: Bilateral;  . Tonsillectomy      REVIEW OF SYSTEMS:  A comprehensive review of systems was negative except for: Respiratory: positive for dyspnea on exertion   PHYSICAL EXAMINATION: General appearance: alert, cooperative and no distress Head: Normocephalic, without obvious abnormality, atraumatic Neck: no adenopathy Lymph nodes: Cervical, supraclavicular, and axillary nodes normal. Resp: clear to auscultation bilaterally Cardio: regular rate and rhythm, S1, S2 normal, no murmur, click, rub or gallop GI: soft, non-tender; bowel sounds normal; no masses,  no organomegaly Extremities: extremities normal, atraumatic, no cyanosis or edema  ECOG PERFORMANCE STATUS: 1 - Symptomatic but completely ambulatory  Blood pressure 129/76, pulse 80, temperature 96.9 F (36.1 C), temperature source Oral, resp. rate 17, height 5\' 10"  (1.778 m), weight 143 lb 1.6 oz (64.91 kg).  LABORATORY DATA: Lab Results  Component Value Date   WBC 5.6 08/01/2012   HGB 11.4* 08/01/2012   HCT 35.0* 08/01/2012   MCV 85.4 08/01/2012   PLT 167 08/01/2012      Chemistry      Component Value Date/Time   NA 142 08/01/2012 0945   NA 138 05/28/2012 1621   NA 133* 12/28/2011 0424   K 4.2 08/01/2012 0945   K 4.3 05/28/2012 1621   CL 105 08/01/2012 0945   CL 97 05/28/2012 1621   CO2 30* 08/01/2012 0945  CO2 25 05/28/2012 1621   BUN 22.2 08/01/2012 0945   BUN 26 05/28/2012 1621   BUN 22 12/28/2011 0424   CREATININE 1.3 08/01/2012 0945   CREATININE 1.35* 05/28/2012 1621      Component Value Date/Time   CALCIUM 8.9 08/01/2012 0945   CALCIUM 9.1 05/28/2012 1621   ALKPHOS 160* 08/01/2012 0945   ALKPHOS 110 05/28/2012 1621   AST 15 08/01/2012 0945   AST 17 05/28/2012 1621   ALT 8 08/01/2012 0945   ALT 7 05/28/2012 1621   BILITOT 0.48 08/01/2012 0945   BILITOT 0.3 05/28/2012 1621       RADIOGRAPHIC STUDIES: Ct Chest W  Contrast  08/01/2012   *RADIOLOGY REPORT*  Clinical Data: Lung cancer restaging.  Chemotherapy completed in 2013.  Stage IIIB non-small cell.  Squamous cell carcinoma of right lower lobe with left hilar adenopathy.  CT CHEST WITH CONTRAST  Technique:  Multidetector CT imaging of the chest was performed following the standard protocol during bolus administration of intravenous contrast.  Contrast: 80mL OMNIPAQUE IOHEXOL 300 MG/ML  SOLN  Comparison: Plain film 05/28/2012.  Most recent CT 04/30/2012.  Findings: Lungs/pleura: Centrilobular emphysema.  A right basilar lung nodule  measures 1.1 x 1.0 cm on image 48/series 7.  1.0 x 0.9 cm on the prior exam.  1.2 cm cranial caudal on coronal image 38 versus 9 mm on coronal image 49 of the prior exam.  Clear left lung.  Slight increase in right paramediastinal radiation fibrosis.  No convincing evidence of locally recurrent disease. Apparent nodular component in the right lower lobe which measures 1.2 x 1.2 cm on image 29/series 7.  Small right pleural effusion which is new. Trace left-sided pleural fluid or thickening is also new.  More mild left paramediastinal radiation fibrosis is similar.  Heart/Mediastinum: No supraclavicular adenopathy.  Extensive aortic atherosclerosis, including mild ulcerative plaque in the descending segment.  Normal heart size with trace anterior pericardial fluid thickening.  No central pulmonary embolism, on this non-dedicated study.  An azygo-esophageal recess node is unchanged 8 mm on image 32/series 2.  Right infrahilar node measures 8 mm on image 34/series 2 and is unchanged.  Similar interstitial thickening along the right hilum, without well-defined adenopathy. 8 mm retrocrural node is similar.  Upper abdomen: Scarred kidneys, especially on the right.  Normal imaged portions of adrenal glands.  Bones/Musculoskeletal:  No acute osseous abnormality.  IMPRESSION:  1.  Suspect slight enlargement of a right medial basilar lung nodule.  Cannot  exclude metastatic disease. 2.  Evolving radiation changes, greater on the right than left. Apparent nodularity within.  This warrants followup attention.  3.  Development of small right greater than left pleural effusions. 4.  Similar small thoracic nodes, without adenopathy.   Original Report Authenticated By: Jeronimo Greaves, M.D.    ASSESSMENT: This is a very pleasant 79 years old white male with history of stage IIIB non-small cell lung cancer status post concurrent chemoradiation completed in October of 2013 and he has been observation since that time. The patient is doing fine today but there is some evidence for a sclerotic disease progression.   PLAN: I discussed the scan results with the patient and his family. I recommended for him to continue on observation for now with repeat CT scan of the chest in 3 months for reevaluation of his disease. If he continues to have evidence for disease progression I would consider the patient for treatment at that time. He was advised to call immediately  if he has any concerning symptoms in the interval especially cough, hemoptysis or significant shortness of breath.  All questions were answered. The patient knows to call the clinic with any problems, questions or concerns. We can certainly see the patient much sooner if necessary.

## 2012-08-05 NOTE — Telephone Encounter (Signed)
Gave pt appt for lab and Md on September 2014 °

## 2012-08-06 ENCOUNTER — Telehealth: Payer: Self-pay | Admitting: Medical Oncology

## 2012-08-06 ENCOUNTER — Encounter: Payer: Self-pay | Admitting: Nurse Practitioner

## 2012-08-06 NOTE — Telephone Encounter (Signed)
Justin Pittman is opposed to Justin Pittman taking iron and wants to feed him iron rich foods and asked for list of same foods. I mailed her a list.

## 2012-08-22 DIAGNOSIS — J90 Pleural effusion, not elsewhere classified: Secondary | ICD-10-CM | POA: Insufficient documentation

## 2012-08-24 ENCOUNTER — Other Ambulatory Visit: Payer: Self-pay | Admitting: Internal Medicine

## 2012-09-16 ENCOUNTER — Other Ambulatory Visit: Payer: Self-pay | Admitting: Internal Medicine

## 2012-09-18 ENCOUNTER — Encounter: Payer: Self-pay | Admitting: *Deleted

## 2012-09-18 ENCOUNTER — Encounter: Payer: Self-pay | Admitting: Internal Medicine

## 2012-09-18 ENCOUNTER — Ambulatory Visit (INDEPENDENT_AMBULATORY_CARE_PROVIDER_SITE_OTHER): Payer: Medicare Other | Admitting: Internal Medicine

## 2012-09-18 VITALS — BP 140/70 | HR 79 | Temp 97.5°F | Resp 16 | Ht 70.0 in | Wt 148.6 lb

## 2012-09-18 DIAGNOSIS — J690 Pneumonitis due to inhalation of food and vomit: Secondary | ICD-10-CM

## 2012-09-18 DIAGNOSIS — E46 Unspecified protein-calorie malnutrition: Secondary | ICD-10-CM

## 2012-09-18 DIAGNOSIS — C3491 Malignant neoplasm of unspecified part of right bronchus or lung: Secondary | ICD-10-CM

## 2012-09-18 DIAGNOSIS — R05 Cough: Secondary | ICD-10-CM

## 2012-09-18 DIAGNOSIS — R0989 Other specified symptoms and signs involving the circulatory and respiratory systems: Secondary | ICD-10-CM

## 2012-09-18 DIAGNOSIS — I10 Essential (primary) hypertension: Secondary | ICD-10-CM

## 2012-09-18 DIAGNOSIS — C3490 Malignant neoplasm of unspecified part of unspecified bronchus or lung: Secondary | ICD-10-CM

## 2012-09-18 DIAGNOSIS — E785 Hyperlipidemia, unspecified: Secondary | ICD-10-CM

## 2012-09-18 DIAGNOSIS — R634 Abnormal weight loss: Secondary | ICD-10-CM

## 2012-09-18 MED ORDER — PANTOPRAZOLE SODIUM 40 MG PO TBEC: DELAYED_RELEASE_TABLET | ORAL | Status: DC

## 2012-09-18 NOTE — Progress Notes (Signed)
Subjective:    Patient ID: Justin Pittman, male    DOB: 1932-09-29, 79 y.o.   MRN: [id number]  HPI  Continues with chest rattle. No cough.  Continues to see Dr. Shirline Frees. Last visit in June 2014. Last chest scan showed probable disease progression. He is  In a period of watchful waiting Scan to be repeated in Sept 2014 In general he is feeling much stronger. He has gained weight. His appetite is improved. There is no nausea. He denies chest pain or palpitations.  Current Outpatient Prescriptions on File Prior to Visit  Medication Sig Dispense Refill  . mirtazapine (REMERON) 7.5 MG tablet Take 2 tablets (15 mg total) by mouth at bedtime.  60 tablet  3  . polyethylene glycol (MIRALAX / GLYCOLAX) packet Take 17 g by mouth daily.  14 each  0  . tamsulosin (FLOMAX) 0.4 MG CAPS TAKE ONE CAPSULE BY MOUTH ONCE DAILY FOR PROSTATE  30 capsule  3   No current facility-administered medications on file prior to visit.     Review of Systems  Constitutional: Positive for fatigue. Negative for fever, chills, diaphoresis, activity change and appetite change.  HENT: Negative.   Eyes: Negative.   Respiratory: Positive for shortness of breath. Negative for cough and wheezing.        Hx lung cancer.  Cardiovascular: Negative.   Gastrointestinal: Negative.   Endocrine: Negative.   Genitourinary: Negative.   Musculoskeletal: Negative.   Skin: Negative.        Prior area of proud flesh at site of PEG tube insertion has healed.  Allergic/Immunologic: Negative.   Neurological: Negative.   Hematological: Negative.   Psychiatric/Behavioral: Negative.        Objective:BP 140/70  Pulse 79  Temp(Src) 97.5 F (36.4 C) (Oral)  Resp 16  Ht 5\' 10"  (1.778 m)  Wt 148 lb 9.6 oz (67.405 kg)  BMI 21.32 kg/m2  SpO2 97%    Physical Exam  Constitutional: He is oriented to person, place, and time. No distress.  Thin.  HENT:  Head: Normocephalic and atraumatic.  Right Ear: External ear normal.  Left Ear:  External ear normal.  Nose: Nose normal.  Mouth/Throat: Oropharynx is clear and moist.  Eyes: Conjunctivae and EOM are normal. Pupils are equal, round, and reactive to light. Right eye exhibits no discharge. Left eye exhibits no discharge.  Neck: Normal range of motion. Neck supple. No JVD present. No tracheal deviation present. No thyromegaly present.  Cardiovascular: Normal rate, regular rhythm and intact distal pulses.  Exam reveals gallop. Exam reveals no friction rub.   Pulmonary/Chest: Effort normal and breath sounds normal. No respiratory distress. He has no wheezes. He has no rales. He exhibits no tenderness.  Abdominal: Soft. Bowel sounds are normal. He exhibits no distension and no mass. There is no tenderness.  Musculoskeletal: Normal range of motion. He exhibits no edema and no tenderness.  Lymphadenopathy:    He has no cervical adenopathy.  Neurological: He is alert and oriented to person, place, and time. He has normal reflexes. No cranial nerve deficit. Coordination normal.  Skin: Skin is warm and dry. No rash noted. He is not diaphoretic. No erythema. No pallor.  Psychiatric: He has a normal mood and affect. His behavior is normal. Judgment and thought content normal.      Appointment on 08/01/2012  Component Date Value Range Status  . Sodium 08/01/2012 142  136 - 145 mEq/L Final  . Potassium 08/01/2012 4.2  3.5 - 5.1 mEq/L Final  .  Chloride 08/01/2012 105  98 - 107 mEq/L Final  . CO2 08/01/2012 30* 22 - 29 mEq/L Final  . Glucose 08/01/2012 91  70 - 99 mg/dl Final  . BUN 78/29/5621 22.2  7.0 - 26.0 mg/dL Final  . Creatinine 30/86/5784 1.3  0.7 - 1.3 mg/dL Final  . Total Bilirubin 08/01/2012 0.48  0.20 - 1.20 mg/dL Final  . Alkaline Phosphatase 08/01/2012 160* 40 - 150 U/L Final  . AST 08/01/2012 15  5 - 34 U/L Final  . ALT 08/01/2012 8  0 - 55 U/L Final  . Total Protein 08/01/2012 6.6  6.4 - 8.3 g/dL Final  . Albumin 69/62/9528 3.4* 3.5 - 5.0 g/dL Final  . Calcium  41/32/4401 8.9  8.4 - 10.4 mg/dL Final  . WBC 02/72/5366 5.6  4.0 - 10.3 10e3/uL Final  . NEUT# 08/01/2012 3.6  1.5 - 6.5 10e3/uL Final  . HGB 08/01/2012 11.4* 13.0 - 17.1 g/dL Final  . HCT 44/04/4740 35.0* 38.4 - 49.9 % Final  . Platelets 08/01/2012 167  140 - 400 10e3/uL Final  . MCV 08/01/2012 85.4  79.3 - 98.0 fL Final  . MCH 08/01/2012 27.8  27.2 - 33.4 pg Final  . MCHC 08/01/2012 32.6  32.0 - 36.0 g/dL Final  . RBC 59/56/3875 4.10* 4.20 - 5.82 10e6/uL Final  . RDW 08/01/2012 15.7* 11.0 - 14.6 % Final  . lymph# 08/01/2012 1.0  0.9 - 3.3 10e3/uL Final  . MONO# 08/01/2012 0.7  0.1 - 0.9 10e3/uL Final  . Eosinophils Absolute 08/01/2012 0.3  0.0 - 0.5 10e3/uL Final  . Basophils Absolute 08/01/2012 0.0  0.0 - 0.1 10e3/uL Final  . NEUT% 08/01/2012 64.0  39.0 - 75.0 % Final  . LYMPH% 08/01/2012 18.2  14.0 - 49.0 % Final  . MONO% 08/01/2012 12.9  0.0 - 14.0 % Final  . EOS% 08/01/2012 4.7  0.0 - 7.0 % Final  . BASO% 08/01/2012 0.2  0.0 - 2.0 % Final  . nRBC 08/01/2012 0  0 - 0 % Final      Assessment & Plan:   Problem List Items Addressed This Visit     ICD-9-CM   Pancytopenia    Stable   Protein calorie malnutrition    Wife  Is quite concerned about his weight loss and is thinking he may need to get feeding tube put back. He has had a set back due to recent pneumonia. He says his appetite is getting better. Denies coughing with meals.   Non-small cell lung cancer    Continues to see oncologist.    New 1 cm nodular density in the inferior aspect of the medial basal segment of the right lower lobe.  This is nonspecific, and could be infectious or inflammatory etiology, however, a metastatic lesion is not excluded.  Attention on follow-up studies is recommended.    Hypertension    Controlled on current meds   Aspiration pneumonia    Improved. Treated with Levequin   Loss of weight    Multifactorial--recent pneumonia, lung cancer   Other malaise and fatigue - Primary    Sleeps a  lot. Unable to complete tasks that his wife believes he should be able to do.    I think his loss of energy is related to recent pneumonia and the lung cancer treatments. Will continue to follow lab.      CBC with Differential      CMP with eGFR      TSH  Assessment & Plan:   Radiation esophagitis:: Improved. Patient would like to reduce medications. Denies any to his pantoprazole to 1 daily as compared to his current twice daily. He does well and has no relapse of symptoms, he can discontinue this drug even prior to his next visit.   - Plan: pantoprazole (PROTONIX) 40 MG tablet  Non-small cell lung cancer, right: Last chest scan suggested some progression of disease. Dr. Arbutus Ped did not want to commit this patient is a chemotherapy yet. He will have followup chest scan this fall 2014.  Protein calorie malnutrition: Much improved  Hypertension: Controlled  Aspiration pneumonia: Resolved  Cough: Slight dry cough  Loss of weight: Improved  Left carotid bruit: Followed by vascular surgeons. Previously on medications to lower cholesterol. There were stopped during the period of time that he was so debilitated following his chemotherapy. He asked if he should resume this. I recommended that he go ahead and resume atorvastatin. We will recheck his lipids prior to next visit.   - Plan: Lipid panel

## 2012-09-18 NOTE — Patient Instructions (Addendum)
Reduce Protonix to once daily. Resume atorvastatin 20 mg daily.

## 2012-09-19 ENCOUNTER — Other Ambulatory Visit: Payer: Self-pay | Admitting: Internal Medicine

## 2012-09-19 ENCOUNTER — Other Ambulatory Visit: Payer: Medicare Other

## 2012-09-22 MED ORDER — ATORVASTATIN CALCIUM 20 MG PO TABS: ORAL_TABLET | ORAL | Status: DC

## 2012-09-27 LAB — CBC WITH DIFFERENTIAL
Basophils Absolute: 0 10*3/uL (ref 0.0–0.2)
Basos: 0 % (ref 0–3)
Eosinophils Absolute: 0.3 10*3/uL (ref 0.0–0.4)
HCT: 38.8 % (ref 37.5–51.0)
Hemoglobin: 12.8 g/dL (ref 12.6–17.7)
Lymphocytes Absolute: 0.8 10*3/uL (ref 0.7–3.1)
MCH: 28.4 pg (ref 26.6–33.0)
MCHC: 33 g/dL (ref 31.5–35.7)
MCV: 86 fL (ref 79–97)
Neutrophils Absolute: 3.4 10*3/uL (ref 1.4–7.0)
RDW: 16 % — ABNORMAL HIGH (ref 12.3–15.4)

## 2012-09-27 LAB — COMPREHENSIVE METABOLIC PANEL
ALT: 9 IU/L (ref 0–44)
Albumin/Globulin Ratio: 1.6 (ref 1.1–2.5)
Albumin: 4.3 g/dL (ref 3.5–4.8)
Alkaline Phosphatase: 154 IU/L — ABNORMAL HIGH (ref 39–117)
BUN/Creatinine Ratio: 18 (ref 10–22)
Chloride: 103 mmol/L (ref 97–108)
GFR calc Af Amer: 57 mL/min/{1.73_m2} — ABNORMAL LOW (ref >59)
GFR calc non Af Amer: 49 mL/min/{1.73_m2} — ABNORMAL LOW (ref >59)
Glucose: 88 mg/dL (ref 65–99)
Potassium: 4.4 mmol/L (ref 3.5–5.2)
Total Bilirubin: 0.3 mg/dL (ref 0.0–1.2)
Total Protein: 7 g/dL (ref 6.0–8.5)

## 2012-09-27 LAB — LIPID PANEL WITH LDL/HDL RATIO
Cholesterol, Total: 210 mg/dL — ABNORMAL HIGH (ref 100–199)
LDL Calculated: 143 mg/dL — ABNORMAL HIGH (ref 0–99)
VLDL Cholesterol Cal: 29 mg/dL (ref 5–40)

## 2012-09-30 ENCOUNTER — Other Ambulatory Visit: Payer: Self-pay | Admitting: Geriatric Medicine

## 2012-09-30 DIAGNOSIS — R0989 Other specified symptoms and signs involving the circulatory and respiratory systems: Secondary | ICD-10-CM

## 2012-09-30 DIAGNOSIS — E785 Hyperlipidemia, unspecified: Secondary | ICD-10-CM

## 2012-09-30 MED ORDER — ATORVASTATIN CALCIUM 20 MG PO TABS: ORAL_TABLET | ORAL | Status: DC

## 2012-10-22 ENCOUNTER — Encounter: Payer: Self-pay | Admitting: Internal Medicine

## 2012-10-22 ENCOUNTER — Ambulatory Visit (INDEPENDENT_AMBULATORY_CARE_PROVIDER_SITE_OTHER): Payer: Medicare Other | Admitting: Internal Medicine

## 2012-10-22 VITALS — BP 120/80 | HR 90 | Temp 98.2°F | Resp 16 | Ht 70.0 in | Wt 147.0 lb

## 2012-10-22 DIAGNOSIS — I10 Essential (primary) hypertension: Secondary | ICD-10-CM

## 2012-10-22 DIAGNOSIS — C3490 Malignant neoplasm of unspecified part of unspecified bronchus or lung: Secondary | ICD-10-CM

## 2012-10-22 DIAGNOSIS — C3491 Malignant neoplasm of unspecified part of right bronchus or lung: Secondary | ICD-10-CM

## 2012-10-22 DIAGNOSIS — R1011 Right upper quadrant pain: Secondary | ICD-10-CM

## 2012-10-22 DIAGNOSIS — G8929 Other chronic pain: Secondary | ICD-10-CM

## 2012-10-22 DIAGNOSIS — J90 Pleural effusion, not elsewhere classified: Secondary | ICD-10-CM

## 2012-10-22 MED ORDER — HYDROCODONE-ACETAMINOPHEN 10-325 MG PO TABS: ORAL_TABLET | ORAL | Status: DC

## 2012-10-22 NOTE — Patient Instructions (Addendum)
Use pain medication as needed.

## 2012-10-22 NOTE — Progress Notes (Signed)
Subjective:    Patient ID: Justin Pittman, male    DOB: Mar 26, 1932, 79 y.o.   MRN: [id number]  HPI Pain in the right side the last 2 weeks. The disorder is ribs. There is mild increase in discomfort with deep breathing. He denies significant cough. No hemoptysis. Denies fever or chills. Patient has known history of cancer with possible metastases into the right ribs.  Current Outpatient Prescriptions on File Prior to Visit  Medication Sig Dispense Refill  . ENSURE (ENSURE) Take 237 mLs by mouth.      . mirtazapine (REMERON) 7.5 MG tablet Take 2 tablets (15 mg total) by mouth at bedtime.  60 tablet  3  . pantoprazole (PROTONIX) 40 MG tablet One daily to reduce stomach acid  30 tablet  3  . tamsulosin (FLOMAX) 0.4 MG CAPS TAKE ONE CAPSULE BY MOUTH ONCE DAILY FOR PROSTATE  30 capsule  3  . atorvastatin (LIPITOR) 20 MG tablet One daily to control cholesterol  90 tablet  3  . polyethylene glycol (MIRALAX / GLYCOLAX) packet Take 17 g by mouth daily.  14 each  0   No current facility-administered medications on file prior to visit.    Review of Systems  Constitutional: Positive for fatigue. Negative for fever, chills, diaphoresis, activity change and appetite change.  HENT: Negative.   Eyes: Negative.   Respiratory: Positive for shortness of breath. Negative for cough and wheezing.        Hx lung cancer. Pain in the right upper abdomen and lower chest.  Cardiovascular: Negative.   Gastrointestinal: Negative.   Endocrine: Negative.   Genitourinary: Negative.   Musculoskeletal: Negative.   Skin: Negative.        Prior area of proud flesh at site of PEG tube insertion has healed.  Allergic/Immunologic: Negative.   Neurological: Negative.   Hematological: Negative.   Psychiatric/Behavioral: Negative.        Objective:BP 120/80  Pulse 90  Temp(Src) 98.2 F (36.8 C) (Oral)  Resp 16  Ht 5\' 10"  (1.778 m)  Wt 147 lb (66.679 kg)  BMI 21.09 kg/m2  SpO2 96%    Physical Exam   Constitutional: He is oriented to person, place, and time. No distress.  Thin.  HENT:  Head: Normocephalic and atraumatic.  Right Ear: External ear normal.  Left Ear: External ear normal.  Nose: Nose normal.  Mouth/Throat: Oropharynx is clear and moist.  Eyes: Conjunctivae and EOM are normal. Pupils are equal, round, and reactive to light. Right eye exhibits no discharge. Left eye exhibits no discharge.  Neck: Normal range of motion. Neck supple. No JVD present. No tracheal deviation present. No thyromegaly present.  Cardiovascular: Normal rate, regular rhythm and intact distal pulses.  Exam reveals gallop. Exam reveals no friction rub.   Pulmonary/Chest: Effort normal and breath sounds normal. No respiratory distress. He has no wheezes. He has no rales. He exhibits tenderness.  Tender in the right lower rib cage close to the costochondral junction.  Abdominal: Soft. Bowel sounds are normal. He exhibits no distension and no mass. There is no tenderness.  Musculoskeletal: Normal range of motion. He exhibits no edema and no tenderness.  Lymphadenopathy:    He has no cervical adenopathy.  Neurological: He is alert and oriented to person, place, and time. He has normal reflexes. No cranial nerve deficit. Coordination normal.  Skin: Skin is warm and dry. No rash noted. He is not diaphoretic. No erythema. No pallor.  Psychiatric: He has a normal mood and affect. His  behavior is normal. Judgment and thought content normal.     Orders Only on 09/19/2012  Component Date Value Range Status  . WBC 09/19/2012 5.1  3.4 - 10.8 x10E3/uL Final  . RBC 09/19/2012 4.50  4.14 - 5.80 x10E6/uL Final  . Hemoglobin 09/19/2012 12.8  12.6 - 17.7 g/dL Final  . HCT 95/28/4132 38.8  37.5 - 51.0 % Final  . MCV 09/19/2012 86  79 - 97 fL Final  . MCH 09/19/2012 28.4  26.6 - 33.0 pg Final  . MCHC 09/19/2012 33.0  31.5 - 35.7 g/dL Final  . RDW 44/02/270 16.0* 12.3 - 15.4 % Final  . Platelets 09/19/2012 257  150 -  379 x10E3/uL Final  . Neutrophils Relative % 09/19/2012 67  40 - 74 % Final  . Lymphs 09/19/2012 16  14 - 46 % Final  . Monocytes 09/19/2012 11  4 - 12 % Final  . Eos 09/19/2012 6* 0 - 5 % Final  . Basos 09/19/2012 0  0 - 3 % Final  . Neutrophils Absolute 09/19/2012 3.4  1.4 - 7.0 x10E3/uL Final  . Lymphocytes Absolute 09/19/2012 0.8  0.7 - 3.1 x10E3/uL Final  . Monocytes Absolute 09/19/2012 0.5  0.1 - 0.9 x10E3/uL Final  . Eosinophils Absolute 09/19/2012 0.3  0.0 - 0.4 x10E3/uL Final  . Basophils Absolute 09/19/2012 0.0  0.0 - 0.2 x10E3/uL Final  . Immature Granulocytes 09/19/2012 0  0 - 2 % Final  . Immature Grans (Abs) 09/19/2012 0.0  0.0 - 0.1 x10E3/uL Final  . Glucose 09/19/2012 88  65 - 99 mg/dL Final  . BUN 53/66/4403 24  8 - 27 mg/dL Final  . Creatinine, Ser 09/19/2012 1.36* 0.76 - 1.27 mg/dL Final  . GFR calc non Af Amer 09/19/2012 49* >59 mL/min/1.73 Final  . GFR calc Af Amer 09/19/2012 57* >59 mL/min/1.73 Final  . BUN/Creatinine Ratio 09/19/2012 18  10 - 22 Final  . Sodium 09/19/2012 142  134 - 144 mmol/L Final  . Potassium 09/19/2012 4.4  3.5 - 5.2 mmol/L Final  . Chloride 09/19/2012 103  97 - 108 mmol/L Final  . CO2 09/19/2012 25  18 - 29 mmol/L Final  . Calcium 09/19/2012 9.8  8.6 - 10.2 mg/dL Final  . Total Protein 09/19/2012 7.0  6.0 - 8.5 g/dL Final  . Albumin 47/42/5956 4.3  3.5 - 4.8 g/dL Final  . Globulin, Total 09/19/2012 2.7  1.5 - 4.5 g/dL Final  . Albumin/Globulin Ratio 09/19/2012 1.6  1.1 - 2.5 Final  . Total Bilirubin 09/19/2012 0.3  0.0 - 1.2 mg/dL Final  . Alkaline Phosphatase 09/19/2012 154* 39 - 117 IU/L Final  . AST 09/19/2012 16  0 - 40 IU/L Final  . ALT 09/19/2012 9  0 - 44 IU/L Final  . Cholesterol, Total 09/19/2012 210* 100 - 199 mg/dL Final  . Triglycerides 09/19/2012 147  0 - 149 mg/dL Final  . HDL 38/75/6433 38* >39 mg/dL Final   Comment: According to ATP-III Guidelines, HDL-C >59 mg/dL is considered a                          negative risk  factor for CHD.  Marland Kitchen VLDL Cholesterol Cal 09/19/2012 29  5 - 40 mg/dL Final  . LDL Calculated 09/19/2012 295* 0 - 99 mg/dL Final  . LDl/HDL Ratio 09/19/2012 3.8* 0.0 - 3.6 ratio units Final        Assessment & Plan:  Abdominal pain, chronic, right  upper quadrant : New onset  - Plan: HYDROcodone-acetaminophen (NORCO) 10-325 MG per tablet  US Abdomen Limited RUQ  Hypertension: Controlled off medication : Non-small cell lung cancer, right: Likely cause of her pain.   - Plan: HYDROcodone-acetaminophen (NORCO) 10-325 MG per tablet  DG Chest 2 View  Pleural effusion: Possibly associated with dyspnea and right chest/right upper quadrant discomfort.

## 2012-10-25 ENCOUNTER — Ambulatory Visit
Admission: RE | Admit: 2012-10-25 | Discharge: 2012-10-25 | Disposition: A | Discharge status: Home / Self Care | Payer: Medicare Other | Source: Ambulatory Visit | Attending: Internal Medicine | Admitting: Internal Medicine

## 2012-10-25 DIAGNOSIS — G8929 Other chronic pain: Secondary | ICD-10-CM

## 2012-10-29 ENCOUNTER — Ambulatory Visit (INDEPENDENT_AMBULATORY_CARE_PROVIDER_SITE_OTHER): Payer: Medicare Other | Admitting: Cardiology

## 2012-10-29 ENCOUNTER — Encounter: Payer: Self-pay | Admitting: Cardiology

## 2012-10-29 VITALS — BP 128/70 | HR 75 | Ht 70.0 in | Wt 147.0 lb

## 2012-10-29 DIAGNOSIS — I10 Essential (primary) hypertension: Secondary | ICD-10-CM

## 2012-10-29 DIAGNOSIS — R0989 Other specified symptoms and signs involving the circulatory and respiratory systems: Secondary | ICD-10-CM

## 2012-10-29 DIAGNOSIS — I6529 Occlusion and stenosis of unspecified carotid artery: Secondary | ICD-10-CM | POA: Insufficient documentation

## 2012-10-29 NOTE — Progress Notes (Signed)
HPI The patient presents for follow up for his carotid artery stenosis.  He has no prior cardiac history and underwent a normal myoview in October 2012 during workup for an isolated syncopal episode.  He was found to have a 60% left carotid stenosis diagnosed at the same time.  He does not report prior cardiac catheterization. He does not report any ongoing symptoms such as chest pressure, neck or arm discomfort. He doesn't notice any palpitations, presyncope or syncope. He has no PND or orthopnea. He denies any significant lower extremity pain or cramping with activity or at night but has had a decreased activity tolerance since being diagnosed with locally advanced squamous cell lung cancer last year.  This limitation is due to generalized fatigue and no overt cardiac symptoms.  He has undergone radiation and chemotherapy, and has a repeat CT scan later this week.    No Known Allergies  Current Outpatient Prescriptions  Medication Sig Dispense Refill  . ENSURE (ENSURE) Take 237 mLs by mouth.      Marland Kitchen HYDROcodone-acetaminophen (NORCO) 10-325 MG per tablet One tablet every 6 hours if needed for pain  50 tablet  3  . mirtazapine (REMERON) 7.5 MG tablet Take 2 tablets (15 mg total) by mouth at bedtime.  60 tablet  3  . pantoprazole (PROTONIX) 40 MG tablet Take 40 mg by mouth 2 (two) times daily. To  reduce stomach acid      . polyethylene glycol (MIRALAX / GLYCOLAX) packet Take 17 g by mouth daily.  14 each  0  . tamsulosin (FLOMAX) 0.4 MG CAPS TAKE ONE CAPSULE BY MOUTH ONCE DAILY FOR PROSTATE  30 capsule  3  . atorvastatin (LIPITOR) 20 MG tablet One daily to control cholesterol  90 tablet  3   No current facility-administered medications for this visit.    Past Medical History  Diagnosis Date  . HTN (hypertension)   . Lung cancer     Squamous Cell  . Vertigo 2010    Hospitalized a [facility]  . Hyperlipidemia   . Lung cancer   . Loss of weight   . Pyoderma 03/12/2012  . Contact dermatitis and  other eczema due to other specified agent   . Tachycardia, unspecified 01/17/2012  . Candidiasis of mouth 01/01/2012  . Insomnia, unspecified 01/01/2012  . Malignant neoplasm of bronchus and lung, unspecified site 12/29/2011  . Hypopotassemia 12/29/2011  . Other pancytopenia 12/29/2011  . Anemia of other chronic disease 12/29/2011  . Pneumonitis due to inhalation of food or vomitus 12/29/2011  . Other esophagitis 12/29/2011    Past Surgical History  Procedure Laterality Date  . Tonsillectomy    . Video bronchoscopy  09/20/2011    Procedure: VIDEO BRONCHOSCOPY WITHOUT FLUORO;  Surgeon: Nyoka Cowden, MD;  Location: [facility];  Service: Cardiopulmonary;  Laterality: Bilateral;  . Tonsillectomy      ROS: Positive for constipation. Otherwise as stated in the HPI and negative for all other systems.   PHYSICAL EXAM BP 128/70  Pulse 75  Ht 5\' 10"  (1.778 m)  Wt 147 lb (66.679 kg)  BMI 21.09 kg/m2 GENERAL:  Well appearing HEENT:  Pupils equal round and reactive, fundi not visualized, oral mucosa unremarkable NECK:  No jugular venous distention, waveform within normal limits, carotid upstroke brisk and symmetric, B carotid bruits, no thyromegaly LYMPHATICS:  No cervical, inguinal adenopathy LUNGS:  Scattered mild inspiratory wheezes BACK:  No CVA tenderness CHEST:  Unremarkable HEART:  PMI not displaced or sustained,S1 and S2  within normal limits, no S3, no S4, no clicks, no rubs, no murmurs ABD:  Flat, positive bowel sounds normal in frequency in pitch, no bruits, no rebound, no guarding, no midline pulsatile mass, no hepatomegaly, no splenomegaly, s/p gastrostomy tube EXT:  2 plus pulses throughout, no edema, no cyanosis no clubbing, delayed capillary refill to 5 seconds on B LE.  SKIN:  No rashes no nodules PSYCH:  Cognitively intact, oriented to person place and time  EKG 10/29/2012:  Sinus rhythm, rate 75, axis within normal limits, intervals within normal limits, no acute ST-T  wave changes. Unchanged from 10/24/2011.     ASSESSMENT AND PLAN  Carotid stenosis - Given now off chemotherapy and radiation will plan to perform carotid duplex to follow known stenosis.  No evidence for symptomatic disease.  Dyslipidemia - With his carotid stenosis the goal ultimately would be an LDL less than 100 and HDL greater than 40.  Given recently elevated LDL to 143 and total of 210 will encourage resuming Lipitor if not going to be receiving further chemotherapy.  Defer to Dr. Chilton Si for further management   Hypertension - The blood pressure is at target. No change in medications is indicated. We will continue with therapeutic lifestyle changes (TLC).  Lung Cancer - Stage IIIB non small cell - Continues to be followed by Oncology.  No evidence of further  weight loss noted at this time.  Encouraged healthy eating.

## 2012-10-29 NOTE — Patient Instructions (Addendum)

## 2012-11-01 ENCOUNTER — Other Ambulatory Visit (HOSPITAL_BASED_OUTPATIENT_CLINIC_OR_DEPARTMENT_OTHER): Payer: Medicare Other | Admitting: Lab

## 2012-11-01 ENCOUNTER — Ambulatory Visit (HOSPITAL_COMMUNITY)
Admission: RE | Admit: 2012-11-01 | Discharge: 2012-11-01 | Disposition: A | Discharge status: Home / Self Care | Payer: Medicare Other | Source: Ambulatory Visit | Attending: Internal Medicine | Admitting: Internal Medicine

## 2012-11-01 DIAGNOSIS — C3491 Malignant neoplasm of unspecified part of right bronchus or lung: Secondary | ICD-10-CM

## 2012-11-01 DIAGNOSIS — Z923 Personal history of irradiation: Secondary | ICD-10-CM | POA: Insufficient documentation

## 2012-11-01 DIAGNOSIS — J438 Other emphysema: Secondary | ICD-10-CM | POA: Insufficient documentation

## 2012-11-01 DIAGNOSIS — J90 Pleural effusion, not elsewhere classified: Secondary | ICD-10-CM | POA: Insufficient documentation

## 2012-11-01 DIAGNOSIS — Z9221 Personal history of antineoplastic chemotherapy: Secondary | ICD-10-CM | POA: Insufficient documentation

## 2012-11-01 DIAGNOSIS — I700 Atherosclerosis of aorta: Secondary | ICD-10-CM | POA: Insufficient documentation

## 2012-11-01 DIAGNOSIS — C3490 Malignant neoplasm of unspecified part of unspecified bronchus or lung: Secondary | ICD-10-CM | POA: Insufficient documentation

## 2012-11-01 DIAGNOSIS — C3430 Malignant neoplasm of lower lobe, unspecified bronchus or lung: Secondary | ICD-10-CM

## 2012-11-01 DIAGNOSIS — J984 Other disorders of lung: Secondary | ICD-10-CM | POA: Insufficient documentation

## 2012-11-01 LAB — CBC WITH DIFFERENTIAL/PLATELET
BASO%: 0.3 % (ref 0.0–2.0)
Basophils Absolute: 0 10*3/uL (ref 0.0–0.1)
EOS%: 4.6 % (ref 0.0–7.0)
MCH: 28.3 pg (ref 27.2–33.4)
MCHC: 33.6 g/dL (ref 32.0–36.0)
MCV: 84.3 fL (ref 79.3–98.0)
MONO%: 10 % (ref 0.0–14.0)
RBC: 4.41 10*6/uL (ref 4.20–5.82)
RDW: 13.9 % (ref 11.0–14.6)
lymph#: 0.7 10*3/uL — ABNORMAL LOW (ref 0.9–3.3)

## 2012-11-01 LAB — COMPREHENSIVE METABOLIC PANEL (CC13)
ALT: 10 U/L (ref 0–55)
AST: 14 U/L (ref 5–34)
Albumin: 3.5 g/dL (ref 3.5–5.0)
Alkaline Phosphatase: 146 U/L (ref 40–150)
Calcium: 9.7 mg/dL (ref 8.4–10.4)
Chloride: 105 mEq/L (ref 98–109)
Potassium: 4.5 mEq/L (ref 3.5–5.1)
Sodium: 142 mEq/L (ref 136–145)
Total Protein: 7.3 g/dL (ref 6.4–8.3)

## 2012-11-01 MED ORDER — IOHEXOL 300 MG/ML  SOLN
80.0000 mL | Freq: Once | INTRAMUSCULAR | Status: AC | PRN
  Administered 2012-11-01: 80 mL via INTRAVENOUS

## 2012-11-02 ENCOUNTER — Other Ambulatory Visit: Payer: Self-pay | Admitting: Nurse Practitioner

## 2012-11-05 ENCOUNTER — Encounter: Payer: Self-pay | Admitting: Internal Medicine

## 2012-11-05 ENCOUNTER — Ambulatory Visit (HOSPITAL_BASED_OUTPATIENT_CLINIC_OR_DEPARTMENT_OTHER): Payer: Medicare Other | Admitting: Internal Medicine

## 2012-11-05 ENCOUNTER — Telehealth: Payer: Self-pay | Admitting: Internal Medicine

## 2012-11-05 VITALS — BP 110/73 | HR 83 | Temp 97.1°F | Resp 18 | Ht 70.0 in | Wt 147.3 lb

## 2012-11-05 DIAGNOSIS — C3491 Malignant neoplasm of unspecified part of right bronchus or lung: Secondary | ICD-10-CM

## 2012-11-05 DIAGNOSIS — C3430 Malignant neoplasm of lower lobe, unspecified bronchus or lung: Secondary | ICD-10-CM

## 2012-11-05 NOTE — Progress Notes (Signed)
[facility] Telephone:([phone]) [phone]   [phone]) [phone]  OFFICE PROGRESS NOTE  [facility], Justin Curt, MD [address] [place] [address]  DIAGNOSIS: Stage IIIB non-small cell lung cancer, squamous cell carcinoma with right lower lobe lung mass as well as left hilar lymphadenopathy diagnosed in August of 2013.   PRIOR THERAPY: Concurrent chemoradiation with weekly carboplatin for AUC of 2 and paclitaxel 45 mg/M2, last dose of chemotherapy was given 12/05/2011 with partial response.   CURRENT THERAPY: None.   INTERVAL HISTORY: Justin [facility]man 79 y.o. male returns to the clinic today for followup visit accompanied by his wife and son. The patient continues to complain of pain in the right side of the chest close to the lower rib cage. He was seen by his primary care physician and ultrasound of the abdomen was performed that was unremarkable for any abnormalities. The patient denied having any nausea or vomiting. He has no weight loss or night sweats. He denied having any significant shortness of breath except with exertion and has mild cough with no hemoptysis. He has repeat CT scan of the chest performed recently and he is here for evaluation and discussion of his scan results.  MEDICAL HISTORY: Past Medical History  Diagnosis Date  . HTN (hypertension)   . Lung cancer     Squamous Cell  . Vertigo 2010    Hospitalized a [facility]  . Hyperlipidemia   . Lung cancer   . Loss of weight   . Pyoderma 03/12/2012  . Contact dermatitis and other eczema due to other specified agent   . Tachycardia, unspecified 01/17/2012  . Candidiasis of mouth 01/01/2012  . Insomnia, unspecified 01/01/2012  . Malignant neoplasm of bronchus and lung, unspecified site 12/29/2011  . Hypopotassemia 12/29/2011  . Other pancytopenia 12/29/2011  . Anemia of other chronic disease 12/29/2011  . Pneumonitis due to inhalation of food or vomitus 12/29/2011  . Other esophagitis 12/29/2011     ALLERGIES:  has No Known Allergies.  MEDICATIONS:  Current Outpatient Prescriptions  Medication Sig Dispense Refill  . ENSURE (ENSURE) Take 237 mLs by mouth.      . mirtazapine (REMERON) 7.5 MG tablet TAKE TWO TABLETS BY MOUTH AT BEDTIME  60 tablet  0  . pantoprazole (PROTONIX) 40 MG tablet Take 40 mg by mouth 2 (two) times daily. To  reduce stomach acid      . tamsulosin (FLOMAX) 0.4 MG CAPS TAKE ONE CAPSULE BY MOUTH ONCE DAILY FOR PROSTATE  30 capsule  3  . atorvastatin (LIPITOR) 20 MG tablet One daily to control cholesterol  90 tablet  3  . HYDROcodone-acetaminophen (NORCO) 10-325 MG per tablet One tablet every 6 hours if needed for pain  50 tablet  3  . polyethylene glycol (MIRALAX / GLYCOLAX) packet Take 17 g by mouth daily.  14 each  0   No current facility-administered medications for this visit.    SURGICAL HISTORY:  Past Surgical History  Procedure Laterality Date  . Tonsillectomy    . Video bronchoscopy  09/20/2011    Procedure: VIDEO BRONCHOSCOPY WITHOUT FLUORO;  Surgeon: Nyoka Cowden, MD;  Location: [facility];  Service: Cardiopulmonary;  Laterality: Bilateral;  . Tonsillectomy      REVIEW OF SYSTEMS:  A comprehensive review of systems was negative except for: Constitutional: positive for fatigue Respiratory: positive for dyspnea on exertion and pleurisy/chest pain   PHYSICAL EXAMINATION: General appearance: alert, cooperative and no distress Head: Normocephalic, without obvious abnormality,  atraumatic Neck: no adenopathy Lymph nodes: Cervical, supraclavicular, and axillary nodes normal. Resp: clear to auscultation bilaterally Cardio: regular rate and rhythm, S1, S2 normal, no murmur, click, rub or gallop GI: soft, non-tender; bowel sounds normal; no masses,  no organomegaly Extremities: extremities normal, atraumatic, no cyanosis or edema Neurologic: Alert and oriented X 3, normal strength and tone. Normal symmetric reflexes. Normal coordination and  gait  ECOG PERFORMANCE STATUS: 1 - Symptomatic but completely ambulatory  Blood pressure 110/73, pulse 83, temperature 97.1 F (36.2 C), temperature source Oral, resp. rate 18, height 5\' 10"  (1.778 m), weight 147 lb 4.8 oz (66.815 kg).  LABORATORY DATA: Lab Results  Component Value Date   WBC 6.8 11/01/2012   HGB 12.5* 11/01/2012   HCT 37.2* 11/01/2012   MCV 84.3 11/01/2012   PLT 309 11/01/2012      Chemistry      Component Value Date/Time   NA 142 11/01/2012 1028   NA 142 09/19/2012 0827   NA 133* 12/28/2011 0424   K 4.5 11/01/2012 1028   K 4.4 09/19/2012 0827   CL 103 09/19/2012 0827   CL 105 08/01/2012 0945   CO2 27 11/01/2012 1028   CO2 25 09/19/2012 0827   BUN 20.5 11/01/2012 1028   BUN 24 09/19/2012 0827   BUN 22 12/28/2011 0424   CREATININE 1.3 11/01/2012 1028   CREATININE 1.36* 09/19/2012 0827      Component Value Date/Time   CALCIUM 9.7 11/01/2012 1028   CALCIUM 9.8 09/19/2012 0827   ALKPHOS 146 11/01/2012 1028   ALKPHOS 154* 09/19/2012 0827   AST 14 11/01/2012 1028   AST 16 09/19/2012 0827   ALT 10 11/01/2012 1028   ALT 9 09/19/2012 0827   BILITOT 0.48 11/01/2012 1028   BILITOT 0.3 09/19/2012 0827       RADIOGRAPHIC STUDIES: Ct Chest W Contrast  11/01/2012   *RADIOLOGY REPORT*  Clinical Data: Lung cancer diagnosed 05/13.  Chemotherapy and radiation therapy complete.  Right-sided chest and abdominal pain. Restaging.  CT CHEST WITH CONTRAST  Technique:  Multidetector CT imaging of the chest was performed following the standard protocol during bolus administration of intravenous contrast.  Contrast: 80mL OMNIPAQUE IOHEXOL 300 MG/ML  SOLN  Comparison: 08/01/2012  Findings: Lungs/pleura: New partial obstruction of the right lower lobe bronchus, including on images 31 and dash 34/series 5.  Moderate centrilobular emphysema.  Increased soft tissue density in the region of the previously described right lower lobe 1.2 cm lung nodule.  Example images 30 - 32/series 5.  This is ill-defined, but measures on  the order of 2.7 x 2.6 cm.  Radiation changes in the paramediastinal lungs are otherwise similar.  Slightly greater on the right than left. A tiny right lower lobe nodule on image 39/series 5 at 4 mm similar to slightly increased since the prior exam.  The left-sided pleural effusion has resolved.  The right-sided pleural effusion is similar.  There is inferior right pleural complex soft tissue density, highly suspicious for recurrent disease.  This measures 1.0 x 2.7 cm on image 43/series 2.  Heart/Mediastinum: No supraclavicular adenopathy.  Extensive atherosclerotic irregularity within the thoracic aorta.  Normal heart size without pericardial effusion.  Stable middle mediastinal nodes.  A low right mediastinal versus medial right pleural nodule/node measures 2.0 x 1.8 cm on image 48/series 2 and is new. No well-defined hilar adenopathy; similar right hilar soft tissue fullness which is favored to be treatment related.  Upper abdomen: Adrenal glands incompletely imaged.  No  lesions identified.  Bones/Musculoskeletal:  No acute osseous abnormality.  IMPRESSION:  1.  Findings consistent with recurrent disease within the inferior right hemithorax.  Increased right lower lobe pulmonary opacity with new right lower lobe endobronchial partial obstruction. Concurrent low right mediastinal and right pleural disease.  2.  Resolved left and persistent small right pleural effusions. 3.  Centrilobular emphysema and radiation changes.   Original Report Authenticated By: Jeronimo Greaves, M.D.   US Abdomen Limited Ruq  10/25/2012   *RADIOLOGY REPORT*  Clinical Data:  Right upper quadrant pain  LIMITED ABDOMINAL ULTRASOUND - RIGHT UPPER QUADRANT  Comparison:  02/27/2012  Findings:  Gallbladder:  No gallstones, gallbladder wall thickening, or pericholecystic fluid.  Common bile duct:  Within normal limits in caliber.  Liver:  No focal lesion identified.  Within normal limits in parenchymal echogenicity. Patent portal vein with  normal hepato pedal flow.  IMPRESSION: Negative.   Original Report Authenticated By: Judie Petit. Shick, M.D.    ASSESSMENT AND PLAN: This is a very pleasant 79 years old white male with history of stage IIIB non-small cell lung cancer status post concurrent chemoradiation with partial response and has been observation since October of 2013. The recent CT scan of the chest showed questionable disease recurrence in the inferior right hemithorax. I discussed the scan results and showed the images to the patient and his family. I recommended for him to consider repeating a PET scan as well as CT scan of the head for restaging of his disease. I will see the patient back for followup visit in less than 2 weeks for evaluation and discussion of his scan results and further recommendations regarding treatment of his condition. For pain management the patient will continue on Norco 10/325 mg on as needed basis. He was advised to call immediately if he has any concerning symptoms in the interval.  The patient voices understanding of current disease status and treatment options and is in agreement with the current care plan.  All questions were answered. The patient knows to call the clinic with any problems, questions or concerns. We can certainly see the patient much sooner if necessary.  I spent 15 minutes counseling the patient face to face. The total time spent in the appointment was 25 minutes.

## 2012-11-05 NOTE — Telephone Encounter (Signed)
gv andprinted appt sched and avs forpt...pt aware cs will call to sched ct/pet

## 2012-11-05 NOTE — Patient Instructions (Signed)
I will order repeat PET scan as well as CT scan of the head. Followup visit in 2 weeks

## 2012-11-13 ENCOUNTER — Other Ambulatory Visit (HOSPITAL_COMMUNITY): Payer: Medicare Other

## 2012-11-14 ENCOUNTER — Other Ambulatory Visit: Payer: Self-pay | Admitting: Internal Medicine

## 2012-11-14 ENCOUNTER — Encounter (HOSPITAL_COMMUNITY): Payer: Self-pay

## 2012-11-14 ENCOUNTER — Ambulatory Visit (HOSPITAL_COMMUNITY)
Admission: RE | Admit: 2012-11-14 | Discharge: 2012-11-14 | Disposition: A | Discharge status: Home / Self Care | Payer: Medicare Other | Source: Ambulatory Visit | Attending: Internal Medicine | Admitting: Internal Medicine

## 2012-11-14 ENCOUNTER — Encounter (HOSPITAL_COMMUNITY)
Admission: RE | Admit: 2012-11-14 | Discharge: 2012-11-14 | Disposition: A | Discharge status: Home / Self Care | Payer: Medicare Other | Source: Ambulatory Visit | Attending: Internal Medicine | Admitting: Internal Medicine

## 2012-11-14 DIAGNOSIS — C3491 Malignant neoplasm of unspecified part of right bronchus or lung: Secondary | ICD-10-CM

## 2012-11-14 DIAGNOSIS — R599 Enlarged lymph nodes, unspecified: Secondary | ICD-10-CM | POA: Insufficient documentation

## 2012-11-14 DIAGNOSIS — C3490 Malignant neoplasm of unspecified part of unspecified bronchus or lung: Secondary | ICD-10-CM | POA: Insufficient documentation

## 2012-11-14 DIAGNOSIS — I6789 Other cerebrovascular disease: Secondary | ICD-10-CM | POA: Insufficient documentation

## 2012-11-14 DIAGNOSIS — M799 Soft tissue disorder, unspecified: Secondary | ICD-10-CM | POA: Insufficient documentation

## 2012-11-14 DIAGNOSIS — G319 Degenerative disease of nervous system, unspecified: Secondary | ICD-10-CM | POA: Insufficient documentation

## 2012-11-14 LAB — GLUCOSE, CAPILLARY: Glucose-Capillary: 91 mg/dL (ref 70–99)

## 2012-11-14 MED ORDER — FLUDEOXYGLUCOSE F - 18 (FDG) INJECTION
20.0000 | Freq: Once | INTRAVENOUS | Status: AC | PRN
  Administered 2012-11-14: 20 via INTRAVENOUS

## 2012-11-14 MED ORDER — IOHEXOL 300 MG/ML  SOLN
100.0000 mL | Freq: Once | INTRAMUSCULAR | Status: AC | PRN
  Administered 2012-11-14: 100 mL via INTRAVENOUS

## 2012-11-19 ENCOUNTER — Telehealth: Payer: Self-pay | Admitting: *Deleted

## 2012-11-19 ENCOUNTER — Telehealth: Payer: Self-pay | Admitting: Internal Medicine

## 2012-11-19 ENCOUNTER — Encounter: Payer: Self-pay | Admitting: Internal Medicine

## 2012-11-19 ENCOUNTER — Ambulatory Visit (HOSPITAL_BASED_OUTPATIENT_CLINIC_OR_DEPARTMENT_OTHER): Payer: Medicare Other | Admitting: Internal Medicine

## 2012-11-19 VITALS — BP 107/71 | HR 86 | Temp 97.0°F | Resp 17 | Ht 70.0 in | Wt 146.1 lb

## 2012-11-19 DIAGNOSIS — C3430 Malignant neoplasm of lower lobe, unspecified bronchus or lung: Secondary | ICD-10-CM

## 2012-11-19 DIAGNOSIS — C3491 Malignant neoplasm of unspecified part of right bronchus or lung: Secondary | ICD-10-CM

## 2012-11-19 NOTE — Progress Notes (Signed)
[facility] [phone]   [phone]  OFFICE PROGRESS NOTE  GREEN, [name], MD [address] [place] [address]  DIAGNOSIS: Recurrent non-small cell lung cancer initially diagnosed as stage IIIB non-small cell lung cancer, squamous cell carcinoma with right lower lobe lung mass as well as left hilar lymphadenopathy diagnosed in August of 2013.   PRIOR THERAPY: Concurrent chemoradiation with weekly carboplatin for AUC of 2 and paclitaxel 45 mg/M2, last dose of chemotherapy was given 12/05/2011 with partial response.   CURRENT THERAPY: Systemic chemotherapy with carboplatin for AUC of 5 on day 1 and gemcitabine 1000 mg/M2 on days 1 and 8 every 3 weeks. First dose expected on 12/01/2012.  CHEMOTHERAPY INTENT: Palliative  CURRENT # OF CHEMOTHERAPY CYCLES:1  CURRENT ANTIEMETICS: Zofran, dexamethasone and Compazine  CURRENT SMOKING STATUS: Former smoker  ORAL CHEMOTHERAPY AND CONSENT: None  CURRENT BISPHOSPHONATES USE: None  PAIN MANAGEMENT: 1/10  NARCOTICS INDUCED CONSTIPATION: None  LIVING WILL AND CODE STATUS: ?   INTERVAL HISTORY: Justin Pittman 79 y.o. male returns to the clinic today for followup visit accompanied by his wife. The patient is feeling fine today with no specific complaints except for mild fatigue and very mild pain at the central right lower rib cage. He is rating his pain well with no medication. He denied having any significant weight loss or night sweats. The patient denied having any chest pain, shortness of breath, cough or hemoptysis. He denied having any nausea or vomiting, no fever or chills. He was recently found on CT scan of the chest to have questionable disease recurrence on the right lung. I ordered a PET scan as well as CT scan of the head that were performed recently and the patient is here today for evaluation and discussion of his scan results and recommendation regarding treatment of his  condition.  MEDICAL HISTORY: Past Medical History  Diagnosis Date  . HTN (hypertension)   . Lung cancer     Squamous Cell  . Vertigo 2010    Hospitalized a [facility]  . Hyperlipidemia   . Lung cancer   . Loss of weight   . Pyoderma 03/12/2012  . Contact dermatitis and other eczema due to other specified agent   . Tachycardia, unspecified 01/17/2012  . Candidiasis of mouth 01/01/2012  . Insomnia, unspecified 01/01/2012  . Malignant neoplasm of bronchus and lung, unspecified site 12/29/2011  . Hypopotassemia 12/29/2011  . Other pancytopenia 12/29/2011  . Anemia of other chronic disease 12/29/2011  . Pneumonitis due to inhalation of food or vomitus 12/29/2011  . Other esophagitis 12/29/2011    ALLERGIES:  has No Known Allergies.  MEDICATIONS:  Current Outpatient Prescriptions  Medication Sig Dispense Refill  . ENSURE (ENSURE) Take 237 mLs by mouth.      Marland Kitchen HYDROcodone-acetaminophen (NORCO) 10-325 MG per tablet One tablet every 6 hours if needed for pain  50 tablet  3  . mirtazapine (REMERON) 7.5 MG tablet TAKE TWO TABLETS BY MOUTH AT BEDTIME  60 tablet  0  . pantoprazole (PROTONIX) 40 MG tablet Take 40 mg by mouth 2 (two) times daily. To  reduce stomach acid      . tamsulosin (FLOMAX) 0.4 MG CAPS TAKE ONE CAPSULE BY MOUTH ONCE DAILY FOR PROSTATE  30 capsule  3  . atorvastatin (LIPITOR) 20 MG tablet One daily to control cholesterol  90 tablet  3  . polyethylene glycol (MIRALAX / GLYCOLAX) packet Take 17 g by mouth daily.  14 each  0   No current facility-administered medications for this visit.    SURGICAL HISTORY:  Past Surgical History  Procedure Laterality Date  . Tonsillectomy    . Video bronchoscopy  09/20/2011    Procedure: VIDEO BRONCHOSCOPY WITHOUT FLUORO;  Surgeon: Nyoka Cowden, MD;  Location: [facility];  Service: Cardiopulmonary;  Laterality: Bilateral;  . Tonsillectomy      REVIEW OF SYSTEMS:  Constitutional: negative Eyes: negative Ears, nose, mouth, throat,  and face: negative Respiratory: negative Cardiovascular: negative Gastrointestinal: negative Genitourinary:negative Integument/breast: negative Hematologic/lymphatic: negative Musculoskeletal:negative Neurological: negative Behavioral/Psych: negative Endocrine: negative Allergic/Immunologic: negative   PHYSICAL EXAMINATION: General appearance: alert, cooperative and no distress Head: Normocephalic, without obvious abnormality, atraumatic Neck: no adenopathy, no JVD, supple, symmetrical, trachea midline and thyroid not enlarged, symmetric, no tenderness/mass/nodules Lymph nodes: Cervical, supraclavicular, and axillary nodes normal. Resp: clear to auscultation bilaterally Back: symmetric, no curvature. ROM normal. No CVA tenderness. Cardio: regular rate and rhythm, S1, S2 normal, no murmur, click, rub or gallop GI: soft, non-tender; bowel sounds normal; no masses,  no organomegaly Extremities: extremities normal, atraumatic, no cyanosis or edema Neurologic: Alert and oriented X 3, normal strength and tone. Normal symmetric reflexes. Normal coordination and gait  ECOG PERFORMANCE STATUS: 1 - Symptomatic but completely ambulatory  Blood pressure 107/71, pulse 86, temperature 97 F (36.1 C), temperature source Oral, resp. rate 17, height 5\' 10"  (1.778 m), weight 146 lb 1.6 oz (66.271 kg), SpO2 97.00%.  LABORATORY DATA: Lab Results  Component Value Date   WBC 6.8 11/01/2012   HGB 12.5* 11/01/2012   HCT 37.2* 11/01/2012   MCV 84.3 11/01/2012   PLT 309 11/01/2012      Chemistry      Component Value Date/Time   NA 142 11/01/2012 1028   NA 142 09/19/2012 0827   NA 133* 12/28/2011 0424   K 4.5 11/01/2012 1028   K 4.4 09/19/2012 0827   CL 103 09/19/2012 0827   CL 105 08/01/2012 0945   CO2 27 11/01/2012 1028   CO2 25 09/19/2012 0827   BUN 20.5 11/01/2012 1028   BUN 24 09/19/2012 0827   BUN 22 12/28/2011 0424   CREATININE 1.3 11/01/2012 1028   CREATININE 1.36* 09/19/2012 0827      Component Value  Date/Time   CALCIUM 9.7 11/01/2012 1028   CALCIUM 9.8 09/19/2012 0827   ALKPHOS 146 11/01/2012 1028   ALKPHOS 154* 09/19/2012 0827   AST 14 11/01/2012 1028   AST 16 09/19/2012 0827   ALT 10 11/01/2012 1028   ALT 9 09/19/2012 0827   BILITOT 0.48 11/01/2012 1028   BILITOT 0.3 09/19/2012 0827       RADIOGRAPHIC STUDIES: Ct Head W Wo Contrast  11/14/2012   CLINICAL DATA:  Lung cancer  EXAM: CT HEAD WITHOUT AND WITH CONTRAST  TECHNIQUE: Contiguous axial images were obtained from the base of the skull through the vertex without and with intravenous contrast  CONTRAST:  OMNIPAQUE IOHEXOL 300 MG/ML  SOLN  COMPARISON:  10/27/2008  FINDINGS: Generalized atrophy. Chronic microvascular ischemic change in the white matter. No acute infarct. Negative for hemorrhage Postcontrast imaging reveals a 5 mm area of irregular enhancement in the left occipital lobe. This could be due to metastatic disease however could be vascular enhancement. Given the history, followup MRI with contrast is suggested. No other enhancing lesions are identified.  IMPRESSION: 5 mm enhancing region in the left occipital lobe. Metastatic disease versus vascular enhancement. MRI with contrast is suggested if there  no contraindications to MRI.  Atrophy and chronic microvascular ischemia.   Electronically Signed   By: Marlan Palau M.D.   On: 11/14/2012 13:23   Ct Chest W Contrast  11/01/2012   *RADIOLOGY REPORT*  Clinical Data: Lung cancer diagnosed 05/13.  Chemotherapy and radiation therapy complete.  Right-sided chest and abdominal pain. Restaging.  CT CHEST WITH CONTRAST  Technique:  Multidetector CT imaging of the chest was performed following the standard protocol during bolus administration of intravenous contrast.  Contrast: 80mL OMNIPAQUE IOHEXOL 300 MG/ML  SOLN  Comparison: 08/01/2012  Findings: Lungs/pleura: New partial obstruction of the right lower lobe bronchus, including on images 31 and dash 34/series 5.  Moderate centrilobular  emphysema.  Increased soft tissue density in the region of the previously described right lower lobe 1.2 cm lung nodule.  Example images 30 - 32/series 5.  This is ill-defined, but measures on the order of 2.7 x 2.6 cm.  Radiation changes in the paramediastinal lungs are otherwise similar.  Slightly greater on the right than left. A tiny right lower lobe nodule on image 39/series 5 at 4 mm similar to slightly increased since the prior exam.  The left-sided pleural effusion has resolved.  The right-sided pleural effusion is similar.  There is inferior right pleural complex soft tissue density, highly suspicious for recurrent disease.  This measures 1.0 x 2.7 cm on image 43/series 2.  Heart/Mediastinum: No supraclavicular adenopathy.  Extensive atherosclerotic irregularity within the thoracic aorta.  Normal heart size without pericardial effusion.  Stable middle mediastinal nodes.  A low right mediastinal versus medial right pleural nodule/node measures 2.0 x 1.8 cm on image 48/series 2 and is new. No well-defined hilar adenopathy; similar right hilar soft tissue fullness which is favored to be treatment related.  Upper abdomen: Adrenal glands incompletely imaged.  No lesions identified.  Bones/Musculoskeletal:  No acute osseous abnormality.  IMPRESSION:  1.  Findings consistent with recurrent disease within the inferior right hemithorax.  Increased right lower lobe pulmonary opacity with new right lower lobe endobronchial partial obstruction. Concurrent low right mediastinal and right pleural disease.  2.  Resolved left and persistent small right pleural effusions. 3.  Centrilobular emphysema and radiation changes.   Original Report Authenticated By: Jeronimo Greaves, M.D.   Nm Pet Image Restag (ps) Skull Base To Thigh  11/14/2012   *RADIOLOGY REPORT*  Clinical Data:  Subsequent treatment strategy for lung cancer.  NUCLEAR MEDICINE PET WHOLE BODY  Fasting Blood Glucose:  91  Technique:  20 mCi F-18 FDG was injected  intravenously. CT data was obtained and used for attenuation correction and anatomic localization only.  (This was not acquired as a diagnostic CT examination.) Additional exam technical data entered on technologist worksheet.  Comparison:  PET CT 09/05/2011, chest CT 11/01/2012  Findings:  Head/Neck:   No hypermetabolic lymph nodes in the neck.  Chest:  There is abnormally FDG avid AP window, pretracheal, and bilateral hilar lymphadenopathy.  Representative right perihilar mass/adenopathy demonstrates F D G uptake with S U V max 17.9.  Representative pretracheal FDG avid lymph node demonstrates S U V maximum 6.6.  Left AP window lymphadenopathy demonstrates S U V maximum 18.  Right lower lobe pleural-based mass demonstrates abnormal F D G uptake, S U V max 9.3.  Soft tissue mass at the level of the diaphragmatic hiatus adjacent to the esophagus with a clear intervening fat plane demonstrate F D G uptake, S U V maximum 21.  Abdomen/Pelvis:  Physiologic renal and bowel  uptake noted.  No area of abnormal F D G uptake is identified within the abdomen or pelvis.  Skeleton:  No focal hypermetabolic activity to suggest skeletal metastasis.  Extremities:  No hyper metabolic activity to suggest metastasis.  Review of CT images of the chest is as per recent diagnostic exam. No adrenal mass is identified.  Moderate atheromatous aortic calcification with mild infrarenal aortic ectasia is re-identified. Degenerative changes are noted in the hips and spine.  IMPRESSION: Multiple areas of abnormal mediastinal, bilateral hilar, right lower lobe pleural-based, and diaphragmatic hiatus peri esophageal F D G uptake, compatible with multifocal intrathoracic local metastatic and recurrent disease.  No abnormal F D G uptake in the neck, abdomen, or pelvis.   Original Report Authenticated By: Christiana Pellant, M.D.   US Abdomen Limited Ruq  10/25/2012   *RADIOLOGY REPORT*  Clinical Data:  Right upper quadrant pain  LIMITED ABDOMINAL  ULTRASOUND - RIGHT UPPER QUADRANT  Comparison:  02/27/2012  Findings:  Gallbladder:  No gallstones, gallbladder wall thickening, or pericholecystic fluid.  Common bile duct:  Within normal limits in caliber.  Liver:  No focal lesion identified.  Within normal limits in parenchymal echogenicity. Patent portal vein with normal hepato pedal flow.  IMPRESSION: Negative.   Original Report Authenticated By: Judie Petit. Shick, M.D.    ASSESSMENT AND PLAN: This is a very pleasant 79 years old white male with questionable recurrent non-small cell lung cancer mainly involving the right lung and mediastinal lymphadenopathy. I have a lengthy discussion with the patient and his wife today about his current disease stage, prognosis and treatment options. With the questionable small lesion in the brain seen on the recent CT scan of the head, I will order the MRI of the brain for further evaluation of this lesion. I discussed with the patient and his wife his treatment options including palliative care and hospice referral versus proceeding with palliative chemotherapy in the form of carboplatin for AUC of 5 on day 1 and gemcitabine 1000 mg/M2 on days 1 and 8 every 3 weeks. The patient would like to consider the systemic chemotherapy. I discussed with the patient adverse effect of this treatment including but not limited to alopecia, myelosuppression, nausea and vomiting, peripheral neuropathy, liver or renal dysfunction. The patient is expected to start the first cycle of this treatment on 11/27/2012. He would come back for followup visit in 4 weeks with the start of cycle #2. The patient was advised to call immediately if he has any concerning symptoms in the interval.  The patient voices understanding of current disease status and treatment options and is in agreement with the current care plan.  All questions were answered. The patient knows to call the clinic with any problems, questions or concerns. We can certainly see  the patient much sooner if necessary.  I spent 15 minutes counseling the patient face to face. The total time spent in the appointment was 25 minutes.

## 2012-11-19 NOTE — Telephone Encounter (Signed)
Per staff message and POF I have scheduled appts.  JMW  

## 2012-11-19 NOTE — Telephone Encounter (Signed)
gv andprinted appt sched for pt for SEpt and OCt...MW added tx.Marland KitchenMarland Kitchen

## 2012-11-19 NOTE — Patient Instructions (Signed)
We discussed treatment options including carboplatin and gemcitabine. First cycle of chemotherapy next week.

## 2012-11-22 ENCOUNTER — Ambulatory Visit
Admission: RE | Admit: 2012-11-22 | Discharge: 2012-11-22 | Disposition: A | Discharge status: Home / Self Care | Payer: Medicare Other | Source: Ambulatory Visit | Attending: Internal Medicine | Admitting: Internal Medicine

## 2012-11-22 DIAGNOSIS — C3491 Malignant neoplasm of unspecified part of right bronchus or lung: Secondary | ICD-10-CM

## 2012-11-22 MED ORDER — GADOBENATE DIMEGLUMINE 529 MG/ML IV SOLN
13.0000 mL | Freq: Once | INTRAVENOUS | Status: AC | PRN
  Administered 2012-11-22: 13 mL via INTRAVENOUS

## 2012-11-25 ENCOUNTER — Telehealth: Payer: Self-pay | Admitting: Medical Oncology

## 2012-11-25 NOTE — Telephone Encounter (Signed)
Wife wanting to know about MRI result. Per Dr Arbutus Ped i gave result to Mrs. Plack.

## 2012-11-26 ENCOUNTER — Telehealth: Payer: Self-pay | Admitting: Medical Oncology

## 2012-11-26 NOTE — Telephone Encounter (Signed)
Can pt eat prior to chemo and needs clarification about chemotherapy  Regimen. Information provided.

## 2012-11-27 ENCOUNTER — Other Ambulatory Visit: Payer: Self-pay | Admitting: Medical Oncology

## 2012-11-27 ENCOUNTER — Ambulatory Visit (HOSPITAL_BASED_OUTPATIENT_CLINIC_OR_DEPARTMENT_OTHER): Payer: Medicare Other

## 2012-11-27 ENCOUNTER — Other Ambulatory Visit (HOSPITAL_BASED_OUTPATIENT_CLINIC_OR_DEPARTMENT_OTHER): Payer: Medicare Other | Admitting: Lab

## 2012-11-27 VITALS — BP 103/67 | HR 101 | Temp 97.5°F | Resp 18

## 2012-11-27 DIAGNOSIS — C3430 Malignant neoplasm of lower lobe, unspecified bronchus or lung: Secondary | ICD-10-CM

## 2012-11-27 DIAGNOSIS — E860 Dehydration: Secondary | ICD-10-CM

## 2012-11-27 DIAGNOSIS — R110 Nausea: Secondary | ICD-10-CM

## 2012-11-27 DIAGNOSIS — C3491 Malignant neoplasm of unspecified part of right bronchus or lung: Secondary | ICD-10-CM

## 2012-11-27 DIAGNOSIS — Z5111 Encounter for antineoplastic chemotherapy: Secondary | ICD-10-CM

## 2012-11-27 LAB — COMPREHENSIVE METABOLIC PANEL (CC13)
Albumin: 3.5 g/dL (ref 3.5–5.0)
Alkaline Phosphatase: 158 U/L — ABNORMAL HIGH (ref 40–150)
BUN: 23.6 mg/dL (ref 7.0–26.0)
CO2: 28 mEq/L (ref 22–29)
Calcium: 9.7 mg/dL (ref 8.4–10.4)
Glucose: 110 mg/dl (ref 70–140)
Potassium: 4.1 mEq/L (ref 3.5–5.1)
Sodium: 140 mEq/L (ref 136–145)
Total Protein: 7.5 g/dL (ref 6.4–8.3)

## 2012-11-27 LAB — CBC WITH DIFFERENTIAL/PLATELET
Basophils Absolute: 0 10*3/uL (ref 0.0–0.1)
EOS%: 4 % (ref 0.0–7.0)
Eosinophils Absolute: 0.3 10*3/uL (ref 0.0–0.5)
HCT: 36.2 % — ABNORMAL LOW (ref 38.4–49.9)
HGB: 12 g/dL — ABNORMAL LOW (ref 13.0–17.1)
MCH: 27.6 pg (ref 27.2–33.4)
MCV: 83.4 fL (ref 79.3–98.0)
NEUT#: 5.1 10*3/uL (ref 1.5–6.5)
NEUT%: 73.5 % (ref 39.0–75.0)
RDW: 13.4 % (ref 11.0–14.6)
lymph#: 0.8 10*3/uL — ABNORMAL LOW (ref 0.9–3.3)

## 2012-11-27 MED ORDER — PROCHLORPERAZINE MALEATE 10 MG PO TABS: 10.0000 mg | ORAL_TABLET | Freq: Four times a day (QID) | ORAL | Status: DC | PRN

## 2012-11-27 MED ORDER — SODIUM CHLORIDE 0.9 % IV SOLN
INTRAVENOUS | Status: DC
  Administered 2012-11-27: 500 mL/h via INTRAVENOUS

## 2012-11-27 MED ORDER — SODIUM CHLORIDE 0.9 % IV SOLN
344.5000 mg | Freq: Once | INTRAVENOUS | Status: AC
  Administered 2012-11-27: 340 mg via INTRAVENOUS
  Filled 2012-11-27: qty 34

## 2012-11-27 MED ORDER — ONDANSETRON 16 MG/50ML IVPB (CHCC)
INTRAVENOUS | Status: AC
  Filled 2012-11-27: qty 16

## 2012-11-27 MED ORDER — SODIUM CHLORIDE 0.9 % IV SOLN
Freq: Once | INTRAVENOUS | Status: AC
  Administered 2012-11-27: 12:00:00 via INTRAVENOUS

## 2012-11-27 MED ORDER — ONDANSETRON 16 MG/50ML IVPB (CHCC)
16.0000 mg | Freq: Once | INTRAVENOUS | Status: AC
  Administered 2012-11-27: 16 mg via INTRAVENOUS

## 2012-11-27 MED ORDER — DEXAMETHASONE SODIUM PHOSPHATE 20 MG/5ML IJ SOLN
INTRAMUSCULAR | Status: AC
  Filled 2012-11-27: qty 5

## 2012-11-27 MED ORDER — DEXAMETHASONE SODIUM PHOSPHATE 20 MG/5ML IJ SOLN
20.0000 mg | Freq: Once | INTRAMUSCULAR | Status: AC
  Administered 2012-11-27: 20 mg via INTRAVENOUS

## 2012-11-27 MED ORDER — SODIUM CHLORIDE 0.9 % IV SOLN
1000.0000 mg/m2 | Freq: Once | INTRAVENOUS | Status: AC
  Administered 2012-11-27: 1824 mg via INTRAVENOUS
  Filled 2012-11-27: qty 47.97

## 2012-11-27 NOTE — Patient Instructions (Addendum)
[facility] Discharge Instructions for Patients Receiving Chemotherapy  Today you received the following chemotherapy agents Carbo, Gemzar  To help prevent nausea and vomiting after your treatment, we encourage you to take your nausea medication as prescribed.   If you develop nausea and vomiting that is not controlled by your nausea medication, call the clinic.   BELOW ARE SYMPTOMS THAT SHOULD BE REPORTED IMMEDIATELY:  *FEVER GREATER THAN 100.5 F  *CHILLS WITH OR WITHOUT FEVER  NAUSEA AND VOMITING THAT IS NOT CONTROLLED WITH YOUR NAUSEA MEDICATION  *UNUSUAL SHORTNESS OF BREATH  *UNUSUAL BRUISING OR BLEEDING  TENDERNESS IN MOUTH AND THROAT WITH OR WITHOUT PRESENCE OF ULCERS  *URINARY PROBLEMS  *BOWEL PROBLEMS  UNUSUAL RASH Items with * indicate a potential emergency and should be followed up as soon as possible.  Feel free to call the clinic you have any questions or concerns. The clinic phone number is [phone].

## 2012-11-27 NOTE — Progress Notes (Signed)
Pt in for chemo.  Upon assessment pt seemed to be weak and appeared to be not feeling well.  Pt stated that he has had some nausea and loss of appetite for 1 week.  Pt's skin turgor is poor and his mucous membranes are dry in his mouth.  Pt has a lower b/p and elevated pulse.  Wife states that this is fairly new since last seen by Dr. Arbutus Ped a week ago.  Pt does state that he is not eating or drinking good and he has had some mild nausea.  Labs are unremarkable.  Pt wants to continue with chemo.  Pt also c/o pain to right lower abdomen.  He has been on pain meds since August given to him by another physician.  Pt stated that the medication works but it takes longer and longer to "kick-in".  Dr. Arbutus Ped aware of all of the above findings.  RN to administer a liter of NS along with chemo today.  Pt and  Wife know to call if there are any further complications, questions, or concerns.

## 2012-11-29 ENCOUNTER — Telehealth: Payer: Self-pay

## 2012-11-29 NOTE — Telephone Encounter (Signed)
REVIEWED WRITTEN INFORMATION GIVEN TO PATIENT REGARDING SIDE-EFFECTS OF CHEMOTHERAPY DRUG AS STATED  IN SPECIAL TOPICS.

## 2012-11-29 NOTE — Telephone Encounter (Signed)
Mr. Mcfayden is not having any issues with nausea/vomiting  today.  He is feeling more tired than yesterday.  He has only taken in~16 oz.of fluid today. Encouraged him to get in 10 oz every 2 hours while awake.  Wife and patient verbalized understanding. They know to call [phone] if he develops any problems.

## 2012-12-02 ENCOUNTER — Telehealth: Payer: Self-pay | Admitting: *Deleted

## 2012-12-02 NOTE — Telephone Encounter (Signed)
Pt's wife called stating pt is not doing well.  He is having loose, watery stools, he vomited 3x on Friday 10/3 which resolved after taking compazine.  He also dry heeved this morning.  He does not want to get out of bed, he is very weak.  He is also not eating.  Advised for him to take imodium (instructions given), nausea medication as needed to prevent any nausea, drinking ensure and fluids to help prevent dehydration.  Offered IVF to patient, he does not want them at this time.  She will call back tomorrow if he decides he does.  Pt is to f/u with Dr Donnald Garre on 12/04/12.  Dr Donnald Garre aware of pt status.  SLJ

## 2012-12-04 ENCOUNTER — Ambulatory Visit: Payer: Medicare Other | Admitting: Internal Medicine

## 2012-12-04 ENCOUNTER — Telehealth: Payer: Self-pay | Admitting: *Deleted

## 2012-12-04 ENCOUNTER — Other Ambulatory Visit (HOSPITAL_BASED_OUTPATIENT_CLINIC_OR_DEPARTMENT_OTHER): Payer: Medicare Other

## 2012-12-04 ENCOUNTER — Telehealth: Payer: Self-pay | Admitting: Internal Medicine

## 2012-12-04 ENCOUNTER — Ambulatory Visit (HOSPITAL_BASED_OUTPATIENT_CLINIC_OR_DEPARTMENT_OTHER): Payer: Medicare Other | Admitting: Internal Medicine

## 2012-12-04 ENCOUNTER — Other Ambulatory Visit: Payer: Medicare Other | Admitting: Lab

## 2012-12-04 ENCOUNTER — Ambulatory Visit (HOSPITAL_BASED_OUTPATIENT_CLINIC_OR_DEPARTMENT_OTHER): Payer: Medicare Other

## 2012-12-04 ENCOUNTER — Encounter: Payer: Self-pay | Admitting: Internal Medicine

## 2012-12-04 VITALS — BP 132/80 | HR 92 | Temp 97.6°F | Resp 17 | Ht 70.0 in | Wt 138.4 lb

## 2012-12-04 DIAGNOSIS — C3490 Malignant neoplasm of unspecified part of unspecified bronchus or lung: Secondary | ICD-10-CM

## 2012-12-04 DIAGNOSIS — C3430 Malignant neoplasm of lower lobe, unspecified bronchus or lung: Secondary | ICD-10-CM

## 2012-12-04 DIAGNOSIS — Z5111 Encounter for antineoplastic chemotherapy: Secondary | ICD-10-CM

## 2012-12-04 DIAGNOSIS — C3491 Malignant neoplasm of unspecified part of right bronchus or lung: Secondary | ICD-10-CM

## 2012-12-04 DIAGNOSIS — R630 Anorexia: Secondary | ICD-10-CM

## 2012-12-04 DIAGNOSIS — R112 Nausea with vomiting, unspecified: Secondary | ICD-10-CM

## 2012-12-04 DIAGNOSIS — R634 Abnormal weight loss: Secondary | ICD-10-CM

## 2012-12-04 DIAGNOSIS — R5381 Other malaise: Secondary | ICD-10-CM

## 2012-12-04 LAB — COMPREHENSIVE METABOLIC PANEL (CC13)
ALT: 10 U/L (ref 0–55)
Albumin: 3.2 g/dL — ABNORMAL LOW (ref 3.5–5.0)
Anion Gap: 9 mEq/L (ref 3–11)
CO2: 26 mEq/L (ref 22–29)
Chloride: 101 mEq/L (ref 98–109)
Glucose: 126 mg/dl (ref 70–140)
Potassium: 3.3 mEq/L — ABNORMAL LOW (ref 3.5–5.1)
Sodium: 136 mEq/L (ref 136–145)
Total Bilirubin: 0.64 mg/dL (ref 0.20–1.20)
Total Protein: 6.5 g/dL (ref 6.4–8.3)

## 2012-12-04 LAB — CBC WITH DIFFERENTIAL/PLATELET
Basophils Absolute: 0 10*3/uL (ref 0.0–0.1)
Eosinophils Absolute: 0 10*3/uL (ref 0.0–0.5)
HGB: 11.9 g/dL — ABNORMAL LOW (ref 13.0–17.1)
MONO#: 0.1 10*3/uL (ref 0.1–0.9)
MONO%: 1.6 % (ref 0.0–14.0)
NEUT#: 2.4 10*3/uL (ref 1.5–6.5)
RBC: 4.33 10*6/uL (ref 4.20–5.82)
RDW: 12.8 % (ref 11.0–14.6)
WBC: 3.1 10*3/uL — ABNORMAL LOW (ref 4.0–10.3)
lymph#: 0.6 10*3/uL — ABNORMAL LOW (ref 0.9–3.3)
nRBC: 0 % (ref 0–0)

## 2012-12-04 MED ORDER — SODIUM CHLORIDE 0.9 % IV SOLN
Freq: Once | INTRAVENOUS | Status: AC
  Administered 2012-12-04: 14:00:00 via INTRAVENOUS

## 2012-12-04 MED ORDER — SODIUM CHLORIDE 0.9 % IV SOLN
1000.0000 mg/m2 | Freq: Once | INTRAVENOUS | Status: AC
  Administered 2012-12-04: 1824 mg via INTRAVENOUS
  Filled 2012-12-04: qty 47.97

## 2012-12-04 MED ORDER — PROCHLORPERAZINE MALEATE 10 MG PO TABS
ORAL_TABLET | ORAL | Status: AC
  Filled 2012-12-04: qty 1

## 2012-12-04 MED ORDER — PROCHLORPERAZINE MALEATE 10 MG PO TABS
10.0000 mg | ORAL_TABLET | Freq: Once | ORAL | Status: AC
  Administered 2012-12-04: 10 mg via ORAL

## 2012-12-04 NOTE — Patient Instructions (Signed)
[facility] Discharge Instructions for Patients Receiving Chemotherapy  Today you received the following chemotherapy agents:  Gemzar  To help prevent nausea and vomiting after your treatment, we encourage you to take your nausea medication as ordered per MD.   If you develop nausea and vomiting that is not controlled by your nausea medication, call the clinic.   BELOW ARE SYMPTOMS THAT SHOULD BE REPORTED IMMEDIATELY:  *FEVER GREATER THAN 100.5 F  *CHILLS WITH OR WITHOUT FEVER  NAUSEA AND VOMITING THAT IS NOT CONTROLLED WITH YOUR NAUSEA MEDICATION  *UNUSUAL SHORTNESS OF BREATH  *UNUSUAL BRUISING OR BLEEDING  TENDERNESS IN MOUTH AND THROAT WITH OR WITHOUT PRESENCE OF ULCERS  *URINARY PROBLEMS  *BOWEL PROBLEMS  UNUSUAL RASH Items with * indicate a potential emergency and should be followed up as soon as possible.  Feel free to call the clinic you have any questions or concerns. The clinic phone number is [phone].    

## 2012-12-04 NOTE — Progress Notes (Signed)
[facility] [phone]   [phone]  OFFICE PROGRESS NOTE  Pittman, Justin Curt, MD [address] [place] [address]  DIAGNOSIS: Recurrent non-small cell lung cancer initially diagnosed as stage IIIB non-small cell lung cancer, squamous cell carcinoma with right lower lobe lung mass as well as left hilar lymphadenopathy diagnosed in August of 2013.   PRIOR THERAPY: Concurrent chemoradiation with weekly carboplatin for AUC of 2 and paclitaxel 45 mg/M2, last dose of chemotherapy was given 12/05/2011 with partial response.   CURRENT THERAPY: Systemic chemotherapy with carboplatin for AUC of 5 on day 1 and gemcitabine 1000 mg/M2 on days 1 and 8 every 3 weeks. First dose expected on 12/01/2012.   CHEMOTHERAPY INTENT: Palliative  CURRENT # OF CHEMOTHERAPY CYCLES:1  CURRENT ANTIEMETICS: Zofran, dexamethasone and Compazine  CURRENT SMOKING STATUS: Former smoker  ORAL CHEMOTHERAPY AND CONSENT: None  CURRENT BISPHOSPHONATES USE: None   PAIN MANAGEMENT: 5/10 currently on Norco. NARCOTICS INDUCED CONSTIPATION: None  LIVING WILL AND CODE STATUS: ?   INTERVAL HISTORY: Justin Pittman 79 y.o. male returns to the clinic today for followup visit accompanied his wife and son. The patient continues to complain of increasing fatigue and weakness as well as lack of appetite and pain on the right lower rib cage. His pain is currently controlled with Norco 10/325 mg by mouth every 6 hours. The patient had mild nausea and vomiting after his first dose of the chemotherapy. He has a lot of questions today about the value of proceeding with the chemotherapy as well as his prognosis. He denied having any significant shortness of breath except with exertion, no cough or hemoptysis. He lost few pounds since his last visit. The patient denied having any fever or chills.  MEDICAL HISTORY: Past Medical History  Diagnosis Date  . HTN (hypertension)   . Lung cancer    Squamous Cell  . Vertigo 2010    Hospitalized a [facility]  . Hyperlipidemia   . Lung cancer   . Loss of weight   . Pyoderma 03/12/2012  . Contact dermatitis and other eczema due to other specified agent   . Tachycardia, unspecified 01/17/2012  . Candidiasis of mouth 01/01/2012  . Insomnia, unspecified 01/01/2012  . Malignant neoplasm of bronchus and lung, unspecified site 12/29/2011  . Hypopotassemia 12/29/2011  . Other pancytopenia 12/29/2011  . Anemia of other chronic disease 12/29/2011  . Pneumonitis due to inhalation of food or vomitus 12/29/2011  . Other esophagitis 12/29/2011    ALLERGIES:  has No Known Allergies.  MEDICATIONS:  Current Outpatient Prescriptions  Medication Sig Dispense Refill  . ENSURE (ENSURE) Take 237 mLs by mouth.      Marland Kitchen HYDROcodone-acetaminophen (NORCO) 10-325 MG per tablet One tablet every 6 hours if needed for pain  50 tablet  3  . mirtazapine (REMERON) 7.5 MG tablet TAKE TWO TABLETS BY MOUTH AT BEDTIME  60 tablet  0  . pantoprazole (PROTONIX) 40 MG tablet Take 40 mg by mouth 2 (two) times daily. To  reduce stomach acid      . prochlorperazine (COMPAZINE) 10 MG tablet Take 1 tablet (10 mg total) by mouth every 6 (six) hours as needed.  30 tablet  0  . tamsulosin (FLOMAX) 0.4 MG CAPS TAKE ONE CAPSULE BY MOUTH ONCE DAILY FOR PROSTATE  30 capsule  3   No current facility-administered medications for this visit.    SURGICAL HISTORY:  Past Surgical History  Procedure Laterality Date  . Tonsillectomy    .  Video bronchoscopy  09/20/2011    Procedure: VIDEO BRONCHOSCOPY WITHOUT FLUORO;  Surgeon: Nyoka Cowden, MD;  Location: [facility];  Service: Cardiopulmonary;  Laterality: Bilateral;  . Tonsillectomy      REVIEW OF SYSTEMS:  Constitutional: positive for anorexia, fatigue and weight loss Eyes: negative Ears, nose, mouth, throat, and face: negative Respiratory: positive for dyspnea on exertion and pleurisy/chest pain Cardiovascular:  negative Gastrointestinal: negative Genitourinary:negative Integument/breast: negative Hematologic/lymphatic: negative Musculoskeletal:negative Neurological: negative Behavioral/Psych: negative Endocrine: negative Allergic/Immunologic: negative   PHYSICAL EXAMINATION: General appearance: alert, cooperative, fatigued and no distress Head: Normocephalic, without obvious abnormality, atraumatic Neck: no adenopathy, no JVD, supple, symmetrical, trachea midline and thyroid not enlarged, symmetric, no tenderness/mass/nodules Lymph nodes: Cervical, supraclavicular, and axillary nodes normal. Resp: clear to auscultation bilaterally Back: symmetric, no curvature. ROM normal. No CVA tenderness. Cardio: regular rate and rhythm, S1, S2 normal, no murmur, click, rub or gallop GI: soft, non-tender; bowel sounds normal; no masses,  no organomegaly Extremities: extremities normal, atraumatic, no cyanosis or edema Neurologic: Alert and oriented X 3, normal strength and tone. Normal symmetric reflexes. Normal coordination and gait  ECOG PERFORMANCE STATUS: 2 - Symptomatic, <50% confined to bed  Blood pressure 132/80, pulse 92, temperature 97.6 F (36.4 C), temperature source Oral, resp. rate 17, height 5\' 10"  (1.778 m), weight 138 lb 6.4 oz (62.778 kg), SpO2 99.00%.  LABORATORY DATA: Lab Results  Component Value Date   WBC 3.1* 12/04/2012   HGB 11.9* 12/04/2012   HCT 34.5* 12/04/2012   MCV 79.7 12/04/2012   PLT 137* 12/04/2012      Chemistry      Component Value Date/Time   NA 140 11/27/2012 1032   NA 142 09/19/2012 0827   NA 133* 12/28/2011 0424   K 4.1 11/27/2012 1032   K 4.4 09/19/2012 0827   CL 103 09/19/2012 0827   CL 105 08/01/2012 0945   CO2 28 11/27/2012 1032   CO2 25 09/19/2012 0827   BUN 23.6 11/27/2012 1032   BUN 24 09/19/2012 0827   BUN 22 12/28/2011 0424   CREATININE 1.2 11/27/2012 1032   CREATININE 1.36* 09/19/2012 0827      Component Value Date/Time   CALCIUM 9.7 11/27/2012 1032    CALCIUM 9.8 09/19/2012 0827   ALKPHOS 158* 11/27/2012 1032   ALKPHOS 154* 09/19/2012 0827   AST 13 11/27/2012 1032   AST 16 09/19/2012 0827   ALT 7 11/27/2012 1032   ALT 9 09/19/2012 0827   BILITOT 0.46 11/27/2012 1032   BILITOT 0.3 09/19/2012 0827       RADIOGRAPHIC STUDIES: Ct Head W Wo Contrast  11/14/2012   CLINICAL DATA:  Lung cancer  EXAM: CT HEAD WITHOUT AND WITH CONTRAST  TECHNIQUE: Contiguous axial images were obtained from the base of the skull through the vertex without and with intravenous contrast  CONTRAST:  OMNIPAQUE IOHEXOL 300 MG/ML  SOLN  COMPARISON:  10/27/2008  FINDINGS: Generalized atrophy. Chronic microvascular ischemic change in the white matter. No acute infarct. Negative for hemorrhage Postcontrast imaging reveals a 5 mm area of irregular enhancement in the left occipital lobe. This could be due to metastatic disease however could be vascular enhancement. Given the history, followup MRI with contrast is suggested. No other enhancing lesions are identified.  IMPRESSION: 5 mm enhancing region in the left occipital lobe. Metastatic disease versus vascular enhancement. MRI with contrast is suggested if there no contraindications to MRI.  Atrophy and chronic microvascular ischemia.   Electronically Signed   By: Leonette Most  Chestine Spore M.D.   On: 11/14/2012 13:23   Mr Laqueta Jean NF Contrast  11/22/2012   *RADIOLOGY REPORT*  Clinical Data: Non-small cell lung cancer. Staging. Possible new brain lesions.  MRI HEAD WITHOUT AND WITH CONTRAST  Technique:  Multiplanar, multiecho pulse sequences of the brain and surrounding structures were obtained according to standard protocol without and with intravenous contrast  Contrast: 13mL MULTIHANCE GADOBENATE DIMEGLUMINE 529 MG/ML IV SOLN  Comparison:  MRI brain 10/26/2011.  CT head 11/14/2012.  Findings: There is no evidence for acute infarction, intracranial hemorrhage, mass lesion, hydrocephalus, or extra-axial fluid. There is moderate atrophy with  chronic microvascular ischemic change, likely sequelae of hypertension.  No foci of chronic hemorrhage.  Scattered areas of lacunar infarction representing chronic ischemia are seen.  Flow voids are maintained.  No large vessel infarct.  Post infusion, there is enhancement of a periventricular venous angioma in the left occipital pole.  This corresponds to the enhancement noted on recent CT.  This area is unchanged from 2013. No intra-axial metastatic deposits are demonstrated.  There are no calvarial lesions.  Major venous sinuses are patent.  No osseous lesions.  Upper cervical region unremarkable.  Normal pituitary and cerebellar tonsils.  No acute sinus or mastoid disease.  Negative appearing orbits.  IMPRESSION: No intracranial metastatic disease is evident.  Incidental left occipital pole periventricular enhancing lesion representing a venous angioma is stable from 2013.   Original Report Authenticated By: Davonna Belling, M.D.   Nm Pet Image Restag (ps) Skull Base To Thigh  11/14/2012   *RADIOLOGY REPORT*  Clinical Data:  Subsequent treatment strategy for lung cancer.  NUCLEAR MEDICINE PET WHOLE BODY  Fasting Blood Glucose:  91  Technique:  20 mCi F-18 FDG was injected intravenously. CT data was obtained and used for attenuation correction and anatomic localization only.  (This was not acquired as a diagnostic CT examination.) Additional exam technical data entered on technologist worksheet.  Comparison:  PET CT 09/05/2011, chest CT 11/01/2012  Findings:  Head/Neck:   No hypermetabolic lymph nodes in the neck.  Chest:  There is abnormally FDG avid AP window, pretracheal, and bilateral hilar lymphadenopathy.  Representative right perihilar mass/adenopathy demonstrates F D G uptake with S U V max 17.9.  Representative pretracheal FDG avid lymph node demonstrates S U V maximum 6.6.  Left AP window lymphadenopathy demonstrates S U V maximum 18.  Right lower lobe pleural-based mass demonstrates abnormal F D G  uptake, S U V max 9.3.  Soft tissue mass at the level of the diaphragmatic hiatus adjacent to the esophagus with a clear intervening fat plane demonstrate F D G uptake, S U V maximum 21.  Abdomen/Pelvis:  Physiologic renal and bowel uptake noted.  No area of abnormal F D G uptake is identified within the abdomen or pelvis.  Skeleton:  No focal hypermetabolic activity to suggest skeletal metastasis.  Extremities:  No hyper metabolic activity to suggest metastasis.  Review of CT images of the chest is as per recent diagnostic exam. No adrenal mass is identified.  Moderate atheromatous aortic calcification with mild infrarenal aortic ectasia is re-identified. Degenerative changes are noted in the hips and spine.  IMPRESSION: Multiple areas of abnormal mediastinal, bilateral hilar, right lower lobe pleural-based, and diaphragmatic hiatus peri esophageal F D G uptake, compatible with multifocal intrathoracic local metastatic and recurrent disease.  No abnormal F D G uptake in the neck, abdomen, or pelvis.   Original Report Authenticated By: Christiana Pellant, M.D.    ASSESSMENT  AND PLAN: This is a very pleasant 79 years old white male with recurrent non-small cell lung cancer, squamous cell carcinoma started recently on systemic chemotherapy with carboplatin and gemcitabine status post one dose a week ago. The patient has increasing fatigue and weakness as well as nausea and occasional vomiting, as well as lack of appetite and weight loss. His nausea and vomiting improved after he start taking his anti-emetic, Compazine as prescribed. I have a lengthy discussion with the patient and his family today about his current condition. I gave him the option of proceeding with his chemotherapy as scheduled than taking his nausea medication as needed. I also encouraged him to increase his by mouth intake. I also gave the patient the option of discontinuing chemotherapy and consideration of palliative care. A third option  discussed with the patient today was to hold the chemotherapy, and referred the patient to Dr. Kathrynn Running for consideration of palliative radiotherapy to the right lower lobe pleural based nodule that causing his lower rib cage pain. The patient mentions that he is currently tolerating his pain well with Norco.  He would like to continue with the chemotherapy today as scheduled. I will arrange for the patient to received 1 L of normal saline with his chemotherapy. He would come back for followup visit in 2 weeks with the start of cycle #2. He was advised to call immediately if he has any concerning symptoms in the interval. The patient voices understanding of current disease status and treatment options and is in agreement with the current care plan.  All questions were answered. The patient knows to call the clinic with any problems, questions or concerns. We can certainly see the patient much sooner if necessary.  I spent 20 minutes counseling the patient face to face. The total time spent in the appointment was 30 minutes.

## 2012-12-04 NOTE — Telephone Encounter (Signed)
gv and printed appt sched and avs for pt for OCT and NOV...emaield MW to add tx °

## 2012-12-04 NOTE — Patient Instructions (Signed)
CURRENT THERAPY: Systemic chemotherapy with carboplatin for AUC of 5 on day 1 and gemcitabine 1000 mg/M2 on days 1 and 8 every 3 weeks. First dose expected on 12/01/2012.  CHEMOTHERAPY INTENT: Palliative  CURRENT # OF CHEMOTHERAPY CYCLES:1  CURRENT ANTIEMETICS: Zofran, dexamethasone and Compazine  CURRENT SMOKING STATUS: Former smoker  ORAL CHEMOTHERAPY AND CONSENT: None  CURRENT BISPHOSPHONATES USE: None  PAIN MANAGEMENT: 5/10 currently on Norco.  NARCOTICS INDUCED CONSTIPATION: None  LIVING WILL AND CODE STATUS: ?

## 2012-12-04 NOTE — Telephone Encounter (Signed)
Per staff message and POF I have scheduled appts.  JMW  

## 2012-12-09 ENCOUNTER — Other Ambulatory Visit: Payer: Self-pay | Admitting: *Deleted

## 2012-12-09 DIAGNOSIS — G8929 Other chronic pain: Secondary | ICD-10-CM

## 2012-12-09 DIAGNOSIS — C3491 Malignant neoplasm of unspecified part of right bronchus or lung: Secondary | ICD-10-CM

## 2012-12-09 MED ORDER — MIRTAZAPINE 7.5 MG PO TABS: ORAL_TABLET | ORAL | Status: DC

## 2012-12-09 MED ORDER — HYDROCODONE-ACETAMINOPHEN 10-325 MG PO TABS: ORAL_TABLET | ORAL | Status: DC

## 2012-12-11 ENCOUNTER — Telehealth: Payer: Self-pay | Admitting: *Deleted

## 2012-12-11 ENCOUNTER — Telehealth: Payer: Self-pay | Admitting: Medical Oncology

## 2012-12-11 ENCOUNTER — Other Ambulatory Visit (HOSPITAL_BASED_OUTPATIENT_CLINIC_OR_DEPARTMENT_OTHER): Payer: Medicare Other | Admitting: Lab

## 2012-12-11 DIAGNOSIS — C3491 Malignant neoplasm of unspecified part of right bronchus or lung: Secondary | ICD-10-CM

## 2012-12-11 DIAGNOSIS — C3490 Malignant neoplasm of unspecified part of unspecified bronchus or lung: Secondary | ICD-10-CM

## 2012-12-11 LAB — COMPREHENSIVE METABOLIC PANEL (CC13)
AST: 17 U/L (ref 5–34)
Albumin: 3.4 g/dL — ABNORMAL LOW (ref 3.5–5.0)
Anion Gap: 12 mEq/L — ABNORMAL HIGH (ref 3–11)
CO2: 27 mEq/L (ref 22–29)
Glucose: 107 mg/dl (ref 70–140)
Sodium: 139 mEq/L (ref 136–145)
Total Bilirubin: 0.8 mg/dL (ref 0.20–1.20)
Total Protein: 7.2 g/dL (ref 6.4–8.3)

## 2012-12-11 LAB — CBC WITH DIFFERENTIAL/PLATELET
Eosinophils Absolute: 0 10*3/uL (ref 0.0–0.5)
HCT: 29.1 % — ABNORMAL LOW (ref 38.4–49.9)
HGB: 10 g/dL — ABNORMAL LOW (ref 13.0–17.1)
LYMPH%: 64.7 % — ABNORMAL HIGH (ref 14.0–49.0)
MCH: 27.3 pg (ref 27.2–33.4)
MONO#: 0 10*3/uL — ABNORMAL LOW (ref 0.1–0.9)
MONO%: 2 % (ref 0.0–14.0)
NEUT#: 0.2 10*3/uL — CL (ref 1.5–6.5)
Platelets: 23 10*3/uL — ABNORMAL LOW (ref 140–400)
RBC: 3.66 10*6/uL — ABNORMAL LOW (ref 4.20–5.82)
WBC: 0.5 10*3/uL — CL (ref 4.0–10.3)

## 2012-12-11 MED ORDER — CIPROFLOXACIN HCL 500 MG PO TABS: 500.0000 mg | ORAL_TABLET | Freq: Two times a day (BID) | ORAL | Status: DC

## 2012-12-11 NOTE — Telephone Encounter (Signed)
Wife asked for patient's appointments to be changed to Tuesdays.  With his last treatments this request was made.  Her son is off on Tuesdays and can drive him in for his appointments.  She does not usually drive ,is trying but if she can't her son is available on Tuesdays.  Will notify providers.

## 2012-12-11 NOTE — Telephone Encounter (Addendum)
Walk in form brought to triage  With patient reporting rash and rt. hand shaking.  Left building and reached via mobile.  D8 C1 Carboplatin Gemzar received on 12-04-2012.   1. Wife Marylu Lund reports red, raised, itchy rash started about four days ago to chest, spread to left arm, back and now right arm.  Has applied baby lotion and it is starting to "dry up but the rt arm is worse".   2. Report she noticed his rt arm trembling or shaking two days ago.  It last a few minutes.  He holds the rt hand with his left to stop it.    Will notify Providers.  Next F/U is 12-18-2012.     Verbal order received and read back from Dr. Arbutus Ped for patient to use hydrocortisone cream and benadryl for rash.  No further orders.  Called patient's wife with these orders.

## 2012-12-11 NOTE — Telephone Encounter (Signed)
Reviewed neutropenic precautions with pts wife who verbalized understanding.

## 2012-12-11 NOTE — Telephone Encounter (Signed)
Called to pt wife and pharmacy.

## 2012-12-16 ENCOUNTER — Telehealth: Payer: Self-pay | Admitting: *Deleted

## 2012-12-16 ENCOUNTER — Ambulatory Visit (HOSPITAL_BASED_OUTPATIENT_CLINIC_OR_DEPARTMENT_OTHER): Payer: Medicare Other

## 2012-12-16 ENCOUNTER — Other Ambulatory Visit: Payer: Self-pay | Admitting: *Deleted

## 2012-12-16 DIAGNOSIS — R634 Abnormal weight loss: Secondary | ICD-10-CM

## 2012-12-16 DIAGNOSIS — R5381 Other malaise: Secondary | ICD-10-CM

## 2012-12-16 DIAGNOSIS — R112 Nausea with vomiting, unspecified: Secondary | ICD-10-CM

## 2012-12-16 DIAGNOSIS — C3490 Malignant neoplasm of unspecified part of unspecified bronchus or lung: Secondary | ICD-10-CM

## 2012-12-16 DIAGNOSIS — C3430 Malignant neoplasm of lower lobe, unspecified bronchus or lung: Secondary | ICD-10-CM

## 2012-12-16 MED ORDER — SODIUM CHLORIDE 0.9 % IV SOLN
Freq: Once | INTRAVENOUS | Status: AC
  Administered 2012-12-16: 14:00:00 via INTRAVENOUS

## 2012-12-16 NOTE — Telephone Encounter (Signed)
Per Dr Donnald Garre, okay for pt to get IVF today, then patient can come to f/u appt as already scheduled on 10/22.  Pt's wife verbalized understanding.  SLJ

## 2012-12-16 NOTE — Patient Instructions (Signed)
Dehydration, Adult Dehydration is when you lose more fluids from the body than you take in. Vital organs like the kidneys, brain, and heart cannot function without a proper amount of fluids and salt. Any loss of fluids from the body can cause dehydration.  CAUSES   Vomiting.  Diarrhea.  Excessive sweating.  Excessive urine output.  Fever. SYMPTOMS  Mild dehydration  Thirst.  Dry lips.  Slightly dry mouth. Moderate dehydration  Very dry mouth.  Sunken eyes.  Skin does not bounce back quickly when lightly pinched and released.  Dark urine and decreased urine production.  Decreased tear production.  Headache. Severe dehydration  Very dry mouth.  Extreme thirst.  Rapid, weak pulse (more than 100 beats per minute at rest).  Cold hands and feet.  Not able to sweat in spite of heat and temperature.  Rapid breathing.  Blue lips.  Confusion and lethargy.  Difficulty being awakened.  Minimal urine production.  No tears. DIAGNOSIS  Your caregiver will diagnose dehydration based on your symptoms and your exam. Blood and urine tests will help confirm the diagnosis. The diagnostic evaluation should also identify the cause of dehydration. TREATMENT  Treatment of mild or moderate dehydration can often be done at home by increasing the amount of fluids that you drink. It is best to drink small amounts of fluid more often. Drinking too much at one time can make vomiting worse. Refer to the home care instructions below. Severe dehydration needs to be treated at the hospital where you will probably be given intravenous (IV) fluids that contain water and electrolytes. HOME CARE INSTRUCTIONS   Ask your caregiver about specific rehydration instructions.  Drink enough fluids to keep your urine clear or pale yellow.  Drink small amounts frequently if you have nausea and vomiting.  Eat as you normally do.  Avoid:  Foods or drinks high in sugar.  Carbonated  drinks.  Juice.  Extremely hot or cold fluids.  Drinks with caffeine.  Fatty, greasy foods.  Alcohol.  Tobacco.  Overeating.  Gelatin desserts.  Wash your hands well to avoid spreading bacteria and viruses.  Only take over-the-counter or prescription medicines for pain, discomfort, or fever as directed by your caregiver.  Ask your caregiver if you should continue all prescribed and over-the-counter medicines.  Keep all follow-up appointments with your caregiver. SEEK MEDICAL CARE IF:  You have abdominal pain and it increases or stays in one area (localizes).  You have a rash, stiff neck, or severe headache.  You are irritable, sleepy, or difficult to awaken.  You are weak, dizzy, or extremely thirsty. SEEK IMMEDIATE MEDICAL CARE IF:   You are unable to keep fluids down or you get worse despite treatment.  You have frequent episodes of vomiting or diarrhea.  You have blood or green matter (bile) in your vomit.  You have blood in your stool or your stool looks black and tarry.  You have not urinated in 6 to 8 hours, or you have only urinated a small amount of very dark urine.  You have a fever.  You faint. MAKE SURE YOU:   Understand these instructions.  Will watch your condition.  Will get help right away if you are not doing well or get worse. Document Released: 02/13/2005 Document Revised: 05/08/2011 Document Reviewed: 10/03/2010 Exit[employer] Patient Information 2014 [employer].  

## 2012-12-16 NOTE — Telephone Encounter (Signed)
PT.'S TEMPERATURE IS 96.5. AT 9:00AM B/P 106/68 PULSE 105. AT 12 NOON B/P 117/83 PULSE 116. PT. IS EATING VERY LITTLE. IN THE PAST 24 HOURS PT.'S FLUID INTAKE HAS BEEN 16 OUNCES. LAST NIGHT THE PT. WAS DIZZY AND LOWER HIMSELF TO THE FLOOR. THE ON CALL PHYSICIAN RECOMMENDED THAT PT. GO TO THE EMERGENCY. PT. REFUSED TO GO TO THE EMERGENCY ROOM. WOULD LIKE TO BE SEEN TODAY AT THE CANCER CENTER INSTEAD. THIS NOTE TO DR.MOHAMED'S NURSE, STEPHANIE JOHNSON,RN.

## 2012-12-16 NOTE — Progress Notes (Signed)
Pt's wife is requesting a wheelchair and walker for the home.  Orders placed in EPIC.  SLJ

## 2012-12-17 ENCOUNTER — Other Ambulatory Visit: Payer: Self-pay | Admitting: Internal Medicine

## 2012-12-17 DIAGNOSIS — C3490 Malignant neoplasm of unspecified part of unspecified bronchus or lung: Secondary | ICD-10-CM

## 2012-12-18 ENCOUNTER — Encounter (HOSPITAL_COMMUNITY): Payer: Self-pay | Admitting: Emergency Medicine

## 2012-12-18 ENCOUNTER — Other Ambulatory Visit: Payer: Medicare Other | Admitting: Lab

## 2012-12-18 ENCOUNTER — Ambulatory Visit: Payer: Medicare Other | Admitting: Physician Assistant

## 2012-12-18 ENCOUNTER — Ambulatory Visit: Payer: Medicare Other

## 2012-12-18 ENCOUNTER — Inpatient Hospital Stay (HOSPITAL_COMMUNITY)
Admission: EM | Admit: 2012-12-18 | Discharge: 2012-12-21 | DRG: 193 | Disposition: A | Discharge status: Home / Self Care | Payer: Medicare Other | Attending: Internal Medicine | Admitting: Internal Medicine

## 2012-12-18 ENCOUNTER — Other Ambulatory Visit: Payer: Self-pay

## 2012-12-18 ENCOUNTER — Emergency Department (HOSPITAL_COMMUNITY): Payer: Medicare Other

## 2012-12-18 ENCOUNTER — Telehealth: Payer: Self-pay | Admitting: *Deleted

## 2012-12-18 DIAGNOSIS — R1110 Vomiting, unspecified: Secondary | ICD-10-CM

## 2012-12-18 DIAGNOSIS — E860 Dehydration: Secondary | ICD-10-CM

## 2012-12-18 DIAGNOSIS — D899 Disorder involving the immune mechanism, unspecified: Secondary | ICD-10-CM | POA: Diagnosis present

## 2012-12-18 DIAGNOSIS — E871 Hypo-osmolality and hyponatremia: Secondary | ICD-10-CM | POA: Diagnosis present

## 2012-12-18 DIAGNOSIS — Z791 Long term (current) use of non-steroidal anti-inflammatories (NSAID): Secondary | ICD-10-CM

## 2012-12-18 DIAGNOSIS — E46 Unspecified protein-calorie malnutrition: Secondary | ICD-10-CM

## 2012-12-18 DIAGNOSIS — C3490 Malignant neoplasm of unspecified part of unspecified bronchus or lung: Secondary | ICD-10-CM | POA: Diagnosis present

## 2012-12-18 DIAGNOSIS — Z79899 Other long term (current) drug therapy: Secondary | ICD-10-CM

## 2012-12-18 DIAGNOSIS — E43 Unspecified severe protein-calorie malnutrition: Secondary | ICD-10-CM | POA: Diagnosis present

## 2012-12-18 DIAGNOSIS — J189 Pneumonia, unspecified organism: Secondary | ICD-10-CM

## 2012-12-18 DIAGNOSIS — D638 Anemia in other chronic diseases classified elsewhere: Secondary | ICD-10-CM

## 2012-12-18 DIAGNOSIS — I10 Essential (primary) hypertension: Secondary | ICD-10-CM | POA: Diagnosis present

## 2012-12-18 DIAGNOSIS — E785 Hyperlipidemia, unspecified: Secondary | ICD-10-CM | POA: Diagnosis present

## 2012-12-18 DIAGNOSIS — J069 Acute upper respiratory infection, unspecified: Secondary | ICD-10-CM | POA: Diagnosis present

## 2012-12-18 DIAGNOSIS — R531 Weakness: Secondary | ICD-10-CM

## 2012-12-18 DIAGNOSIS — D649 Anemia, unspecified: Secondary | ICD-10-CM

## 2012-12-18 DIAGNOSIS — Z87891 Personal history of nicotine dependence: Secondary | ICD-10-CM

## 2012-12-18 DIAGNOSIS — R627 Adult failure to thrive: Secondary | ICD-10-CM

## 2012-12-18 DIAGNOSIS — IMO0002 Reserved for concepts with insufficient information to code with codable children: Secondary | ICD-10-CM

## 2012-12-18 DIAGNOSIS — D709 Neutropenia, unspecified: Secondary | ICD-10-CM

## 2012-12-18 LAB — COMPREHENSIVE METABOLIC PANEL
ALT: 14 U/L (ref 0–53)
Albumin: 3.4 g/dL — ABNORMAL LOW (ref 3.5–5.2)
CO2: 26 mEq/L (ref 19–32)
Calcium: 9.4 mg/dL (ref 8.4–10.5)
Creatinine, Ser: 1.2 mg/dL (ref 0.50–1.35)
GFR calc Af Amer: 65 mL/min — ABNORMAL LOW (ref >90)
GFR calc non Af Amer: 56 mL/min — ABNORMAL LOW (ref >90)
Glucose, Bld: 95 mg/dL (ref 70–99)

## 2012-12-18 LAB — CBC WITH DIFFERENTIAL/PLATELET
Basophils Absolute: 0 10*3/uL (ref 0.0–0.1)
Basophils Relative: 0 % (ref 0–1)
Eosinophils Absolute: 0 10*3/uL (ref 0.0–0.7)
Eosinophils Relative: 1 % (ref 0–5)
Lymphocytes Relative: 33 % (ref 12–46)
Lymphs Abs: 0.3 10*3/uL — ABNORMAL LOW (ref 0.7–4.0)
MCH: 27.4 pg (ref 26.0–34.0)
MCHC: 34.4 g/dL (ref 30.0–36.0)
MCV: 79.5 fL (ref 78.0–100.0)
Monocytes Absolute: 0.4 10*3/uL (ref 0.1–1.0)
Platelets: 138 10*3/uL — ABNORMAL LOW (ref 150–400)
RBC: 3.07 MIL/uL — ABNORMAL LOW (ref 4.22–5.81)
RDW: 12.4 % (ref 11.5–15.5)

## 2012-12-18 LAB — URINALYSIS, ROUTINE W REFLEX MICROSCOPIC
Glucose, UA: NEGATIVE mg/dL
Hgb urine dipstick: NEGATIVE
Ketones, ur: NEGATIVE mg/dL
Leukocytes, UA: NEGATIVE
Protein, ur: NEGATIVE mg/dL
Specific Gravity, Urine: 1.022 (ref 1.005–1.030)
Urobilinogen, UA: 0.2 mg/dL (ref 0.0–1.0)

## 2012-12-18 MED ORDER — MIRTAZAPINE 7.5 MG PO TABS
7.5000 mg | ORAL_TABLET | Freq: Every day | ORAL | Status: DC
  Administered 2012-12-18 – 2012-12-20 (×3): 7.5 mg via ORAL
  Filled 2012-12-18 (×4): qty 1

## 2012-12-18 MED ORDER — HYDROCODONE-ACETAMINOPHEN 10-325 MG PO TABS
1.0000 | ORAL_TABLET | Freq: Four times a day (QID) | ORAL | Status: DC | PRN
  Filled 2012-12-18 (×2): qty 1

## 2012-12-18 MED ORDER — ACETAMINOPHEN 325 MG PO TABS: 650.0000 mg | ORAL_TABLET | Freq: Four times a day (QID) | ORAL | Status: DC | PRN

## 2012-12-18 MED ORDER — SODIUM CHLORIDE 0.9 % IV BOLUS (SEPSIS)
1000.0000 mL | Freq: Once | INTRAVENOUS | Status: AC
  Administered 2012-12-18: 1000 mL via INTRAVENOUS

## 2012-12-18 MED ORDER — SODIUM CHLORIDE 0.9 % IV SOLN
INTRAVENOUS | Status: AC
  Administered 2012-12-18: 16:00:00 via INTRAVENOUS
  Administered 2012-12-19: 1000 mL via INTRAVENOUS

## 2012-12-18 MED ORDER — ONDANSETRON HCL 4 MG/2ML IJ SOLN: 4.0000 mg | Freq: Four times a day (QID) | INTRAMUSCULAR | Status: DC | PRN

## 2012-12-18 MED ORDER — HYDROMORPHONE HCL PF 1 MG/ML IJ SOLN
0.5000 mg | INTRAMUSCULAR | Status: AC | PRN
  Administered 2012-12-18: 0.5 mg via INTRAVENOUS
  Filled 2012-12-18: qty 1

## 2012-12-18 MED ORDER — TAMSULOSIN HCL 0.4 MG PO CAPS
0.4000 mg | ORAL_CAPSULE | Freq: Every day | ORAL | Status: DC
  Administered 2012-12-18 – 2012-12-20 (×3): 0.4 mg via ORAL
  Filled 2012-12-18 (×4): qty 1

## 2012-12-18 MED ORDER — HYDROMORPHONE HCL PF 1 MG/ML IJ SOLN
1.0000 mg | Freq: Once | INTRAMUSCULAR | Status: AC
  Administered 2012-12-18: 1 mg via INTRAVENOUS
  Filled 2012-12-18: qty 1

## 2012-12-18 MED ORDER — PIPERACILLIN-TAZOBACTAM 3.375 G IVPB
3.3750 g | Freq: Three times a day (TID) | INTRAVENOUS | Status: DC
  Filled 2012-12-18: qty 50

## 2012-12-18 MED ORDER — FILGRASTIM 300 MCG/ML IJ SOLN
300.0000 ug | Freq: Once | INTRAMUSCULAR | Status: AC
  Administered 2012-12-18: 300 ug via SUBCUTANEOUS
  Filled 2012-12-18: qty 1

## 2012-12-18 MED ORDER — PANTOPRAZOLE SODIUM 40 MG PO TBEC
40.0000 mg | DELAYED_RELEASE_TABLET | Freq: Two times a day (BID) | ORAL | Status: DC
  Administered 2012-12-18 – 2012-12-21 (×6): 40 mg via ORAL
  Filled 2012-12-18 (×7): qty 1

## 2012-12-18 MED ORDER — VANCOMYCIN HCL IN DEXTROSE 750-5 MG/150ML-% IV SOLN
750.0000 mg | Freq: Two times a day (BID) | INTRAVENOUS | Status: DC
  Administered 2012-12-18 – 2012-12-20 (×4): 750 mg via INTRAVENOUS
  Filled 2012-12-18 (×6): qty 150

## 2012-12-18 MED ORDER — SODIUM CHLORIDE 0.9 % IV SOLN
INTRAVENOUS | Status: AC
  Administered 2012-12-18: 14:00:00 via INTRAVENOUS

## 2012-12-18 MED ORDER — PIPERACILLIN-TAZOBACTAM 3.375 G IVPB 30 MIN
3.3750 g | Freq: Once | INTRAVENOUS | Status: DC
  Filled 2012-12-18: qty 50

## 2012-12-18 MED ORDER — ONDANSETRON HCL 4 MG/2ML IJ SOLN: 4.0000 mg | Freq: Three times a day (TID) | INTRAMUSCULAR | Status: DC | PRN

## 2012-12-18 MED ORDER — ONDANSETRON HCL 4 MG/2ML IJ SOLN
4.0000 mg | Freq: Once | INTRAMUSCULAR | Status: AC
  Administered 2012-12-18: 4 mg via INTRAVENOUS
  Filled 2012-12-18: qty 2

## 2012-12-18 MED ORDER — ENOXAPARIN SODIUM 40 MG/0.4ML ~~LOC~~ SOLN
40.0000 mg | SUBCUTANEOUS | Status: DC
  Administered 2012-12-18 – 2012-12-20 (×3): 40 mg via SUBCUTANEOUS
  Filled 2012-12-18 (×4): qty 0.4

## 2012-12-18 MED ORDER — DEXTROSE 5 % IV SOLN
1.0000 g | Freq: Three times a day (TID) | INTRAVENOUS | Status: DC
  Administered 2012-12-18 – 2012-12-21 (×10): 1 g via INTRAVENOUS
  Filled 2012-12-18 (×11): qty 1

## 2012-12-18 NOTE — Telephone Encounter (Signed)
Pt's wife called this morning stating that pt is very dizzy, has orthostatic hypotension, not eating, drinking, and is coughing/vomiting yellow sputum.  No fever noted.  She states she feels like he has PNA and needs to be evaluated.  Per Dr Donnald Garre, pt needs to report to the ED.  SLJ

## 2012-12-18 NOTE — ED Notes (Signed)
Pt and family report dizziness that started today followed with one episode of emesis. Pt is receiving chemotherapy and sts has constant nausea but no vomiting. Pt has hx of lung cancer.

## 2012-12-18 NOTE — ED Notes (Signed)
Attempted to call report to floor, per secretary RN unavailable at this time, call back information provided.

## 2012-12-18 NOTE — Care Management Note (Signed)
   CARE MANAGEMENT NOTE 12/18/2012  Patient:  Justin Pittman, Justin Pittman   Account Number:  [id number]  Date Initiated:  12/18/2012  Documentation initiated by:  Amariz Flamenco  Subjective/Objective Assessment:   79 yo male admitted with CAP. PCP: Kimber Relic, MD     Action/Plan:   Home when stable   Anticipated DC Date:     Anticipated DC Plan:  HOME/SELF CARE      DC Planning Services  CM consult      Choice offered to / List presented to:  NA   DME arranged  NA      DME agency  NA     HH arranged  NA      HH agency  NA   Status of service:  In process, will continue to follow Medicare Important Message given?   (If response is "NO", the following Medicare IM given date fields will be blank) Date Medicare IM given:   Date Additional Medicare IM given:    Discharge Disposition:    Per UR Regulation:  Reviewed for med. necessity/level of care/duration of stay  If discussed at Long Length of Stay Meetings, dates discussed:    Comments:  12/18/12 1425 Justin Araki,RN,MSN [phone] Chart reviewed for utilization. PTA pt from home with family.

## 2012-12-18 NOTE — ED Notes (Signed)
Pt is cancer pt and been lethargic and weak for past couple of weeks. Pt started getting dizzy today.

## 2012-12-18 NOTE — Progress Notes (Signed)
ANTIBIOTIC CONSULT NOTE - INITIAL  Pharmacy Consult for Vancomycin & Cefepime Indication: R/O HCAP, neutropenic  No Known Allergies  Patient Measurements:  Total body weight: 63 kg  Vital Signs: Temp: 97.9 F (36.6 C) (10/22 1006) Temp src: Oral (10/22 1006) BP: 105/64 mmHg (10/22 1006) Pulse Rate: 95 (10/22 1006) Intake/Output from previous day:   Intake/Output from this shift:    Labs:  Recent Labs  12/18/12 1138  WBC 0.8*  HGB 8.4*  PLT 138*  CREATININE 1.20   The CrCl is unknown because both a height and weight (above a minimum accepted value) are required for this calculation. No results found for this basename: VANCOTROUGH, VANCOPEAK, VANCORANDOM, GENTTROUGH, GENTPEAK, GENTRANDOM, TOBRATROUGH, TOBRAPEAK, TOBRARND, AMIKACINPEAK, AMIKACINTROU, AMIKACIN,  in the last 72 hours   Microbiology: No results found for this or any previous visit (from the past 720 hour(s)).  Medical History: Past Medical History  Diagnosis Date  . HTN (hypertension)   . Lung cancer     Squamous Cell  . Vertigo 2010    Hospitalized a [facility]  . Hyperlipidemia   . Lung cancer   . Loss of weight   . Pyoderma 03/12/2012  . Contact dermatitis and other eczema due to other specified agent   . Tachycardia, unspecified 01/17/2012  . Candidiasis of mouth 01/01/2012  . Insomnia, unspecified 01/01/2012  . Malignant neoplasm of bronchus and lung, unspecified site 12/29/2011  . Hypopotassemia 12/29/2011  . Other pancytopenia 12/29/2011  . Anemia of other chronic disease 12/29/2011  . Pneumonitis due to inhalation of food or vomitus 12/29/2011  . Other esophagitis 12/29/2011   Medications:  Anti-infectives   Start     Dose/Rate Route Frequency Ordered Stop   12/18/12 2200  piperacillin-tazobactam (ZOSYN) IVPB 3.375 g  Status:  Discontinued     3.375 g 12.5 mL/hr over 240 Minutes Intravenous 3 times per day 12/18/12 1403 12/18/12 1443   12/18/12 1600  vancomycin (VANCOCIN) IVPB 750 mg/150  ml premix     750 mg 150 mL/hr over 60 Minutes Intravenous Every 12 hours 12/18/12 1402     12/18/12 1500  ceFEPIme (MAXIPIME) 1 g in dextrose 5 % 50 mL IVPB     1 g 100 mL/hr over 30 Minutes Intravenous 3 times per day 12/18/12 1455     12/18/12 1415  piperacillin-tazobactam (ZOSYN) IVPB 3.375 g  Status:  Discontinued     3.375 g 100 mL/hr over 30 Minutes Intravenous  Once 12/18/12 1401 12/18/12 1455     Assessment: 80 yoM admitted from ED; rule out HCAP. Hx of NSC Lung Ca, 7 day hx of weakness, dizziness. Productive cough, vomited phlegm. Blood cx ordered, Vancomycin & Zosyn ordered by ED physician, then Vancomycin & Cefepime ordered by hospitalist. Discontinue Zosyn.  Chemo for NSC Lung Ca: Carbo d1/Gemzaar d1,8: Cycle 1 completed 10/8, was scheduled for Cycle 2 today.  WBC 0.8, afebrile  Goal of Therapy:  Vancomycin trough level 15-20 mcg/ml  Plan:   Cefepime 1gm q8hr  Vancomycin 750mg  q12  Monitor cultures, clinical course.   Otho Bellows PharmD Pager [phone] 12/18/2012, 3:15 PM

## 2012-12-18 NOTE — ED Notes (Signed)
Critical lab value WBC 0.8.

## 2012-12-18 NOTE — ED Provider Notes (Signed)
CSN: [id number]     Arrival date & time 12/18/12  1000 History   First MD Initiated Contact with Patient 12/18/12 1036     Chief Complaint  Patient presents with  . Dizziness  . Wheezing  . Nausea  . Emesis   (Consider location/radiation/quality/duration/timing/severity/associated sxs/prior Treatment) HPI Comments: 79 yo male with htn, lung CA, radiation hx and recently resumed chemo and was due for third dose however felt vomiting/ nausea since this am, not tolerating po.  Pain worse with palpation lower right ribs, similar to previous CA pain.  Constant, ache.  No known GB issues.  No fevers.  Pt on cipro for URI.    Patient is a 79 y.o. male presenting with wheezing and vomiting. The history is provided by the patient.  Wheezing Associated symptoms: cough   Associated symptoms: no chest pain, no fever, no headaches, no rash and no shortness of breath   Emesis Associated symptoms: abdominal pain (right upper - mostly ribs) and arthralgias   Associated symptoms: no chills and no headaches     Past Medical History  Diagnosis Date  . HTN (hypertension)   . Lung cancer     Squamous Cell  . Vertigo 2010    Hospitalized a [facility]  . Hyperlipidemia   . Lung cancer   . Loss of weight   . Pyoderma 03/12/2012  . Contact dermatitis and other eczema due to other specified agent   . Tachycardia, unspecified 01/17/2012  . Candidiasis of mouth 01/01/2012  . Insomnia, unspecified 01/01/2012  . Malignant neoplasm of bronchus and lung, unspecified site 12/29/2011  . Hypopotassemia 12/29/2011  . Other pancytopenia 12/29/2011  . Anemia of other chronic disease 12/29/2011  . Pneumonitis due to inhalation of food or vomitus 12/29/2011  . Other esophagitis 12/29/2011   Past Surgical History  Procedure Laterality Date  . Tonsillectomy    . Video bronchoscopy  09/20/2011    Procedure: VIDEO BRONCHOSCOPY WITHOUT FLUORO;  Surgeon: Nyoka Cowden, MD;  Location: [facility];  Service:  Cardiopulmonary;  Laterality: Bilateral;  . Tonsillectomy     Family History  Problem Relation Age of Onset  . Cancer Brother     Bone   History  Substance Use Topics  . Smoking status: Former Smoker -- 1.00 packs/day for 40 years    Types: Cigarettes    Quit date: 02/27/1993  . Smokeless tobacco: Never Used  . Alcohol Use: No    Review of Systems  Constitutional: Positive for appetite change. Negative for fever and chills.  HENT: Negative for congestion.   Eyes: Negative for visual disturbance.  Respiratory: Positive for cough and wheezing. Negative for shortness of breath.   Cardiovascular: Negative for chest pain.  Gastrointestinal: Positive for nausea, vomiting and abdominal pain (right upper - mostly ribs).  Genitourinary: Negative for dysuria and flank pain.  Musculoskeletal: Positive for arthralgias. Negative for back pain, neck pain and neck stiffness.  Skin: Negative for rash.  Neurological: Positive for weakness and light-headedness. Negative for syncope and headaches.    Allergies  Review of patient's allergies indicates no known allergies.  Home Medications   Current Outpatient Rx  Name  Route  Sig  Dispense  Refill  . ciprofloxacin (CIPRO) 500 MG tablet   Oral   Take 1 tablet (500 mg total) by mouth 2 (two) times daily.   20 tablet   0   . ENSURE (ENSURE)   Oral   Take 237 mLs by mouth.         Marland Kitchen  HYDROcodone-acetaminophen (NORCO) 10-325 MG per tablet      One tablet every 6 hours if needed for pain   120 tablet   0   . mirtazapine (REMERON) 7.5 MG tablet      Take two tablets by mouth at bedtime   60 tablet   0   . pantoprazole (PROTONIX) 40 MG tablet   Oral   Take 40 mg by mouth 2 (two) times daily. To  reduce stomach acid         . prochlorperazine (COMPAZINE) 10 MG tablet      TAKE ONE TABLET BY MOUTH EVERY 6 HOURS AS NEEDED   30 tablet   0   . tamsulosin (FLOMAX) 0.4 MG CAPS      TAKE ONE CAPSULE BY MOUTH ONCE DAILY FOR  PROSTATE   30 capsule   3    BP 105/64  Pulse 95  Temp(Src) 97.9 F (36.6 C) (Oral)  Resp 18  SpO2 97% Physical Exam  Nursing note and vitals reviewed. Constitutional: He is oriented to person, place, and time. He appears well-developed and well-nourished.  HENT:  Head: Normocephalic and atraumatic.  Dry mm  Eyes: Conjunctivae are normal. Right eye exhibits no discharge. Left eye exhibits no discharge.  Neck: Normal range of motion. Neck supple. No tracheal deviation present.  Cardiovascular: Normal rate and regular rhythm.   Pulmonary/Chest: Effort normal. He has rales (right mid/ lower).  Abdominal: Soft. He exhibits no distension. There is tenderness (mild ruq, mostly ribs). There is no guarding.  Musculoskeletal: He exhibits tenderness (right lower ribs). He exhibits no edema.  Neurological: He is alert and oriented to person, place, and time. No cranial nerve deficit. GCS eye subscore is 4. GCS verbal subscore is 5. GCS motor subscore is 6.  General weakness, moves all ext equal Finger nose intact  Skin: Skin is warm. No rash noted.  Psychiatric: He has a normal mood and affect.    ED Course  Procedures (including critical care time) Labs Review Labs Reviewed  CBC WITH DIFFERENTIAL - Abnormal; Notable for the following:    WBC 0.8 (*)    RBC 3.07 (*)    Hemoglobin 8.4 (*)    HCT 24.4 (*)    Platelets 138 (*)    Neutrophils Relative % 16 (*)    Monocytes Relative 50 (*)    Neutro Abs 0.1 (*)    Lymphs Abs 0.3 (*)    All other components within normal limits  COMPREHENSIVE METABOLIC PANEL - Abnormal; Notable for the following:    Sodium 132 (*)    Albumin 3.4 (*)    Alkaline Phosphatase 142 (*)    GFR calc non Af Amer 56 (*)    GFR calc Af Amer 65 (*)    All other components within normal limits  URINALYSIS, ROUTINE W REFLEX MICROSCOPIC - Abnormal; Notable for the following:    APPearance CLOUDY (*)    All other components within normal limits  LIPASE, BLOOD    Imaging Review Dg Chest 2 View  12/18/2012   CLINICAL DATA:  History of lung carcinoma, weakness  EXAM: CHEST  2 VIEW  COMPARISON:  05/28/2012  FINDINGS: Cardiac shadow is stable. The left lung is well aerated. There is volume loss in the right lung consistent with the patient's known history an likely post obstructive change. No other focal abnormality is noted.  IMPRESSION: Increasing right hilar disease with post obstructive changes in the medial aspect of the right lower lobe.  Electronically Signed   By: Alcide Clever M.D.   On: 12/18/2012 12:49    EKG Interpretation   None       MDM  No diagnosis found. Clinically n/v from pain and known CA.  Will check labs, fluids, pain and antiemetics. CXR no acute findings, reviewed.   Rechecked, pt still weak, dehydrated. Neutropenic precautions discussed with nursing and family/ pt.  Fluids given, pain improved. Discussed with Dr Gwenyth Bouillon and hospitalist, plan for admission, hold on abx at this time.  UA pending.  The patients results and plan were reviewed and discussed.   Any x-rays performed were personally reviewed by myself.   Differential diagnosis were considered with the presenting HPI.  Diagnosis: Lung CA, Vomiting, Dehydration, Pancytopenia, Neutropenia, Rib pain  Admission/ observation were discussed with the admitting physician, patient and/or family and they are comfortable with the plan.    Enid Skeens, MD 12/18/12 1316

## 2012-12-18 NOTE — H&P (Addendum)
[facility] History and Physical  Justin Pittman MVH:[id number] DOB: 1932/11/02 DOA: 12/18/2012  Referring physician: EDP PCP: Kimber Relic, MD  Specialists: Onc Dr.Mohamed  Chief Complaint: weakness, lethargy  HPI: Justin Pittman is a 79 y.o. male with PMH of  Recurrent Non small Cell Lung CA currently on Palliative chemo is brought to the ER by his family today. His last chemo was 10/8 and was due to have the next cycle today, for past 2 weeks, but increasingly since Friday he has been increasingly weak and dizzy with very poor appetite and no energy. No fevers, some chills or rigors noted per wife over weekend. Today coughed up a lot of thick yellow phlegm and then vomited up a lot of swallowed phlegm, he was too weak to bring to the [facility] and hence was brought to the ER. Last Monday he was given IVF at the [facility] due to weakness, poor Po intake. In ER, Vitals stable but Neutropenic and Na 132, CXr with possible infiltrate  Review of Systems: The patient denies anorexia, fever, weight loss,, vision loss, decreased hearing, hoarseness, chest pain, syncope, dyspnea on exertion, peripheral edema, balance deficits, hemoptysis, abdominal pain, melena, hematochezia, severe indigestion/heartburn, hematuria, incontinence, genital sores, muscle weakness, suspicious skin lesions, transient blindness, difficulty walking, depression, unusual weight change, abnormal bleeding, enlarged lymph nodes, angioedema, and breast masses.    Past Medical History  Diagnosis Date  . HTN (hypertension)   . Lung cancer     Squamous Cell  . Vertigo 2010    Hospitalized a [facility]  . Hyperlipidemia   . Lung cancer   . Loss of weight   . Pyoderma 03/12/2012  . Contact dermatitis and other eczema due to other specified agent   . Tachycardia, unspecified 01/17/2012  . Candidiasis of mouth 01/01/2012  . Insomnia, unspecified 01/01/2012  . Malignant neoplasm of bronchus and lung, unspecified  site 12/29/2011  . Hypopotassemia 12/29/2011  . Other pancytopenia 12/29/2011  . Anemia of other chronic disease 12/29/2011  . Pneumonitis due to inhalation of food or vomitus 12/29/2011  . Other esophagitis 12/29/2011   Past Surgical History  Procedure Laterality Date  . Tonsillectomy    . Video bronchoscopy  09/20/2011    Procedure: VIDEO BRONCHOSCOPY WITHOUT FLUORO;  Surgeon: Nyoka Cowden, MD;  Location: [facility];  Service: Cardiopulmonary;  Laterality: Bilateral;  . Tonsillectomy     Social History:  reports that he quit smoking about 19 years ago. His smoking use included Cigarettes. He has a 40 pack-year smoking history. He has never used smokeless tobacco. He reports that he does not drink alcohol or use illicit drugs.   No Known Allergies  Family History  Problem Relation Age of Onset  . Cancer Brother     Bone    Prior to Admission medications   Medication Sig Start Date End Date Taking? Authorizing Provider  ciprofloxacin (CIPRO) 500 MG tablet Take 500 mg by mouth 2 (two) times daily. 12/13/12  Yes Historical Provider, MD  ENSURE (ENSURE) Take 237 mLs by mouth daily.    Yes Historical Provider, MD  HYDROcodone-acetaminophen (NORCO) 10-325 MG per tablet Take 1 tablet by mouth every 6 (six) hours as needed for pain.   Yes Historical Provider, MD  mirtazapine (REMERON) 7.5 MG tablet Take 7.5 mg by mouth at bedtime.   Yes Historical Provider, MD  pantoprazole (PROTONIX) 40 MG tablet Take 40 mg by mouth 2 (two) times daily. To  reduce stomach acid 09/18/12  Yes Merton Border  Hale Drone, MD  prochlorperazine (COMPAZINE) 10 MG tablet Take 10 mg by mouth every 6 (six) hours as needed.   Yes Historical Provider, MD  tamsulosin (FLOMAX) 0.4 MG CAPS capsule Take 0.4 mg by mouth daily after supper.   Yes Historical Provider, MD   Physical Exam: Filed Vitals:   12/18/12 1006  BP: 105/64  Pulse: 95  Temp: 97.9 F (36.6 C)  Resp: 18     General:  Frail, elderly male, lethargic,  oriented x3  HEENT: PERRLA, EOMI, oral mucosa dry  Cardiovascular: S1S2/RRR  Respiratory: Diminished BS, scattered basilar ronchi  Abdomen: soft, scaphoid, NT< ND, BS present  Skin: no rashes  Musculoskeletal: no edema c/c  Psychiatric: appropriate mood and affect  Neurologic: non focal, generalized weakness  Labs on Admission:  Basic Metabolic Panel:  Recent Labs Lab 12/18/12 1138  NA 132*  K 3.9  CL 97  CO2 26  GLUCOSE 95  BUN 16  CREATININE 1.20  CALCIUM 9.4   Liver Function Tests:  Recent Labs Lab 12/18/12 1138  AST 21  ALT 14  ALKPHOS 142*  BILITOT 0.3  PROT 6.9  ALBUMIN 3.4*    Recent Labs Lab 12/18/12 1138  LIPASE 19   No results found for this basename: AMMONIA,  in the last 168 hours CBC:  Recent Labs Lab 12/18/12 1138  WBC 0.8*  NEUTROABS 0.1*  HGB 8.4*  HCT 24.4*  MCV 79.5  PLT 138*   Cardiac Enzymes: No results found for this basename: CKTOTAL, CKMB, CKMBINDEX, TROPONINI,  in the last 168 hours  BNP (last 3 results) No results found for this basename: PROBNP,  in the last 8760 hours CBG: No results found for this basename: GLUCAP,  in the last 168 hours  Radiological Exams on Admission: Dg Chest 2 View  12/18/2012   CLINICAL DATA:  History of lung carcinoma, weakness  EXAM: CHEST  2 VIEW  COMPARISON:  05/28/2012  FINDINGS: Cardiac shadow is stable. The left lung is well aerated. There is volume loss in the right lung consistent with the patient's known history an likely post obstructive change. No other focal abnormality is noted.  IMPRESSION: Increasing right hilar disease with post obstructive changes in the medial aspect of the right lower lobe.   Electronically Signed   By: Alcide Clever M.D.   On: 12/18/2012 12:49    Assessment/Plan  1. HCAP-suspected based on CXR and symptoms -immunocompromised /ongoing chemotherapy -start Empiric IV Vanc and Zosyn -Blood and sputum CX -urine legionella and pneumococcal  Ag -de-escalate Abx based on Cx  2. Recurrent non small Cell Lung CA -followed by dr.Mohamed, notified of admission per EDP -given ongoing failure to thrive need discussion regarding goals and prognosis, pt and family want to d/w dr.Mohamed  3. Hyponatremia/Dehydration -from chemo/anorexia -hydrate   4. Neutropenia -from chemo -Neupogen SQ today  5. Severe protein calorie malnutrition -nutrition consult  DVT proph: lovenox  Code Status: Full Code, do not want prolong life support Family Communication: d/w son and wife at bedside Disposition Plan:inpatient  Time spent:  [facility] Pager [phone]  If 7PM-7AM, please contact night-coverage [email or web address] Password [email or web address] 12/18/2012, 1:50 PM

## 2012-12-19 LAB — COMPREHENSIVE METABOLIC PANEL
AST: 15 U/L (ref 0–37)
Albumin: 2.9 g/dL — ABNORMAL LOW (ref 3.5–5.2)
BUN: 12 mg/dL (ref 6–23)
CO2: 25 mEq/L (ref 19–32)
Calcium: 8.7 mg/dL (ref 8.4–10.5)
Chloride: 103 mEq/L (ref 96–112)
Creatinine, Ser: 1.16 mg/dL (ref 0.50–1.35)
GFR calc Af Amer: 68 mL/min — ABNORMAL LOW (ref >90)
GFR calc non Af Amer: 58 mL/min — ABNORMAL LOW (ref >90)
Glucose, Bld: 94 mg/dL (ref 70–99)
Total Protein: 5.9 g/dL — ABNORMAL LOW (ref 6.0–8.3)

## 2012-12-19 LAB — CBC
Hemoglobin: 8 g/dL — ABNORMAL LOW (ref 13.0–17.0)
MCH: 27.7 pg (ref 26.0–34.0)
MCHC: 34.8 g/dL (ref 30.0–36.0)
MCV: 79.6 fL (ref 78.0–100.0)
Platelets: 139 10*3/uL — ABNORMAL LOW (ref 150–400)
RBC: 2.89 MIL/uL — ABNORMAL LOW (ref 4.22–5.81)
WBC: 1.2 10*3/uL — CL (ref 4.0–10.5)

## 2012-12-19 LAB — LEGIONELLA ANTIGEN, URINE: Legionella Antigen, Urine: NEGATIVE

## 2012-12-19 MED ORDER — HYDROMORPHONE HCL PF 1 MG/ML IJ SOLN
0.5000 mg | INTRAMUSCULAR | Status: DC | PRN
  Administered 2012-12-19: 0.5 mg via INTRAVENOUS
  Administered 2012-12-19: 13:00:00 via INTRAVENOUS
  Administered 2012-12-20: 0.5 mg via INTRAVENOUS
  Filled 2012-12-19 (×3): qty 1

## 2012-12-19 MED ORDER — ENSURE COMPLETE PO LIQD
237.0000 mL | Freq: Two times a day (BID) | ORAL | Status: DC
  Administered 2012-12-21 (×2): 237 mL via ORAL

## 2012-12-19 MED ORDER — SODIUM CHLORIDE 0.9 % IV SOLN
INTRAVENOUS | Status: DC
  Administered 2012-12-20: 1000 mL via INTRAVENOUS

## 2012-12-19 NOTE — Progress Notes (Signed)
INITIAL NUTRITION ASSESSMENT  DOCUMENTATION CODES Per approved criteria  -Severe malnutrition in the context of chronic illness  Pt meets criteria for severe MALNUTRITION in the context of chronic illness as evidenced by estimated intake <75% of estimated needs for >1 month and weight loss of 7% in <1 month.  INTERVENTION: Ensure Complete po BID, each supplement provides 350 kcal and 13 grams of protein.  NUTRITION DIAGNOSIS: Inadequate oral intake related to cancer and pneumonia as evidenced by 10 lb wt loss in 3-4 weeks.   Goal: Pt to meet >/= 90% of their estimated nutrition needs   Monitor:  Acceptance of supplements Weight Labs  Reason for Assessment: MST  79 y.o. male  Admitting Dx: Healthcare-associated pneumonia  ASSESSMENT: 79 y.o. male with PMH of recurrent Non small Cell Lung CA currently on Palliative chemo is brought to the ER by his family today (10/22). His last chemo was 10/8 and was due to have the next cycle today (10/22), for past 2 weeks, but increasingly since Friday he has been increasingly weak and dizzy with very poor appetite and no energy.  Pt reports that he has had a poor appetite and has lost around 10 lbs over the past month. He says that his appetite has been improved today, and that he is ready for dinner. Pt reports drinking ensure once daily. He was reluctant to accept Ensure while in the hospital, but accepted upon encouragement.    Height: Ht Readings from Last 1 Encounters:  12/18/12 5\' 10"  (1.778 m)    Weight: Wt Readings from Last 1 Encounters:  12/18/12 138 lb 9.6 oz (62.869 kg)    Ideal Body Weight: 73 kg  % Ideal Body Weight: 86%  Wt Readings from Last 10 Encounters:  12/18/12 138 lb 9.6 oz (62.869 kg)  12/04/12 138 lb 6.4 oz (62.778 kg)  11/19/12 146 lb 1.6 oz (66.271 kg)  11/05/12 147 lb 4.8 oz (66.815 kg)  10/29/12 147 lb (66.679 kg)  10/22/12 147 lb (66.679 kg)  09/18/12 148 lb 9.6 oz (67.405 kg)  08/05/12 143 lb 1.6  oz (64.91 kg)  06/12/12 132 lb (59.875 kg)  05/28/12 121 lb (54.885 kg)    Usual Body Weight: 148 lbs  % Usual Body Weight: 93%  BMI:  Body mass index is 19.89 kg/(m^2).  Estimated Nutritional Needs: Kcal: 1600-1900 Protein: 75-90 g Fluid: >1.6 L  Skin: WNL  Diet Order: General  EDUCATION NEEDS: -No education needs identified at this time   Intake/Output Summary (Last 24 hours) at 12/19/12 1505 Last data filed at 12/19/12 1135  Gross per 24 hour  Intake   1922 ml  Output   2300 ml  Net   -378 ml    Last BM: none recorded   Labs:   Recent Labs Lab 12/18/12 1138 12/19/12 0410  NA 132* 136  K 3.9 4.0  CL 97 103  CO2 26 25  BUN 16 12  CREATININE 1.20 1.16  CALCIUM 9.4 8.7  GLUCOSE 95 94    CBG (last 3)  No results found for this basename: GLUCAP,  in the last 72 hours  Scheduled Meds: . ceFEPime (MAXIPIME) IV  1 g Intravenous Q8H  . enoxaparin (LOVENOX) injection  40 mg Subcutaneous Q24H  . mirtazapine  7.5 mg Oral QHS  . pantoprazole  40 mg Oral BID  . tamsulosin  0.4 mg Oral QPC supper  . vancomycin  750 mg Intravenous Q12H    Continuous Infusions: . sodium chloride 75 mL/hr  at 12/19/12 1135    Past Medical History  Diagnosis Date  . HTN (hypertension)   . Lung cancer     Squamous Cell  . Vertigo 2010    Hospitalized a [facility]  . Hyperlipidemia   . Lung cancer   . Loss of weight   . Pyoderma 03/12/2012  . Contact dermatitis and other eczema due to other specified agent   . Tachycardia, unspecified 01/17/2012  . Candidiasis of mouth 01/01/2012  . Insomnia, unspecified 01/01/2012  . Malignant neoplasm of bronchus and lung, unspecified site 12/29/2011  . Hypopotassemia 12/29/2011  . Other pancytopenia 12/29/2011  . Anemia of other chronic disease 12/29/2011  . Pneumonitis due to inhalation of food or vomitus 12/29/2011  . Other esophagitis 12/29/2011    Past Surgical History  Procedure Laterality Date  . Tonsillectomy    . Video  bronchoscopy  09/20/2011    Procedure: VIDEO BRONCHOSCOPY WITHOUT FLUORO;  Surgeon: Nyoka Cowden, MD;  Location: [facility];  Service: Cardiopulmonary;  Laterality: Bilateral;  . Tonsillectomy      Ebbie Latus RD, LDN

## 2012-12-19 NOTE — Progress Notes (Addendum)
[facility] PROGRESS NOTE  Justin Pittman KGM:[id number] DOB: [birth year] DOA: 12/18/2012 PCP: Kimber Relic, MD Oncologist: Dr. Si Gaul  Brief narrative: Justin Pittman is an 79 y.o. male with a PMH of non-small cell lung cancer, receiving palliative chemotherapy by Dr. Arbutus Ped, whose last chemotherapy was given 12/04/2012, who was admitted 12/18/2012 with healthcare associated pneumonia in the setting of neutropenia.  Assessment/Plan: Principal Problem:   Healthcare-associated pneumonia in the setting of neutropenia -Admitted and placed on empiric IV vancomycin and Zosyn. -Followup blood and sputum cultures. -Strep pneumonia antigen negative. Legionella antigen pending. -Neupogen started to address neutropenia. Active Problems:   Non-small cell lung cancer -Dr. Arbutus Ped notified of the patient's admission.   Dehydration / hyponatremia -Hydrate and monitor sodium closely.   Adult failure to thrive / severe protein calorie malnutrition  -Secondary to advanced malignancy. -Dietitian consultation requested. -PT evaluations when stable.  Code Status: Full. Family Communication: No family at the bedside. Disposition Plan: Home when stable.   Medical Consultants:  None.  Other Consultants:  Dietitian.  Anti-infectives:  Vancomycin 12/18/2012--->  Cefepime 12/18/2012--->  HPI/Subjective: Justin Pittman feels poorly, but nonspecifically so. Denies dyspnea. Occasional cough. No nausea or vomiting. No pain. Appetite is poor with some lack of energy. No diarrhea.  Objective: Filed Vitals:   12/18/12 1425 12/18/12 2020 12/18/12 2219 12/19/12 0522  BP: 117/60  132/60 137/61  Pulse: 90 98 84 92  Temp: 97.6 F (36.4 C)  97.8 F (36.6 C) 98.3 F (36.8 C)  TempSrc: Oral  Oral Oral  Resp: 18  16 16   Height: 5\' 10"  (1.778 m)     Weight: 62.869 kg (138 lb 9.6 oz)     SpO2: 98%  97% 95%    Intake/Output Summary (Last 24 hours) at 12/19/12 0742 Last data  filed at 12/19/12 6440  Gross per 24 hour  Intake   1922 ml  Output   1750 ml  Net    172 ml    Exam: Gen:  NAD Cardiovascular:  RRR, No M/R/G Respiratory:  Lungs diminished on the right. Gastrointestinal:  Abdomen soft, NT/ND, + BS Extremities:  No C/E/C  Data Reviewed: Basic Metabolic Panel:  Recent Labs Lab 12/18/12 1138 12/19/12 0410  NA 132* 136  K 3.9 4.0  CL 97 103  CO2 26 25  GLUCOSE 95 94  BUN 16 12  CREATININE 1.20 1.16  CALCIUM 9.4 8.7   GFR Estimated Creatinine Clearance: 46.7 ml/min (by C-G formula based on Cr of 1.16). Liver Function Tests:  Recent Labs Lab 12/18/12 1138 12/19/12 0410  AST 21 15  ALT 14 11  ALKPHOS 142* 125*  BILITOT 0.3 0.4  PROT 6.9 5.9*  ALBUMIN 3.4* 2.9*    Recent Labs Lab 12/18/12 1138  LIPASE 19   CBC:  Recent Labs Lab 12/18/12 1138 12/19/12 0410  WBC 0.8* 1.2*  NEUTROABS 0.1*  --   HGB 8.4* 8.0*  HCT 24.4* 23.0*  MCV 79.5 79.6  PLT 138* 139*   Microbiology No results found for this or any previous visit (from the past 240 hour(s)).   Procedures and Diagnostic Studies: Dg Chest 2 View  12/18/2012   CLINICAL DATA:  History of lung carcinoma, weakness  EXAM: CHEST  2 VIEW  COMPARISON:  05/28/2012  FINDINGS: Cardiac shadow is stable. The left lung is well aerated. There is volume loss in the right lung consistent with the patient's known history an likely post obstructive change. No other focal abnormality is noted.  IMPRESSION: Increasing  right hilar disease with post obstructive changes in the medial aspect of the right lower lobe.   Electronically Signed   By: Alcide Clever M.D.   On: 12/18/2012 12:49    Scheduled Meds: . ceFEPime (MAXIPIME) IV  1 g Intravenous Q8H  . enoxaparin (LOVENOX) injection  40 mg Subcutaneous Q24H  . mirtazapine  7.5 mg Oral QHS  . pantoprazole  40 mg Oral BID  . tamsulosin  0.4 mg Oral QPC supper  . vancomycin  750 mg Intravenous Q12H   Continuous Infusions:   Time spent:  35 minutes with > 50% of time discussing current diagnostic test results, clinical impression and plan of care.   LOS: 1 day   Justin Pittman  [facility] Pager [phone].   *Please note that the hospitalists switch teams on Wednesdays. Please call the flow manager at [phone] if you are having difficulty reaching the hospitalist taking care of this patient as she can update you and provide the most up-to-date pager number of provider caring for the patient. If 8PM-8AM, please contact night-coverage at [email or web address], password [facility]  12/19/2012, 7:42 AM      In an effort to keep you and your family informed about your hospital stay, I am providing you with this information sheet. If you or your family have any questions, please do not hesitate to have the nursing staff page me to set up a meeting time.  Justin Pittman 12/19/2012 1 (Number of days in the hospital)  Treatment team:  Dr. Hillery Aldo, Hospitalist (Internist)  Dr. Si Gaul, Oncologist  Active Treatment Issues with Plan: Principal Problem:   Healthcare-associated pneumonia in the setting of low neutrophil count (cells that fight infection) -Being treated with potent antibiotics: vancomycin and cefepime. -Your blood and sputum cultures are pending (takes 3 days to get these back typically). -Neupogen started to address help your counts recover. Active Problems:   Non-small cell lung cancer -Dr. Arbutus Ped notified of your admission to the hospital.   Dehydration / low sodium -IV fluids have been ordered.   Malnutrition. -Dietitian consultation requested. -Physical therapy evaluation when stable.  Significant Lab results:  Recent Labs Lab 12/18/12 1138 12/19/12 0410  WBC 0.8* 1.2*  NEUTROABS 0.1*  --   HGB 8.4* 8.0*  HCT 24.4* 23.0*  MCV 79.5 79.6  PLT 138* 139*   Significant diagnostic study results: -Chest x-ray 12/18/2012: right lower lobe pneumonia  Anticipated discharge date: 2-3  days.

## 2012-12-20 DIAGNOSIS — E43 Unspecified severe protein-calorie malnutrition: Secondary | ICD-10-CM | POA: Insufficient documentation

## 2012-12-20 LAB — CBC WITH DIFFERENTIAL/PLATELET
Basophils Relative: 0 % (ref 0–1)
Eosinophils Absolute: 0 10*3/uL (ref 0.0–0.7)
Eosinophils Relative: 1 % (ref 0–5)
HCT: 24.1 % — ABNORMAL LOW (ref 39.0–52.0)
Hemoglobin: 8.3 g/dL — ABNORMAL LOW (ref 13.0–17.0)
Lymphocytes Relative: 17 % (ref 12–46)
Lymphs Abs: 0.5 10*3/uL — ABNORMAL LOW (ref 0.7–4.0)
MCHC: 34.4 g/dL (ref 30.0–36.0)
MCV: 80.9 fL (ref 78.0–100.0)
Monocytes Relative: 39 % — ABNORMAL HIGH (ref 3–12)
Neutro Abs: 1.1 10*3/uL — ABNORMAL LOW (ref 1.7–7.7)
Neutrophils Relative %: 43 % (ref 43–77)
RBC: 2.98 MIL/uL — ABNORMAL LOW (ref 4.22–5.81)

## 2012-12-20 LAB — VANCOMYCIN, TROUGH: Vancomycin Tr: 11.9 ug/mL (ref 10.0–20.0)

## 2012-12-20 MED ORDER — VANCOMYCIN HCL IN DEXTROSE 1-5 GM/200ML-% IV SOLN
1000.0000 mg | Freq: Two times a day (BID) | INTRAVENOUS | Status: DC
  Administered 2012-12-21: 1000 mg via INTRAVENOUS
  Filled 2012-12-20 (×2): qty 200

## 2012-12-20 MED ORDER — VANCOMYCIN HCL IN DEXTROSE 1-5 GM/200ML-% IV SOLN
1000.0000 mg | Freq: Once | INTRAVENOUS | Status: AC
  Administered 2012-12-20: 1000 mg via INTRAVENOUS
  Filled 2012-12-20: qty 200

## 2012-12-20 NOTE — Progress Notes (Signed)
ANTIBIOTIC CONSULT NOTE - FOLLOW UP  Pharmacy Consult for vancomycin Indication: R/O HCAP, neutropenic  No Known Allergies  Patient Measurements: Height: 5\' 10"  (177.8 cm) Weight: 138 lb 9.6 oz (62.869 kg) IBW/kg (Calculated) : 73 Adjusted Body Weight:   Vital Signs: Temp: 98.3 F (36.8 C) (10/24 1419) Temp src: Oral (10/24 1419) BP: 148/68 mmHg (10/24 1419) Pulse Rate: 84 (10/24 1419) Intake/Output from previous day: 10/23 0701 - 10/24 0700 In: 2238.8 [P.O.:30; I.V.:2008.8; IV Piggyback:200] Out: 1900 [Urine:1900] Intake/Output from this shift: Total I/O In: 480 [P.O.:480] Out: 1250 [Urine:1250]  Labs:  Recent Labs  12/18/12 1138 12/19/12 0410 12/20/12 0350  WBC 0.8* 1.2* 2.7*  HGB 8.4* 8.0* 8.3*  PLT 138* 139* 170  CREATININE 1.20 1.16  --    Estimated Creatinine Clearance: 46.7 ml/min (by C-G formula based on Cr of 1.16).  Recent Labs  12/20/12 1650  VANCOTROUGH 11.9     Microbiology: Recent Results (from the past 720 hour(s))  CULTURE, BLOOD (ROUTINE X 2)     Status: None   Collection Time    12/18/12  2:40 PM      Result Value Range Status   Specimen Description BLOOD RIGHT ARM   Final   Special Requests BOTTLES DRAWN AEROBIC AND ANAEROBIC 5CC   Final   Culture  Setup Time     Final   Value: 12/18/2012 22:58     Performed at [facility]   Culture     Final   Value:        BLOOD CULTURE RECEIVED NO GROWTH TO DATE CULTURE WILL BE HELD FOR 5 DAYS BEFORE ISSUING A FINAL NEGATIVE REPORT     Performed at [facility]   Report Status PENDING   Incomplete  CULTURE, BLOOD (ROUTINE X 2)     Status: None   Collection Time    12/18/12  2:47 PM      Result Value Range Status   Specimen Description BLOOD LEFT HAND   Final   Special Requests BOTTLES DRAWN AEROBIC ONLY 3CC   Final   Culture  Setup Time     Final   Value: 12/18/2012 22:58     Performed at [facility]   Culture     Final   Value:        BLOOD CULTURE RECEIVED NO  GROWTH TO DATE CULTURE WILL BE HELD FOR 5 DAYS BEFORE ISSUING A FINAL NEGATIVE REPORT     Performed at [facility]   Report Status PENDING   Incomplete    Anti-infectives   Start     Dose/Rate Route Frequency Ordered Stop   12/21/12 0600  vancomycin (VANCOCIN) IVPB 1000 mg/200 mL premix     1,000 mg 200 mL/hr over 60 Minutes Intravenous Every 12 hours 12/20/12 1859     12/20/12 1815  vancomycin (VANCOCIN) IVPB 1000 mg/200 mL premix     1,000 mg 200 mL/hr over 60 Minutes Intravenous  Once 12/20/12 1800     12/18/12 2200  piperacillin-tazobactam (ZOSYN) IVPB 3.375 g  Status:  Discontinued     3.375 g 12.5 mL/hr over 240 Minutes Intravenous 3 times per day 12/18/12 1403 12/18/12 1443   12/18/12 1600  vancomycin (VANCOCIN) IVPB 750 mg/150 ml premix  Status:  Discontinued     750 mg 150 mL/hr over 60 Minutes Intravenous Every 12 hours 12/18/12 1402 12/20/12 1800   12/18/12 1500  ceFEPIme (MAXIPIME) 1 g in dextrose 5 % 50 mL IVPB  1 g 100 mL/hr over 30 Minutes Intravenous 3 times per day 12/18/12 1455     12/18/12 1415  piperacillin-tazobactam (ZOSYN) IVPB 3.375 g  Status:  Discontinued     3.375 g 100 mL/hr over 30 Minutes Intravenous  Once 12/18/12 1401 12/18/12 1455      Assessment: 79 yo M with rule out HCAP. Hx of NSC lung CA.   Goal of Therapy:  Vancomycin trough level 15-20 mcg/ml  Plan:  Vancomycin trough level= 11.9 Will increase to Vancomycin 1000mg  IV q 12 hours starting around 6pm Measure antibiotic drug levels at steady state Follow up cultures.  Loletta Specter 12/20/2012,7:00 PM

## 2012-12-20 NOTE — Progress Notes (Signed)
[facility] PROGRESS NOTE  Justin Pittman DOA: 12/18/2012 PCP: Justin Relic, MD Oncologist: Justin Pittman  Brief narrative: Justin Pittman is an 79 y.o. male with a PMH of non-small cell lung cancer, receiving palliative chemotherapy by Justin Pittman, whose last chemotherapy was given 12/04/2012, who was admitted 12/18/2012 with healthcare associated pneumonia in the setting of neutropenia.  Assessment/Plan: Principal Problem:   Healthcare-associated pneumonia in the setting of neutropenia -Admitted and placed on empiric IV vancomycin and Zosyn. -Followup blood and sputum cultures. -Strep pneumonia antigen negative. Legionella antigen negative. -Neupogen started to address neutropenia. Active Problems:   Non-small cell lung cancer -Justin Pittman aware of the patient's admission.   Dehydration / hyponatremia -Resolved with IV fluids.   Adult failure to thrive / severe protein calorie malnutrition -Secondary to advanced malignancy. -Seen by dietitian 12/19/2012. Ensure supplements ordered. -PT evaluations requested.  Code Status: Full. Family Communication: No family at the bedside. Disposition Plan: Home when stable.   Medical Consultants:  None.  Other Consultants:  Dietitian.  Anti-infectives:  Vancomycin 12/18/2012--->  Cefepime 12/18/2012--->  HPI/Subjective: Justin Pittman denies dyspnea at rest but reports some dyspnea with any exertion. Denies cough. No current complaints of pain, although he took a pain pill last night. No nausea or vomiting. He ate a good dinner but only had coffee for breakfast.  Objective: Filed Vitals:   12/19/12 1400 12/19/12 2215 12/19/12 2310 12/20/12 0418  BP: 116/60 105/57  124/60  Pulse: 86 62 88 79  Temp: 98 F (36.7 C) 98 F (36.7 C)  98.4 F (36.9 C)  TempSrc: Oral   Oral  Resp: 18 18  18   Height:      Weight:      SpO2: 98% 98%  96%    Intake/Output Summary (Last 24 hours)  at 12/20/12 1051 Last data filed at 12/20/12 1000  Gross per 24 hour  Intake 2478.75 ml  Output   2750 ml  Net -271.25 ml    Exam: Gen:  NAD Cardiovascular:  RRR, No M/R/G Respiratory:  Lungs diminished on the right with scattered high pitched wheezes. Gastrointestinal:  Abdomen soft, NT/ND, + BS Extremities:  No edema, + clubbing  Data Reviewed: Basic Metabolic Panel:  Recent Labs Lab 12/18/12 1138 12/19/12 0410  NA 132* 136  K 3.9 4.0  CL 97 103  CO2 26 25  GLUCOSE 95 94  BUN 16 12  CREATININE 1.20 1.16  CALCIUM 9.4 8.7   GFR Estimated Creatinine Clearance: 46.7 ml/min (by C-G formula based on Cr of 1.16). Liver Function Tests:  Recent Labs Lab 12/18/12 1138 12/19/12 0410  AST 21 15  ALT 14 11  ALKPHOS 142* 125*  BILITOT 0.3 0.4  PROT 6.9 5.9*  ALBUMIN 3.4* 2.9*    Recent Labs Lab 12/18/12 1138  LIPASE 19   CBC:  Recent Labs Lab 12/18/12 1138 12/19/12 0410 12/20/12 0350  WBC 0.8* 1.2* 2.7*  NEUTROABS 0.1*  --  1.1*  HGB 8.4* 8.0* 8.3*  HCT 24.4* 23.0* 24.1*  MCV 79.5 79.6 80.9  PLT 138* 139* 170   Microbiology Recent Results (from the past 240 hour(s))  CULTURE, BLOOD (ROUTINE X 2)     Status: None   Collection Time    12/18/12  2:40 PM      Result Value Range Status   Specimen Description BLOOD RIGHT ARM   Final   Special Requests BOTTLES DRAWN AEROBIC AND ANAEROBIC 5CC   Final   Culture  Setup  Time     Final   Value: 12/18/2012 22:58     Performed at [facility]   Culture     Final   Value:        BLOOD CULTURE RECEIVED NO GROWTH TO DATE CULTURE WILL BE HELD FOR 5 DAYS BEFORE ISSUING A FINAL NEGATIVE REPORT     Performed at [facility]   Report Status PENDING   Incomplete  CULTURE, BLOOD (ROUTINE X 2)     Status: None   Collection Time    12/18/12  2:47 PM      Result Value Range Status   Specimen Description BLOOD LEFT HAND   Final   Special Requests BOTTLES DRAWN AEROBIC ONLY 3CC   Final   Culture  Setup  Time     Final   Value: 12/18/2012 22:58     Performed at [facility]   Culture     Final   Value:        BLOOD CULTURE RECEIVED NO GROWTH TO DATE CULTURE WILL BE HELD FOR 5 DAYS BEFORE ISSUING A FINAL NEGATIVE REPORT     Performed at [facility]   Report Status PENDING   Incomplete     Procedures and Diagnostic Studies: Dg Chest 2 View  12/18/2012   CLINICAL DATA:  History of lung carcinoma, weakness  EXAM: CHEST  2 VIEW  COMPARISON:  05/28/2012  FINDINGS: Cardiac shadow is stable. The left lung is well aerated. There is volume loss in the right lung consistent with the patient's known history an likely post obstructive change. No other focal abnormality is noted.  IMPRESSION: Increasing right hilar disease with post obstructive changes in the medial aspect of the right lower lobe.   Electronically Signed   By: Justin Pittman M.D.   On: 12/18/2012 12:49    Scheduled Meds: . ceFEPime (MAXIPIME) IV  1 g Intravenous Q8H  . enoxaparin (LOVENOX) injection  40 mg Subcutaneous Q24H  . feeding supplement (ENSURE COMPLETE)  237 mL Oral BID BM  . mirtazapine  7.5 mg Oral QHS  . pantoprazole  40 mg Oral BID  . tamsulosin  0.4 mg Oral QPC supper  . vancomycin  750 mg Intravenous Q12H   Continuous Infusions: . sodium chloride 1,000 mL (12/20/12 0505)    Time spent: 25 minutes.   LOS: 2 days   Justin Pittman  [facility] Pager [phone].   *Please note that the hospitalists switch teams on Wednesdays. Please call the flow manager at [phone] if you are having difficulty reaching the hospitalist taking care of this patient as she can update you and provide the most up-to-date pager number of provider caring for the patient. If 8PM-8AM, please contact night-coverage at [email or web address], password [facility]  12/20/2012, 10:51 AM      In an effort to keep you and your family informed about your hospital stay, I am providing you with this information sheet. If you or your  family have any questions, please do not hesitate to have the nursing staff page me to set up a meeting time.  Justin Pittman 12/20/2012 2 (Number of days in the hospital)  Treatment team:  Dr. Hillery Aldo, Hospitalist (Internist)  Justin Pittman, Oncologist  Active Treatment Issues with Plan: Principal Problem:   Healthcare-associated pneumonia in the setting of low neutrophil count (cells that fight infection) -Being treated with potent antibiotics: vancomycin and cefepime. -Your blood and sputum cultures are pending (takes 3 days to get these  back typically). So far, they remain negative -Neupogen started 12/19/2012 to help your counts recover. Active Problems:   Non-small cell lung cancer -Justin Pittman aware of your hospitalization.   Dehydration / low sodium -Your sodium is normal today.   Malnutrition. -Seen by the dietitian and 12/19/2012 who ordered Ensure supplements for you. -Physical therapy evaluation when stable.  Significant Lab results:  Recent Labs Lab 12/18/12 1138 12/19/12 0410 12/20/12 0350  WBC 0.8* 1.2* 2.7*  NEUTROABS 0.1*  --  1.1*  HGB 8.4* 8.0* 8.3*  HCT 24.4* 23.0* 24.1*  MCV 79.5 79.6 80.9  PLT 138* 139* 170   Significant diagnostic study results: -Chest x-ray 12/18/2012: right lower lobe pneumonia  Anticipated discharge date: 1-2 days depending on progress.

## 2012-12-20 NOTE — Evaluation (Signed)
Physical Therapy Evaluation Patient Details Name: Justin Pittman MRN: [id number] DOB: [birth year] Today's Date: 12/20/2012 Time: 4098-1191 PT Time Calculation (min): 11 min  PT Assessment / Plan / Recommendation History of Present Illness  79 y.o. male with a PMH of non-small cell lung cancer, receiving palliative chemotherapy by Dr. Arbutus Ped, whose last chemotherapy was given 12/04/2012, who was admitted 12/18/2012 with healthcare associated pneumonia in the setting of neutropenia.  Clinical Impression  Pt admitted with above. Pt currently with functional limitations due to the deficits listed below (see PT Problem List).  Pt will benefit from skilled PT to increase their independence and safety with mobility to allow discharge to the venue listed below.  Pt a little weak and unsteady first time out of bed however will likely progress well.  Discussed HHPT however pt reports he has had "people come to the house" (not PT however) and not been pleased so he is considering this option.  Pt states son lives at home however works and spouse available.  Pt declined any DME stating "I don't need anything."     PT Assessment  Patient needs continued PT services    Follow Up Recommendations  Home health PT    Does the patient have the potential to tolerate intense rehabilitation      Barriers to Discharge        Equipment Recommendations  None recommended by PT    Recommendations for Other Services     Frequency Min 3X/week    Precautions / Restrictions     Pertinent Vitals/Pain Denies pain, c/o fatigue and weakness only      Mobility  Bed Mobility Bed Mobility: Supine to Sit Supine to Sit: 6: Modified independent (Device/Increase time) Transfers Transfers: Sit to Stand;Stand to Sit Sit to Stand: 4: Min guard;With upper extremity assist;From bed Stand to Sit: With upper extremity assist;4: Min guard;To bed Details for Transfer Assistance: min/guard initially due to pt reporting  fatigue and weakness  Ambulation/Gait Ambulation/Gait Assistance: 4: Min guard Ambulation Distance (Feet): 8 Feet (x4) Assistive device: None Ambulation/Gait Assistance Details: pt ambulated to door way and leaned against door then stated he needed to return to bed as he felt too weak to ambulate today, rested on EOB then pt requested use of bathroom and improved to supervision level to/from bathroom as he also reports slight wooziness during ambulation Gait Pattern: Decreased stride length;Step-through pattern General Gait Details: staggering gait to doorway however improved when ambulating to/from bathroom, pt reports not ambulated in 2 days    Exercises     PT Diagnosis: Difficulty walking;Generalized weakness  PT Problem List: Decreased strength;Decreased activity tolerance;Decreased mobility;Decreased knowledge of use of DME PT Treatment Interventions: DME instruction;Gait training;Functional mobility training;Therapeutic exercise;Therapeutic activities;Patient/family education     PT Goals(Current goals can be found in the care plan section) Acute Rehab PT Goals PT Goal Formulation: With patient Time For Goal Achievement: 12/27/12 Potential to Achieve Goals: Good  Visit Information  Last PT Received On: 12/20/12 Assistance Needed: +1 History of Present Illness: 79 y.o. male with a PMH of non-small cell lung cancer, receiving palliative chemotherapy by Dr. Arbutus Ped, whose last chemotherapy was given 12/04/2012, who was admitted 12/18/2012 with healthcare associated pneumonia in the setting of neutropenia.       Prior Functioning  Home Living Family/patient expects to be discharged to:: Private residence Living Arrangements: Spouse/significant other;Children (son) Available Help at Discharge: Family;Available PRN/intermittently Type of Home: House Home Access: Stairs to enter Entrance Stairs-Number of Steps: 1 small step/threshold  Entrance Stairs-Rails: None Home Layout: One  level Home Equipment: None Prior Function Level of Independence: Independent Communication Communication: No difficulties    Cognition  Cognition Arousal/Alertness: Awake/alert Behavior During Therapy: WFL for tasks assessed/performed Overall Cognitive Status: Within Functional Limits for tasks assessed    Extremity/Trunk Assessment Lower Extremity Assessment Lower Extremity Assessment: Generalized weakness   Balance    End of Session PT - End of Session Activity Tolerance: Patient limited by fatigue Patient left: in bed;with call bell/phone within reach;Other (comment) (pt requested sitting EOB, call bell in reach, educated not to get up alone)  GP     Redonna Wilbert,KATHrine E 12/20/2012, 12:33 PM Zenovia Jarred, PT, DPT 12/20/2012 Pager: [phone]

## 2012-12-20 NOTE — Progress Notes (Signed)
ANTIBIOTIC CONSULT NOTE - Follow Up  Pharmacy Consult for Vancomycin & Cefepime Indication: R/O HCAP, neutropenic  No Known Allergies  Patient Measurements: Height: 5\' 10"  (177.8 cm) Weight: 138 lb 9.6 oz (62.869 kg) IBW/kg (Calculated) : 73Total body weight: 63 kg  Vital Signs: Temp: 98.4 F (36.9 C) (10/24 0418) Temp src: Oral (10/24 0418) BP: 124/60 mmHg (10/24 0418) Pulse Rate: 79 (10/24 0418) Intake/Output from previous day: 10/23 0701 - 10/24 0700 In: 2238.8 [P.O.:30; I.V.:2008.8; IV Piggyback:200] Out: 1900 [Urine:1900] Intake/Output from this shift:    Labs:  Recent Labs  12/18/12 1138 12/19/12 0410 12/20/12 0350  WBC 0.8* 1.2* 2.7*  HGB 8.4* 8.0* 8.3*  PLT 138* 139* 170  CREATININE 1.20 1.16  --    Estimated Creatinine Clearance: 46.7 ml/min (by C-G formula based on Cr of 1.16). No results found for this basename: VANCOTROUGH, VANCOPEAK, VANCORANDOM, GENTTROUGH, GENTPEAK, GENTRANDOM, TOBRATROUGH, TOBRAPEAK, TOBRARND, AMIKACINPEAK, AMIKACINTROU, AMIKACIN,  in the last 72 hours   Microbiology: Recent Results (from the past 720 hour(s))  CULTURE, BLOOD (ROUTINE X 2)     Status: None   Collection Time    12/18/12  2:40 PM      Result Value Range Status   Specimen Description BLOOD RIGHT ARM   Final   Special Requests BOTTLES DRAWN AEROBIC AND ANAEROBIC 5CC   Final   Culture  Setup Time     Final   Value: 12/18/2012 22:58     Performed at [facility]   Culture     Final   Value:        BLOOD CULTURE RECEIVED NO GROWTH TO DATE CULTURE WILL BE HELD FOR 5 DAYS BEFORE ISSUING A FINAL NEGATIVE REPORT     Performed at [facility]   Report Status PENDING   Incomplete  CULTURE, BLOOD (ROUTINE X 2)     Status: None   Collection Time    12/18/12  2:47 PM      Result Value Range Status   Specimen Description BLOOD LEFT HAND   Final   Special Requests BOTTLES DRAWN AEROBIC ONLY 3CC   Final   Culture  Setup Time     Final   Value: 12/18/2012  22:58     Performed at [facility]   Culture     Final   Value:        BLOOD CULTURE RECEIVED NO GROWTH TO DATE CULTURE WILL BE HELD FOR 5 DAYS BEFORE ISSUING A FINAL NEGATIVE REPORT     Performed at [facility]   Report Status PENDING   Incomplete    Medical History: Past Medical History  Diagnosis Date  . HTN (hypertension)   . Lung cancer     Squamous Cell  . Vertigo 2010    Hospitalized a [facility]  . Hyperlipidemia   . Lung cancer   . Loss of weight   . Pyoderma 03/12/2012  . Contact dermatitis and other eczema due to other specified agent   . Tachycardia, unspecified 01/17/2012  . Candidiasis of mouth 01/01/2012  . Insomnia, unspecified 01/01/2012  . Malignant neoplasm of bronchus and lung, unspecified site 12/29/2011  . Hypopotassemia 12/29/2011  . Other pancytopenia 12/29/2011  . Anemia of other chronic disease 12/29/2011  . Pneumonitis due to inhalation of food or vomitus 12/29/2011  . Other esophagitis 12/29/2011   Medications:  Anti-infectives   Start     Dose/Rate Route Frequency Ordered Stop   12/18/12 2200  piperacillin-tazobactam (ZOSYN) IVPB 3.375  g  Status:  Discontinued     3.375 g 12.5 mL/hr over 240 Minutes Intravenous 3 times per day 12/18/12 1403 12/18/12 1443   12/18/12 1600  vancomycin (VANCOCIN) IVPB 750 mg/150 ml premix     750 mg 150 mL/hr over 60 Minutes Intravenous Every 12 hours 12/18/12 1402     12/18/12 1500  ceFEPIme (MAXIPIME) 1 g in dextrose 5 % 50 mL IVPB     1 g 100 mL/hr over 30 Minutes Intravenous 3 times per day 12/18/12 1455     12/18/12 1415  piperacillin-tazobactam (ZOSYN) IVPB 3.375 g  Status:  Discontinued     3.375 g 100 mL/hr over 30 Minutes Intravenous  Once 12/18/12 1401 12/18/12 1455     Assessment: 80 yoM admitted from ED; rule out HCAP. Hx of NSC Lung Ca, 7 day hx of weakness, dizziness. Productive cough, vomited phlegm. Blood cx ordered, Vancomycin & Zosyn ordered by ED physician, then Vancomycin &  Cefepime ordered by hospitalist. Discontinue Zosyn.  Chemo for NSC Lung Ca: Carbo d1/Gemzaar d1,8: Cycle 1 completed 10/8, was scheduled for Cycle 2 1022  WBC 2.7, improving, afebrile - neupogen given 10/22  Day 3 Vanc/Cefepime  Cultures NGTD  Goal of Therapy:  Vancomycin trough level 15-20 mcg/ml  Plan:  1) Continue Cefepime 1g IV q8 2) Continue vancomycin 750mg  IV q12 but will check trough prior to next dose tonight at 6pm (prior to 5th dose).   Hessie Knows, PharmD, BCPS Pager [phone] 12/20/2012 8:35 AM

## 2012-12-21 LAB — CBC
HCT: 24.7 % — ABNORMAL LOW (ref 39.0–52.0)
Hemoglobin: 8.5 g/dL — ABNORMAL LOW (ref 13.0–17.0)
MCV: 79.4 fL (ref 78.0–100.0)
RBC: 3.11 MIL/uL — ABNORMAL LOW (ref 4.22–5.81)
WBC: 4.8 10*3/uL (ref 4.0–10.5)

## 2012-12-21 MED ORDER — SODIUM CHLORIDE 0.9 % IV SOLN: INTRAVENOUS | Status: DC | PRN

## 2012-12-21 NOTE — Plan of Care (Signed)
Problem: Phase II Progression Outcomes Goal: IV changed to normal saline lock Outcome: Completed/Met Date Met:  12/21/12 kvo fluids since tolerating diet without nausea

## 2012-12-21 NOTE — Progress Notes (Signed)
[facility] PROGRESS NOTE  Justin Pittman WUJ:[id number] DOB: Jan 20, 1933 DOA: 12/18/2012 PCP: Kimber Relic, MD Oncologist: Dr. Si Gaul  Brief narrative: Justin Pittman is an 79 y.o. male with a PMH of non-small cell lung cancer, receiving palliative chemotherapy by Dr. Arbutus Ped, whose last chemotherapy was given 12/04/2012, who was admitted 12/18/2012 with healthcare associated pneumonia in the setting of neutropenia.  Assessment/Plan: Principal Problem:   Healthcare-associated pneumonia in the setting of neutropenia -Admitted and placed on empiric IV vancomycin and Zosyn. -Blood cultures negative to date, sputum collected. -Strep pneumonia antigen negative. Legionella antigen negative. -Neupogen discontinued once ANC greater than 1000. Active Problems:   Non-small cell lung cancer -Dr. Arbutus Ped aware of the patient's admission.   Dehydration / hyponatremia -Resolved with IV fluids.   Adult failure to thrive / severe protein calorie malnutrition -Secondary to advanced malignancy. -Seen by dietitian 12/19/2012. Ensure supplements ordered. -Status post PT evaluation, home health PT recommended.  Code Status: Full. Family Communication: No family at the bedside. Disposition Plan: Home when stable.   Medical Consultants:  None.  Other Consultants:  Dietitian.  Anti-infectives:  Vancomycin 12/18/2012--->  Cefepime 12/18/2012--->  HPI/Subjective: Justin Pittman tells me he feels well enough for discharge today. Denies dyspnea and cough.  Objective: Filed Vitals:   12/20/12 1419 12/20/12 2137 12/20/12 2300 12/21/12 0547  BP: 148/68 143/68  129/60  Pulse: 84 80 78 82  Temp: 98.3 F (36.8 C) 98.4 F (36.9 C)  98.2 F (36.8 C)  TempSrc: Oral Oral  Oral  Resp: 16 20  16   Height:      Weight:      SpO2: 98% 95%  96%    Intake/Output Summary (Last 24 hours) at 12/21/12 0817 Last data filed at 12/21/12 0548  Gross per 24 hour  Intake   2245 ml   Output   1725 ml  Net    520 ml    Exam: Gen:  NAD Cardiovascular:  RRR, No M/R/G Respiratory:  Lungs diminished on the right with scattered high pitched wheezes. Gastrointestinal:  Abdomen soft, NT/ND, + BS Extremities:  No edema, + clubbing  Data Reviewed: Basic Metabolic Panel:  Recent Labs Lab 12/18/12 1138 12/19/12 0410  NA 132* 136  K 3.9 4.0  CL 97 103  CO2 26 25  GLUCOSE 95 94  BUN 16 12  CREATININE 1.20 1.16  CALCIUM 9.4 8.7   GFR Estimated Creatinine Clearance: 46.7 ml/min (by C-G formula based on Cr of 1.16). Liver Function Tests:  Recent Labs Lab 12/18/12 1138 12/19/12 0410  AST 21 15  ALT 14 11  ALKPHOS 142* 125*  BILITOT 0.3 0.4  PROT 6.9 5.9*  ALBUMIN 3.4* 2.9*    Recent Labs Lab 12/18/12 1138  LIPASE 19   CBC:  Recent Labs Lab 12/18/12 1138 12/19/12 0410 12/20/12 0350 12/21/12 0424  WBC 0.8* 1.2* 2.7* 4.8  NEUTROABS 0.1*  --  1.1*  --   HGB 8.4* 8.0* 8.3* 8.5*  HCT 24.4* 23.0* 24.1* 24.7*  MCV 79.5 79.6 80.9 79.4  PLT 138* 139* 170 165   Microbiology Recent Results (from the past 240 hour(s))  CULTURE, BLOOD (ROUTINE X 2)     Status: None   Collection Time    12/18/12  2:40 PM      Result Value Range Status   Specimen Description BLOOD RIGHT ARM   Final   Special Requests BOTTLES DRAWN AEROBIC AND ANAEROBIC 5CC   Final   Culture  Setup Time  Final   Value: 12/18/2012 22:58     Performed at [facility]   Culture     Final   Value:        BLOOD CULTURE RECEIVED NO GROWTH TO DATE CULTURE WILL BE HELD FOR 5 DAYS BEFORE ISSUING A FINAL NEGATIVE REPORT     Performed at [facility]   Report Status PENDING   Incomplete  CULTURE, BLOOD (ROUTINE X 2)     Status: None   Collection Time    12/18/12  2:47 PM      Result Value Range Status   Specimen Description BLOOD LEFT HAND   Final   Special Requests BOTTLES DRAWN AEROBIC ONLY 3CC   Final   Culture  Setup Time     Final   Value: 12/18/2012 22:58      Performed at [facility]   Culture     Final   Value:        BLOOD CULTURE RECEIVED NO GROWTH TO DATE CULTURE WILL BE HELD FOR 5 DAYS BEFORE ISSUING A FINAL NEGATIVE REPORT     Performed at [facility]   Report Status PENDING   Incomplete     Procedures and Diagnostic Studies: Dg Chest 2 View  12/18/2012   CLINICAL DATA:  History of lung carcinoma, weakness  EXAM: CHEST  2 VIEW  COMPARISON:  05/28/2012  FINDINGS: Cardiac shadow is stable. The left lung is well aerated. There is volume loss in the right lung consistent with the patient's known history an likely post obstructive change. No other focal abnormality is noted.  IMPRESSION: Increasing right hilar disease with post obstructive changes in the medial aspect of the right lower lobe.   Electronically Signed   By: Alcide Clever M.D.   On: 12/18/2012 12:49    Scheduled Meds: . ceFEPime (MAXIPIME) IV  1 g Intravenous Q8H  . enoxaparin (LOVENOX) injection  40 mg Subcutaneous Q24H  . feeding supplement (ENSURE COMPLETE)  237 mL Oral BID BM  . mirtazapine  7.5 mg Oral QHS  . pantoprazole  40 mg Oral BID  . tamsulosin  0.4 mg Oral QPC supper  . vancomycin  1,000 mg Intravenous Q12H   Continuous Infusions: . sodium chloride      Time spent: 25 minutes.   LOS: 3 days   Justin Pittman  [facility] Pager [phone].   *Please note that the hospitalists switch teams on Wednesdays. Please call the flow manager at [phone] if you are having difficulty reaching the hospitalist taking care of this patient as she can update you and provide the most up-to-date pager number of provider caring for the patient. If 8PM-8AM, please contact night-coverage at [email or web address], password [facility]  12/21/2012, 8:17 AM     In an effort to keep you and your family informed about your hospital stay, I am providing you with this information sheet. If you or your family have any questions, please do not hesitate to have the nursing staff  page me to set up a meeting time.  Justin Pittman 12/21/2012 3 (Number of days in the hospital)  Treatment team:  Dr. Hillery Aldo, Hospitalist (Internist)  Dr. Si Gaul, Oncologist  Active Treatment Issues with Plan: Principal Problem:   Healthcare-associated pneumonia in the setting of low neutrophil count (cells that fight infection) -Being treated with potent antibiotics: vancomycin and cefepime. -Your blood cultures continued to be negative. -Your blood cell count is normal today.. Active Problems:   Weakness -The physical  therapists saw you on 12/20/2012 and recommend home health physical therapy at discharge to help you regain your strength.   Non-small cell lung cancer -Dr. Arbutus Ped aware of your hospitalization.   Dehydration / low sodium -Resolved.   Malnutrition. -Seen by the dietitian and 12/19/2012 who ordered Ensure supplements for you. -Physical therapy evaluation when stable.  Significant Lab results:  Recent Labs Lab 12/18/12 1138 12/19/12 0410 12/20/12 0350 12/21/12 0424  WBC 0.8* 1.2* 2.7* 4.8  NEUTROABS 0.1*  --  1.1*  --   HGB 8.4* 8.0* 8.3* 8.5*  PLT 138* 139* 170 165   Recent Labs  Significant diagnostic study results: -Chest x-ray 12/18/2012: right lower lobe pneumonia  Anticipated discharge date: Today or tomorrow depending on how you feel.

## 2012-12-21 NOTE — Plan of Care (Signed)
Problem: Phase II Progression Outcomes Goal: Progress activity as tolerated unless otherwise ordered Outcome: Progressing Worked with PT on 10/14

## 2012-12-21 NOTE — Discharge Summary (Signed)
Physician Discharge Summary  [name]:[id number] DOB: [birth year] DOA: 12/18/2012  PCP: Kimber Relic, MD  Admit date: 12/18/2012 Discharge date: 12/21/2012  Recommendations for Outpatient Follow-up:  1. Followup final blood culture results. Negative to date. 2. Home health physical therapy set up at discharge.  Discharge Diagnoses:  Principal Problem:    Healthcare-associated pneumonia Active Problems:    Protein calorie malnutrition    Non-small cell lung cancer    Dehydration    Adult failure to thrive    Neutropenia    Protein-calorie malnutrition, severe  Discharge Condition: Improved.  Diet recommendation: Regular.  History of present illness:  Justin Pittman is an 79 y.o. male with a PMH of non-small cell lung cancer, receiving palliative chemotherapy by Dr. Arbutus Ped, whose last chemotherapy was given 12/04/2012, who was admitted 12/18/2012 with healthcare associated pneumonia in the setting of neutropenia.  Hospital Course by problem:  Principal Problem:  Healthcare-associated pneumonia in the setting of neutropenia  -Admitted and placed on empiric IV vancomycin and Zosyn. Can resume Cipro at discharge. -Blood cultures negative to date, unable to provide sputum for analysis.  -Strep pneumonia antigen negative. Legionella antigen negative.  -Neupogen discontinued once ANC greater than 1000.  Active Problems:  Non-small cell lung cancer  -Dr. Arbutus Ped aware of the patient's admission.  Dehydration / hyponatremia  -Resolved with IV fluids.  Adult failure to thrive / severe protein calorie malnutrition  -Secondary to advanced malignancy.  -Seen by dietitian 12/19/2012. Ensure supplements ordered.  -Status post PT evaluation, home health PT recommended.  Procedures:  None.  Consultations:  None.  Discharge Exam: Filed Vitals:   12/21/12 0547  BP: 129/60  Pulse: 82  Temp: 98.2 F (36.8 C)  Resp: 16   Filed Vitals:   12/20/12 1419  12/20/12 2137 12/20/12 2300 12/21/12 0547  BP: 148/68 143/68  129/60  Pulse: 84 80 78 82  Temp: 98.3 F (36.8 C) 98.4 F (36.9 C)  98.2 F (36.8 C)  TempSrc: Oral Oral  Oral  Resp: 16 20  16   Height:      Weight:      SpO2: 98% 95%  96%    Gen:  NAD Cardiovascular:  RRR, No M/R/G Respiratory: Lungs diminished on the right Gastrointestinal: Abdomen soft, NT/ND with normal active bowel sounds. Extremities: + clubbing, no edema   Discharge Instructions  Discharge Orders   Future Appointments Provider Department Dept Phone   12/25/2012 10:30 AM Delcie Roch [facility] [phone]   12/25/2012 11:00 AM [facility] [phone]   01/08/2013 11:30 AM [facility] [phone]   01/15/2013 12:00 PM Kimber Relic, MD [facility] [phone]   Future Orders Complete By Expires   Face-to-face encounter (required for Medicare/Medicaid patients)  As directed    Comments:     I RAMA,CHRISTINA certify that this patient is under my care and that I, or a nurse practitioner or physician's assistant working with me, had a face-to-face encounter that meets the physician face-to-face encounter requirements with this patient on 12/21/2012. The encounter with the patient was in whole, or in part for the following medical condition(s) which is the primary reason for home health care (List medical condition): Deconditioning post pneumonia.   Questions:     The encounter with the patient was in whole, or in part, for the following medical condition, which is the primary reason for home health care:  Deconditioning  post pneumonia   I certify that, based on my findings, the following services are medically necessary home health services:  Physical therapy   My clinical findings support the need for the above services:  Unable to leave home safely without assistance  and/or assistive device   Further, I certify that my clinical findings support that this patient is homebound due to:  Unable to leave home safely without assistance   Reason for Medically Necessary Home Health Services:  Therapy- Therapeutic Exercises to Increase Strength and Endurance   Home Health  As directed    Questions:     To provide the following care/treatments:  PT       Medication List         ciprofloxacin 500 MG tablet  Commonly known as:  CIPRO  Take 500 mg by mouth 2 (two) times daily.     ENSURE  Take 237 mLs by mouth daily.     HYDROcodone-acetaminophen 10-325 MG per tablet  Commonly known as:  NORCO  Take 1 tablet by mouth every 6 (six) hours as needed for pain.     mirtazapine 7.5 MG tablet  Commonly known as:  REMERON  Take 7.5 mg by mouth at bedtime.     pantoprazole 40 MG tablet  Commonly known as:  PROTONIX  Take 40 mg by mouth 2 (two) times daily. To  reduce stomach acid     prochlorperazine 10 MG tablet  Commonly known as:  COMPAZINE  Take 10 mg by mouth every 6 (six) hours as needed.     tamsulosin 0.4 MG Caps capsule  Commonly known as:  FLOMAX  Take 0.4 mg by mouth daily after supper.          The results of significant diagnostics from this hospitalization (including imaging, microbiology, ancillary and laboratory) are listed below for reference.    Significant Diagnostic Studies: Dg Chest 2 View  12/18/2012   CLINICAL DATA:  History of lung carcinoma, weakness  EXAM: CHEST  2 VIEW  COMPARISON:  05/28/2012  FINDINGS: Cardiac shadow is stable. The left lung is well aerated. There is volume loss in the right lung consistent with the patient's known history an likely post obstructive change. No other focal abnormality is noted.  IMPRESSION: Increasing right hilar disease with post obstructive changes in the medial aspect of the right lower lobe.   Electronically Signed   By: Alcide Clever M.D.   On: 12/18/2012 12:49   Mr Laqueta Jean [facility]  Contrast  11/22/2012   *RADIOLOGY REPORT*  Clinical Data: Non-small cell lung cancer. Staging. Possible new brain lesions.  MRI HEAD WITHOUT AND WITH CONTRAST  Technique:  Multiplanar, multiecho pulse sequences of the brain and surrounding structures were obtained according to standard protocol without and with intravenous contrast  Contrast: 13mL MULTIHANCE GADOBENATE DIMEGLUMINE 529 MG/ML IV SOLN  Comparison:  MRI brain 10/26/2011.  CT head 11/14/2012.  Findings: There is no evidence for acute infarction, intracranial hemorrhage, mass lesion, hydrocephalus, or extra-axial fluid. There is moderate atrophy with chronic microvascular ischemic change, likely sequelae of hypertension.  No foci of chronic hemorrhage.  Scattered areas of lacunar infarction representing chronic ischemia are seen.  Flow voids are maintained.  No large vessel infarct.  Post infusion, there is enhancement of a periventricular venous angioma in the left occipital pole.  This corresponds to the enhancement noted on recent CT.  This area is unchanged from 2013. No intra-axial metastatic deposits are demonstrated.  There are no calvarial  lesions.  Major venous sinuses are patent.  No osseous lesions.  Upper cervical region unremarkable.  Normal pituitary and cerebellar tonsils.  No acute sinus or mastoid disease.  Negative appearing orbits.  IMPRESSION: No intracranial metastatic disease is evident.  Incidental left occipital pole periventricular enhancing lesion representing a venous angioma is stable from 2013.   Original Report Authenticated By: Davonna Belling, M.D.    Labs:  Basic Metabolic Panel:  Recent Labs Lab 12/18/12 1138 12/19/12 0410  NA 132* 136  K 3.9 4.0  CL 97 103  CO2 26 25  GLUCOSE 95 94  BUN 16 12  CREATININE 1.20 1.16  CALCIUM 9.4 8.7   GFR Estimated Creatinine Clearance: 46.7 ml/min (by C-G formula based on Cr of 1.16). Liver Function Tests:  Recent Labs Lab 12/18/12 1138 12/19/12 0410  AST 21 15   ALT 14 11  ALKPHOS 142* 125*  BILITOT 0.3 0.4  PROT 6.9 5.9*  ALBUMIN 3.4* 2.9*    Recent Labs Lab 12/18/12 1138  LIPASE 19   CBC:  Recent Labs Lab 12/18/12 1138 12/19/12 0410 12/20/12 0350 12/21/12 0424  WBC 0.8* 1.2* 2.7* 4.8  NEUTROABS 0.1*  --  1.1*  --   HGB 8.4* 8.0* 8.3* 8.5*  HCT 24.4* 23.0* 24.1* 24.7*  MCV 79.5 79.6 80.9 79.4  PLT 138* 139* 170 165   Microbiology Recent Results (from the past 240 hour(s))  CULTURE, BLOOD (ROUTINE X 2)     Status: None   Collection Time    12/18/12  2:40 PM      Result Value Range Status   Specimen Description BLOOD RIGHT ARM   Final   Special Requests BOTTLES DRAWN AEROBIC AND ANAEROBIC 5CC   Final   Culture  Setup Time     Final   Value: 12/18/2012 22:58     Performed at [facility]   Culture     Final   Value:        BLOOD CULTURE RECEIVED NO GROWTH TO DATE CULTURE WILL BE HELD FOR 5 DAYS BEFORE ISSUING A FINAL NEGATIVE REPORT     Performed at [facility]   Report Status PENDING   Incomplete  CULTURE, BLOOD (ROUTINE X 2)     Status: None   Collection Time    12/18/12  2:47 PM      Result Value Range Status   Specimen Description BLOOD LEFT HAND   Final   Special Requests BOTTLES DRAWN AEROBIC ONLY 3CC   Final   Culture  Setup Time     Final   Value: 12/18/2012 22:58     Performed at [facility]   Culture     Final   Value:        BLOOD CULTURE RECEIVED NO GROWTH TO DATE CULTURE WILL BE HELD FOR 5 DAYS BEFORE ISSUING A FINAL NEGATIVE REPORT     Performed at [facility]   Report Status PENDING   Incomplete    Time coordinating discharge: 35 minutes.  Signed:  RAMA,CHRISTINA  Pager [phone] [facility] 12/21/2012, 1:10 PM

## 2012-12-21 NOTE — Progress Notes (Signed)
   CARE MANAGEMENT NOTE 12/21/2012  Patient:  Justin Pittman, Justin Pittman   Account Number:  [id number]  Date Initiated:  12/18/2012  Documentation initiated by:  DAVIS,TYMEEKA  Subjective/Objective Assessment:   79 yo male admitted with CAP. PCP: Kimber Relic, MD     Action/Plan:   Home when stable   Anticipated DC Date:  12/21/2012   Anticipated DC Plan:  HOME/SELF CARE      DC Planning Services  CM consult      Choice offered to / List presented to:          [facility] arranged  HH - 11 Patient Refused      Status of service:  Completed, signed off Medicare Important Message given?   (If response is "NO", the following Medicare IM given date fields will be blank) Date Medicare IM given:   Date Additional Medicare IM given:    Discharge Disposition:  HOME/SELF CARE  Per UR Regulation:  Reviewed for med. necessity/level of care/duration of stay  If discussed at Long Length of Stay Meetings, dates discussed:    Comments:  12/21/2012 1419 NCM spoke to pt and states he had a bad experience with [facility] and currently does not want any HH PT. States he does not need any DME at this time. NCM explained to pt if he decides on [facility] that his PCP can order from the his office. Pt verbalized understanding. Isidoro Donning RN CCM Case Mgmt phone [phone]  12/18/12 1425 Tymeeka Davis,RN,MSN [phone] Chart reviewed for utilization. PTA pt from home with family.

## 2012-12-21 NOTE — Progress Notes (Signed)
Patient was stable at time of discharge. Removed IV. Reviewed discharge education with patient family. They verbalized understanding and had not further questions. Patient left with his belongings.

## 2012-12-23 ENCOUNTER — Telehealth: Payer: Self-pay | Admitting: Medical Oncology

## 2012-12-23 NOTE — Telephone Encounter (Signed)
Confirmed appt with Arbutus Ped on Friday and cancelled appts on wed 12/25/12.

## 2012-12-24 ENCOUNTER — Encounter: Payer: Self-pay | Admitting: Internal Medicine

## 2012-12-24 ENCOUNTER — Ambulatory Visit (INDEPENDENT_AMBULATORY_CARE_PROVIDER_SITE_OTHER): Payer: Medicare Other | Admitting: Internal Medicine

## 2012-12-24 VITALS — BP 130/80 | HR 95 | Temp 97.3°F | Wt 133.0 lb

## 2012-12-24 DIAGNOSIS — G8929 Other chronic pain: Secondary | ICD-10-CM

## 2012-12-24 DIAGNOSIS — C3490 Malignant neoplasm of unspecified part of unspecified bronchus or lung: Secondary | ICD-10-CM

## 2012-12-24 DIAGNOSIS — R634 Abnormal weight loss: Secondary | ICD-10-CM

## 2012-12-24 DIAGNOSIS — J690 Pneumonitis due to inhalation of food and vomit: Secondary | ICD-10-CM

## 2012-12-24 DIAGNOSIS — R5381 Other malaise: Secondary | ICD-10-CM

## 2012-12-24 DIAGNOSIS — E43 Unspecified severe protein-calorie malnutrition: Secondary | ICD-10-CM

## 2012-12-24 DIAGNOSIS — J189 Pneumonia, unspecified organism: Secondary | ICD-10-CM

## 2012-12-24 DIAGNOSIS — I10 Essential (primary) hypertension: Secondary | ICD-10-CM

## 2012-12-24 DIAGNOSIS — R1011 Right upper quadrant pain: Secondary | ICD-10-CM

## 2012-12-24 DIAGNOSIS — R112 Nausea with vomiting, unspecified: Secondary | ICD-10-CM

## 2012-12-24 DIAGNOSIS — D638 Anemia in other chronic diseases classified elsewhere: Secondary | ICD-10-CM

## 2012-12-24 DIAGNOSIS — R627 Adult failure to thrive: Secondary | ICD-10-CM

## 2012-12-24 DIAGNOSIS — D61818 Other pancytopenia: Secondary | ICD-10-CM

## 2012-12-24 LAB — CULTURE, BLOOD (ROUTINE X 2)
Culture: NO GROWTH
Culture: NO GROWTH

## 2012-12-24 MED ORDER — PANTOPRAZOLE SODIUM 40 MG PO TBEC: 40.0000 mg | DELAYED_RELEASE_TABLET | Freq: Two times a day (BID) | ORAL | Status: AC

## 2012-12-24 NOTE — Patient Instructions (Signed)
Continue current medicatin.  Discuss the value of continuing chemotherapy with Dr. Arbutus Ped. It is OK to try off the mirtazipine.

## 2012-12-25 ENCOUNTER — Other Ambulatory Visit: Payer: Medicare Other | Admitting: Lab

## 2012-12-25 ENCOUNTER — Ambulatory Visit: Payer: Medicare Other

## 2012-12-25 DIAGNOSIS — R112 Nausea with vomiting, unspecified: Secondary | ICD-10-CM | POA: Insufficient documentation

## 2012-12-25 NOTE — Progress Notes (Signed)
Subjective:    Patient ID: Justin Pittman, male    DOB: [birth year], 79 y.o.   MRN: [id number]  Chief Complaint  Patient presents with  . Vomiting    Patient c/o vomitting related to drainage   . Hospitalization Follow-up    patient was seen in the hospital for pneumonia   . Medication Management    Discuss if patient should take rest of Cipro  . Dizziness    Discuss therapy for dizziness     HPI Hospitalized 12/18/12 to 12/21/12 for pneumonia. Has not been able to use Cipro due to nausea and vomiting episodes in the mornings for the last 3 days.  Nausea and vomiting is worse in the morning. He takes compazine and begins to get better. No hematemesis. No fever. Mild abd discomfort. He has had stools that seem normal.  There is dizziness when he rises or turns his head. Episodes are intermittent.  He has been getting chemotherapy for relapse of his lung cancer. He feels quite weak and may not be tolerating the therapy well. He had significant pancytopenia 2 weeks ago and remains anemic, although WBC and plt count have recovered. Family says he received Neupogen. He is to see Dr. Arbutus Ped on 12/27/12.     Review of Systems  Constitutional: Positive for fatigue. Negative for fever, chills, diaphoresis, activity change and appetite change.  HENT: Negative.   Eyes: Negative.   Respiratory: Positive for shortness of breath. Negative for cough and wheezing.        Hx lung cancer. Pain in the right upper abdomen and lower chest.  Cardiovascular: Negative.   Gastrointestinal: Positive for nausea, vomiting and abdominal pain (diffuse mild upper abd discomfort).  Endocrine: Negative.   Genitourinary: Negative.   Musculoskeletal: Negative.   Skin: Positive for pallor.       Prior area of proud flesh at site of PEG tube insertion has healed.  Allergic/Immunologic: Negative.   Neurological: Negative.   Hematological: Negative.   Psychiatric/Behavioral: Negative.        Objective:   Physical Exam  Constitutional: He is oriented to person, place, and time. He appears distressed.  Thin. Generalized weakness.  HENT:  Head: Normocephalic and atraumatic.  Right Ear: External ear normal.  Left Ear: External ear normal.  Nose: Nose normal.  Mouth/Throat: Oropharynx is clear and moist.  Eyes: Conjunctivae and EOM are normal. Pupils are equal, round, and reactive to light. Right eye exhibits no discharge. Left eye exhibits no discharge.  Neck: Normal range of motion. Neck supple. No JVD present. No tracheal deviation present. No thyromegaly present.  Cardiovascular: Normal rate, regular rhythm and intact distal pulses.  Exam reveals gallop. Exam reveals no friction rub.   No murmur heard. Pulmonary/Chest: Effort normal and breath sounds normal. No respiratory distress. He has no wheezes. He has no rales. He exhibits no tenderness.  Abdominal: Soft. Bowel sounds are normal. He exhibits no distension and no mass. There is tenderness.  Musculoskeletal: Normal range of motion. He exhibits no edema and no tenderness.  Lymphadenopathy:    He has no cervical adenopathy.  Neurological: He is alert and oriented to person, place, and time. He has normal reflexes. No cranial nerve deficit. Coordination normal.  Skin: Skin is warm and dry. No rash noted. He is not diaphoretic. No erythema. There is pallor.  Psychiatric: He has a normal mood and affect. His behavior is normal. Judgment and thought content normal.          Assessment &  Plan:  Aspiration pneumonia: recovering   - Plan: Finish Cipro  pantoprazole (PROTONIX) 40 MG tablet  Radiation esophagitis: continue pantoprazole  Hypertension: controlled  Loss of weight: related to relapse of cancer, chemotx, recent pneumonia, and vomiting episodes  Anemia of chronic disease:;may be related to chemo  Adult failure to thrive: multifactorial  Abdominal pain, chronic, right upper quadrant; likely has mild ileus and has llung  mets  Non-small cell lung cancer, unspecified laterality: relapsed  Pancytopenia: chemo induced  Other malaise and fatigue: multifactorial  Protein-calorie malnutrition, severe:poor intake  Nausea with vomiting: continue Compazine. Take one at MN to 2 AM whe you get up to urinate.  Healthcare-associated pneumonia; resolving

## 2012-12-27 ENCOUNTER — Ambulatory Visit: Payer: Medicare Other | Admitting: Internal Medicine

## 2012-12-27 ENCOUNTER — Telehealth: Payer: Self-pay | Admitting: Medical Oncology

## 2012-12-27 NOTE — Telephone Encounter (Signed)
Wife called back and said she is concerned about Justin Pittman now because  he will not respond to her. She reports that Justin Pittman is very weak and nto eating much.She does not think she can wait for an appointment next week with the way he is . I encouraged her to take him to [facility] ed to be evaluated.

## 2012-12-27 NOTE — Progress Notes (Signed)
Erroneous encounter

## 2012-12-27 NOTE — Telephone Encounter (Signed)
Wife stated it is impossible to get him here by 0900 . She could not get him here today. She wants to talk to Dr Arbutus Ped -[phone]. Asking questions about change in chemo.I told her i will give message to Dr. Arbutus Ped to see when to schedule Justin Pittman.

## 2012-12-28 NOTE — Progress Notes (Signed)
This encounter was created in error - please disregard.

## 2012-12-30 ENCOUNTER — Other Ambulatory Visit: Payer: Medicare Other | Admitting: Lab

## 2012-12-30 ENCOUNTER — Telehealth: Payer: Self-pay | Admitting: Internal Medicine

## 2012-12-30 NOTE — Telephone Encounter (Signed)
returned pt call and he needed to s/w Dr. Kerry Fort due to health problems i transferred call to desk nurse

## 2012-12-31 ENCOUNTER — Telehealth: Payer: Self-pay | Admitting: Internal Medicine

## 2012-12-31 ENCOUNTER — Ambulatory Visit (HOSPITAL_BASED_OUTPATIENT_CLINIC_OR_DEPARTMENT_OTHER): Payer: Medicare Other | Admitting: Internal Medicine

## 2012-12-31 ENCOUNTER — Encounter: Payer: Self-pay | Admitting: Internal Medicine

## 2012-12-31 ENCOUNTER — Ambulatory Visit (HOSPITAL_BASED_OUTPATIENT_CLINIC_OR_DEPARTMENT_OTHER): Payer: Medicare Other | Admitting: Lab

## 2012-12-31 VITALS — BP 125/77 | HR 115 | Temp 96.8°F | Resp 16 | Ht 70.0 in | Wt 131.4 lb

## 2012-12-31 DIAGNOSIS — C3430 Malignant neoplasm of lower lobe, unspecified bronchus or lung: Secondary | ICD-10-CM

## 2012-12-31 DIAGNOSIS — C3491 Malignant neoplasm of unspecified part of right bronchus or lung: Secondary | ICD-10-CM

## 2012-12-31 DIAGNOSIS — C3490 Malignant neoplasm of unspecified part of unspecified bronchus or lung: Secondary | ICD-10-CM | POA: Insufficient documentation

## 2012-12-31 LAB — CBC WITH DIFFERENTIAL/PLATELET
BASO%: 0.6 % (ref 0.0–2.0)
Basophils Absolute: 0 10*3/uL (ref 0.0–0.1)
EOS%: 0.3 % (ref 0.0–7.0)
Eosinophils Absolute: 0 10*3/uL (ref 0.0–0.5)
HCT: 30.1 % — ABNORMAL LOW (ref 38.4–49.9)
HGB: 9.7 g/dL — ABNORMAL LOW (ref 13.0–17.1)
MCH: 26.7 pg — ABNORMAL LOW (ref 27.2–33.4)
MCV: 82.4 fL (ref 79.3–98.0)
MONO%: 13.7 % (ref 0.0–14.0)
NEUT#: 5.8 10*3/uL (ref 1.5–6.5)
NEUT%: 77.3 % — ABNORMAL HIGH (ref 39.0–75.0)
Platelets: 410 10*3/uL — ABNORMAL HIGH (ref 140–400)
RDW: 15.2 % — ABNORMAL HIGH (ref 11.0–14.6)

## 2012-12-31 LAB — COMPREHENSIVE METABOLIC PANEL (CC13)
ALT: 7 U/L (ref 0–55)
AST: 14 U/L (ref 5–34)
Albumin: 3.3 g/dL — ABNORMAL LOW (ref 3.5–5.0)
Alkaline Phosphatase: 128 U/L (ref 40–150)
BUN: 25.5 mg/dL (ref 7.0–26.0)
Calcium: 9.9 mg/dL (ref 8.4–10.4)
Creatinine: 1.2 mg/dL (ref 0.7–1.3)
Glucose: 98 mg/dl (ref 70–140)
Potassium: 4.2 mEq/L (ref 3.5–5.1)
Total Protein: 7.5 g/dL (ref 6.4–8.3)

## 2012-12-31 NOTE — Patient Instructions (Signed)
Referral to radiation oncology for palliative radiotherapy. Followup visit in 4 weeks.

## 2012-12-31 NOTE — Telephone Encounter (Signed)
Gave pt appt for lab and MD for December and Radiation on 01/02/13

## 2012-12-31 NOTE — Progress Notes (Signed)
[facility]  [phone] [phone]   OFFICE PROGRESS NOTE    Pittman, Justin Curt, MD [address] [place] [address]  DIAGNOSIS: Recurrent non-small cell lung cancer initially diagnosed as stage IIIB non-small cell lung cancer, squamous cell carcinoma with right lower lobe lung mass as well as left hilar lymphadenopathy diagnosed in August of 2013.   PRIOR THERAPY: Concurrent chemoradiation with weekly carboplatin for AUC of 2 and paclitaxel 45 mg/M2, last dose of chemotherapy was given 12/05/2011 with partial response.   CURRENT THERAPY: Systemic chemotherapy with carboplatin for AUC of 5 on day 1 and gemcitabine 1000 mg/M2 on days 1 and 8 every 3 weeks. First dose expected on 12/01/2012.   CHEMOTHERAPY INTENT: Palliative  CURRENT # OF CHEMOTHERAPY CYCLES:1  CURRENT ANTIEMETICS: Zofran, dexamethasone and Compazine  CURRENT SMOKING STATUS: Former smoker  ORAL CHEMOTHERAPY AND CONSENT: None  CURRENT BISPHOSPHONATES USE: None   PAIN MANAGEMENT: 5/10 currently on Norco. NARCOTICS INDUCED CONSTIPATION: None  LIVING WILL AND CODE STATUS: ?   INTERVAL HISTORY:  Justin Pittman 79 y.o. male returns to the clinic today for followup visit accompanied his wife. The patient continues to complain of increasing fatigue and weakness as well as lack of appetite and pain on the right lower rib cage. His pain is currently controlled with Norco 10/325 mg by mouth every 6 hours. He was recently admitted to [facility] complaining of weakness and lethargy. He was diagnosed with healthcare associated pneumonia and treated with Zosyn and IV vancomycin. He was also found to have dehydration and hyponatremia. He had a rough time with the first cycle of the chemotherapy including pancytopenia. The patient is feeling much better today and he has improvement in his total white blood count, hemoglobin as well as platelets count. He denied having any significant  shortness of breath except with exertion, no cough or hemoptysis. He lost few pounds since his last visit. The patient denied having any fever or chills. He is here for evaluation and discussion of his treatment options.  MEDICAL HISTORY: Past Medical History  Diagnosis Date  . HTN (hypertension)   . Lung cancer     Squamous Cell  . Vertigo 2010    Hospitalized a [facility]  . Hyperlipidemia   . Lung cancer   . Loss of weight   . Pyoderma 03/12/2012  . Contact dermatitis and other eczema due to other specified agent   . Tachycardia, unspecified 01/17/2012  . Candidiasis of mouth 01/01/2012  . Insomnia, unspecified 01/01/2012  . Malignant neoplasm of bronchus and lung, unspecified site 12/29/2011  . Hypopotassemia 12/29/2011  . Other pancytopenia 12/29/2011  . Anemia of other chronic disease 12/29/2011  . Pneumonitis due to inhalation of food or vomitus 12/29/2011  . Other esophagitis 12/29/2011    ALLERGIES:  has No Known Allergies.  MEDICATIONS:  Current Outpatient Prescriptions  Medication Sig Dispense Refill  . ENSURE (ENSURE) Take 237 mLs by mouth daily.       Marland Kitchen HYDROcodone-acetaminophen (NORCO) 10-325 MG per tablet Take 1 tablet by mouth every 6 (six) hours as needed for pain.      . mirtazapine (REMERON) 30 MG tablet Take 30 mg by mouth at bedtime.      . pantoprazole (PROTONIX) 40 MG tablet Take 1 tablet (40 mg total) by mouth 2 (two) times daily. To  reduce stomach acid  180 tablet  1  . prochlorperazine (COMPAZINE) 10 MG tablet Take 10 mg by mouth every 6 (six)  hours as needed.      . tamsulosin (FLOMAX) 0.4 MG CAPS capsule Take 0.4 mg by mouth daily after supper.       No current facility-administered medications for this visit.    SURGICAL HISTORY:  Past Surgical History  Procedure Laterality Date  . Tonsillectomy    . Video bronchoscopy  09/20/2011    Procedure: VIDEO BRONCHOSCOPY WITHOUT FLUORO;  Surgeon: Nyoka Cowden, MD;  Location: [facility];  Service:  Cardiopulmonary;  Laterality: Bilateral;  . Tonsillectomy      REVIEW OF SYSTEMS:  Constitutional: positive for anorexia, fatigue and weight loss Eyes: negative Ears, nose, mouth, throat, and face: negative Respiratory: positive for dyspnea on exertion and pleurisy/chest pain Cardiovascular: negative Gastrointestinal: negative Genitourinary:negative Integument/breast: negative Hematologic/lymphatic: negative Musculoskeletal:negative Neurological: negative Behavioral/Psych: negative Endocrine: negative Allergic/Immunologic: negative   PHYSICAL EXAMINATION: General appearance: alert, cooperative, fatigued and no distress Head: Normocephalic, without obvious abnormality, atraumatic Neck: no adenopathy, no JVD, supple, symmetrical, trachea midline and thyroid not enlarged, symmetric, no tenderness/mass/nodules Lymph nodes: Cervical, supraclavicular, and axillary nodes normal. Resp: clear to auscultation bilaterally Back: symmetric, no curvature. ROM normal. No CVA tenderness. Cardio: regular rate and rhythm, S1, S2 normal, no murmur, click, rub or gallop GI: soft, non-tender; bowel sounds normal; no masses,  no organomegaly Extremities: extremities normal, atraumatic, no cyanosis or edema Neurologic: Alert and oriented X 3, normal strength and tone. Normal symmetric reflexes. Normal coordination and gait  ECOG PERFORMANCE STATUS: 2 - Symptomatic, <50% confined to bed  Blood pressure 125/77, pulse 115, temperature 96.8 F (36 C), temperature source Oral, resp. rate 16, height 5\' 10"  (1.778 m), weight 131 lb 6.4 oz (59.603 kg).  LABORATORY DATA: Lab Results  Component Value Date   WBC 7.6 12/31/2012   HGB 9.7* 12/31/2012   HCT 30.1* 12/31/2012   MCV 82.4 12/31/2012   PLT 410* 12/31/2012      Chemistry      Component Value Date/Time   NA 141 12/31/2012 1449   NA 136 12/19/2012 0410   NA 142 09/19/2012 0827   K 4.2 12/31/2012 1449   K 4.0 12/19/2012 0410   CL 103 12/19/2012 0410     CL 105 08/01/2012 0945   CO2 26 12/31/2012 1449   CO2 25 12/19/2012 0410   BUN 25.5 12/31/2012 1449   BUN 12 12/19/2012 0410   BUN 24 09/19/2012 0827   CREATININE 1.2 12/31/2012 1449   CREATININE 1.16 12/19/2012 0410      Component Value Date/Time   CALCIUM 9.9 12/31/2012 1449   CALCIUM 8.7 12/19/2012 0410   ALKPHOS 128 12/31/2012 1449   ALKPHOS 125* 12/19/2012 0410   AST 14 12/31/2012 1449   AST 15 12/19/2012 0410   ALT 7 12/31/2012 1449   ALT 11 12/19/2012 0410   BILITOT 0.35 12/31/2012 1449   BILITOT 0.4 12/19/2012 0410       ASSESSMENT AND PLAN: This is a very pleasant 79 years old white male with recurrent non-small cell lung cancer, squamous cell carcinoma started recently on systemic chemotherapy with carboplatin and gemcitabine status post 1 cycle. His treatment was complicated with significant weakness and fatigue as well as pancytopenia. I do think the patient would be a good candidate to continue his chemotherapy with carboplatin and gemcitabine.  I discussed with the patient several options for his treatment including palliative radiotherapy to the right lower lobe lung nodule especially with his persistent pain.  I also discussed with the patient systemic treatment options including enrollment in  the immunotherapy clinical trial with Nivolumab versus treatment with oral Tarceva. I discussed with the patient adverse effect of this treatment especially diarrhea and skin rash in addition to interstitial lung disease, liver or renal dysfunction. The patient will think about his treatment options after completion of the radiotherapy. I referred him back to Dr. Kathrynn Running or one of his associates for consideration of palliative radiotherapy to the right lower lobe lung nodule. I would see him back for followup visit in 4 weeks for reevaluation. He was advised to call immediately if he has any concerning symptoms in the interval. The patient voices understanding of current disease status  and treatment options and is in agreement with the current care plan.  All questions were answered. The patient knows to call the clinic with any problems, questions or concerns. We can certainly see the patient much sooner if necessary.  I spent 20 minutes counseling the patient face to face. The total time spent in the appointment was 30 minutes.

## 2013-01-01 ENCOUNTER — Ambulatory Visit
Admission: RE | Admit: 2013-01-01 | Discharge: 2013-01-01 | Disposition: A | Discharge status: Home / Self Care | Payer: Medicare Other | Source: Ambulatory Visit | Attending: Radiation Oncology | Admitting: Radiation Oncology

## 2013-01-01 ENCOUNTER — Ambulatory Visit: Payer: Medicare Other

## 2013-01-01 ENCOUNTER — Telehealth: Payer: Self-pay | Admitting: Internal Medicine

## 2013-01-01 NOTE — Telephone Encounter (Signed)
Faxed pt medical records to Dr. Brendia Sacks @ [facility].  Slides and scans will be fedex'ed

## 2013-01-01 NOTE — Telephone Encounter (Signed)
s.w. Mrs Justin Pittman adn advised that pt wanted to be with Dr. Kathrynn Running.Marland Kitchen..Mrs. Justin Pittman already aware and pt is sched to come in this afternoon @ 1pm..Marland Kitchen

## 2013-01-01 NOTE — Progress Notes (Signed)
Thoracic Location of Tumor / Histology:Right lower lob Lung cancer -Recurrent dx August 29013  Patient presented  With increasing fatigue weakness,loss appetite and pain right lower rib cage ,recent hospitalization with pneumonia  Biopsies of  RLL lung 10/11/11 =Squamous cell  Stage III nscca as well as left hilar lymphadenopathy  Tobacco/Marijuana/Snuff/ETOH use: quit >23 years ago   Past/Anticipated interventions by cardiothoracic surgery, if any: Video bronchoscopy without fluoro  09/20/11  Past/Anticipated interventions by medical oncology, if any: 12/31/12 note:  PRIOR THERAPY: Concurrent chemoradiation with weekly carboplatin for AUC of 2 and paclitaxel 45 mg/M2, last dose of chemotherapy was given 12/05/2011 with partial response.  CURRENT THERAPY: Systemic chemotherapy with carboplatin for AUC of 5 on day 1 and gemcitabine 1000 mg/M2 on days 1 and 8 every 3 weeks. First dose expected on 12/01/2012   Signs/Symptoms  Weight changes, if any: 10 lbs  In past 3-4 weeks  Respiratory complaints, if any: sob with exertion   Hemoptysis, if any: no  Pain issues, if any: right rib area   SAFETY ISSUES:  Prior radiation? Yes, 10/31/11-12/08/11 56Gy/28 fx of 2 Gy,Right lung  With Dr.Matthew Kathrynn Running using Tomotherapy machine with image guidance  Pacemaker/ICD? NO  Is the patient on methotrexate? No  Current Complaints / other details= FTT severe protein-calorie  malnutrition

## 2013-01-02 ENCOUNTER — Encounter: Payer: Self-pay | Admitting: *Deleted

## 2013-01-02 ENCOUNTER — Telehealth: Payer: Self-pay | Admitting: *Deleted

## 2013-01-02 ENCOUNTER — Ambulatory Visit
Admission: RE | Admit: 2013-01-02 | Discharge: 2013-01-02 | Disposition: A | Discharge status: Home / Self Care | Payer: Medicare Other | Source: Ambulatory Visit | Attending: Radiation Oncology | Admitting: Radiation Oncology

## 2013-01-02 ENCOUNTER — Encounter: Payer: Self-pay | Admitting: Radiation Oncology

## 2013-01-02 ENCOUNTER — Ambulatory Visit: Payer: Medicare Other

## 2013-01-02 ENCOUNTER — Ambulatory Visit: Payer: Medicare Other | Admitting: Physical Therapy

## 2013-01-02 ENCOUNTER — Ambulatory Visit: Admission: RE | Admit: 2013-01-02 | Payer: Medicare Other | Source: Ambulatory Visit | Admitting: Radiation Oncology

## 2013-01-02 ENCOUNTER — Institutional Professional Consult (permissible substitution): Payer: Medicare Other | Admitting: Radiation Oncology

## 2013-01-02 DIAGNOSIS — G8929 Other chronic pain: Secondary | ICD-10-CM

## 2013-01-02 NOTE — Progress Notes (Signed)
Radiation Oncology         [phone] ________________________________  Multidisciplinary Thoracic Oncology Clinic [facility]) Initial Outpatient Consultation  Name: Justin Pittman MRN: [id number]  Date: 01/02/2013  DOB: 28-Jul-1932  WU:[id number], Justin Curt, MD  Si Gaul, MD   REFERRING PHYSICIAN: Si Gaul, MD  DIAGNOSIS: 79 year old gentleman with progressive stage IV non-small cell lung cancer  HISTORY OF PRESENT ILLNESS::Justin Pittman is a 79 y.o. male with Recurrent non-small cell lung cancer initially diagnosed as stage IIIB non-small cell lung cancer, squamous cell carcinoma with right lower lobe lung mass with left hilar lymphadenopathy diagnosed in August of 2013. I assisted in treating him at that time with concurrent chemoradiation with partial response. More recently, he has been receiving Systemic chemotherapy with carboplatin for AUC of 5 on day 1 and gemcitabine 1000 mg/M2.  He has experienced progression of his right lower lung changes and increasing pain in the right lower chest and right upper quadrant. He is currently been referred today for discussion of potential palliative radiation treatment options.  PREVIOUS RADIATION THERAPY: Yes as above  PAST MEDICAL HISTORY:  has a past medical history of HTN (hypertension); Lung cancer; Vertigo (2010); Hyperlipidemia; Lung cancer; Loss of weight; Pyoderma (03/12/2012); Contact dermatitis and other eczema due to other specified agent; Tachycardia, unspecified (01/17/2012); Candidiasis of mouth (01/01/2012); Insomnia, unspecified (01/01/2012); Malignant neoplasm of bronchus and lung, unspecified site (12/29/2011); Hypopotassemia (12/29/2011); Other pancytopenia (12/29/2011); Anemia of other chronic disease (12/29/2011); Pneumonitis due to inhalation of food or vomitus (12/29/2011); and Other esophagitis (12/29/2011).    PAST SURGICAL HISTORY: Past Surgical History  Procedure Laterality Date  . Tonsillectomy    . Video  bronchoscopy  09/20/2011    Procedure: VIDEO BRONCHOSCOPY WITHOUT FLUORO;  Surgeon: Nyoka Cowden, MD;  Location: [facility];  Service: Cardiopulmonary;  Laterality: Bilateral;  . Tonsillectomy      FAMILY HISTORY: family history includes Cancer in his brother.  SOCIAL HISTORY:  reports that he quit smoking about 19 years ago. His smoking use included Cigarettes. He has a 40 pack-year smoking history. He has never used smokeless tobacco. He reports that he does not drink alcohol or use illicit drugs.  ALLERGIES: Review of patient's allergies indicates no known allergies.  MEDICATIONS:  Current Outpatient Prescriptions  Medication Sig Dispense Refill  . ENSURE (ENSURE) Take 237 mLs by mouth daily.       Marland Kitchen HYDROcodone-acetaminophen (NORCO) 10-325 MG per tablet Take 1 tablet by mouth every 6 (six) hours as needed for pain.      . mirtazapine (REMERON) 30 MG tablet Take 30 mg by mouth at bedtime.      . pantoprazole (PROTONIX) 40 MG tablet Take 1 tablet (40 mg total) by mouth 2 (two) times daily. To  reduce stomach acid  180 tablet  1  . prochlorperazine (COMPAZINE) 10 MG tablet Take 10 mg by mouth every 6 (six) hours as needed.      . tamsulosin (FLOMAX) 0.4 MG CAPS capsule Take 0.4 mg by mouth daily after supper.       No current facility-administered medications for this encounter.    REVIEW OF SYSTEMS:  A 15 point review of systems is documented in the electronic medical record. This was obtained by the nursing staff. However, I reviewed this with the patient to discuss relevant findings and make appropriate changes.  Pertinent items are noted in HPI.   PHYSICAL EXAM:  The patient is in NAD.  Vitals WNL.  Resp unremarkable.  he localizes pain  ot the right costal margin, which is non-tender.  KPS = 80  LABORATORY DATA:  Lab Results  Component Value Date   WBC 7.6 12/31/2012   HGB 9.7* 12/31/2012   HCT 30.1* 12/31/2012   MCV 82.4 12/31/2012   PLT 410* 12/31/2012   Lab Results    Component Value Date   NA 141 12/31/2012   K 4.2 12/31/2012   CL 103 12/19/2012   CO2 26 12/31/2012   Lab Results  Component Value Date   ALT 7 12/31/2012   AST 14 12/31/2012   ALKPHOS 128 12/31/2012   BILITOT 0.35 12/31/2012    PULMONARY FUNCTION TEST:  N/A   RADIOGRAPHY: Dg Chest 2 View  12/18/2012   CLINICAL DATA:  History of lung carcinoma, weakness  EXAM: CHEST  2 VIEW  COMPARISON:  05/28/2012  FINDINGS: Cardiac shadow is stable. The left lung is well aerated. There is volume loss in the right lung consistent with the patient's known history an likely post obstructive change. No other focal abnormality is noted.  IMPRESSION: Increasing right hilar disease with post obstructive changes in the medial aspect of the right lower lobe.   Electronically Signed   By: Alcide Clever M.D.   On: 12/18/2012 12:49      IMPRESSION: The patient is a very nice 79 year old gentleman with progressive non-small cell carcinoma lung. He may benefit from palliative radiotherapy directed at the nodules in the right hemithorax.  PLAN: Today, I talked to the patient and family about the findings and work-up thus far.  We discussed the natural history of disease and general treatment, highlighting the role or radiotherapy in the management.  We discussed the available radiation techniques, and focused on the details of logistics and delivery.  We reviewed the anticipated acute and late sequelae associated with radiation in this setting.  The patient was encouraged to ask questions that I answered to the best of my ability. He wants to take a few days to think about radiation and will call me Monday to discuss.  If he decides to do radiation, he will be scheduled for CT simulation.  I spent 60 minutes minutes face to face with the patient and more than 50% of that time was spent in counseling and/or coordination of care.    ------------------------------------------------  Artist Pais. Kathrynn Running, M.D.

## 2013-01-02 NOTE — Telephone Encounter (Signed)
Called pt on cell phone.  He just arrived at cancer center

## 2013-01-08 ENCOUNTER — Ambulatory Visit: Payer: Medicare Other | Admitting: Radiation Oncology

## 2013-01-08 ENCOUNTER — Ambulatory Visit: Payer: Medicare Other

## 2013-01-13 ENCOUNTER — Telehealth: Payer: Self-pay | Admitting: *Deleted

## 2013-01-13 NOTE — Telephone Encounter (Signed)
Patient has an appointment on Wednesday with Dr. Chilton Si. But patient has been sleeping a lot (all the time, day and night) Only waking enough to eat alittle bit. Wife is concerned and wants to know what to do. Please advise.    **printed and given to Wyatt Portela to respond

## 2013-01-14 NOTE — Telephone Encounter (Signed)
Patient Wife  Notified

## 2013-01-14 NOTE — Telephone Encounter (Signed)
Please try to come to the office as planned.

## 2013-01-15 ENCOUNTER — Encounter: Payer: Self-pay | Admitting: Internal Medicine

## 2013-01-15 ENCOUNTER — Ambulatory Visit (INDEPENDENT_AMBULATORY_CARE_PROVIDER_SITE_OTHER): Payer: Medicare Other | Admitting: Internal Medicine

## 2013-01-15 VITALS — BP 110/62 | HR 61 | Temp 97.5°F | Wt 131.6 lb

## 2013-01-15 DIAGNOSIS — I10 Essential (primary) hypertension: Secondary | ICD-10-CM

## 2013-01-15 DIAGNOSIS — C3491 Malignant neoplasm of unspecified part of right bronchus or lung: Secondary | ICD-10-CM

## 2013-01-15 DIAGNOSIS — R627 Adult failure to thrive: Secondary | ICD-10-CM

## 2013-01-15 DIAGNOSIS — G8929 Other chronic pain: Secondary | ICD-10-CM

## 2013-01-15 DIAGNOSIS — R630 Anorexia: Secondary | ICD-10-CM

## 2013-01-15 DIAGNOSIS — E43 Unspecified severe protein-calorie malnutrition: Secondary | ICD-10-CM

## 2013-01-15 DIAGNOSIS — R1011 Right upper quadrant pain: Secondary | ICD-10-CM

## 2013-01-15 DIAGNOSIS — C3490 Malignant neoplasm of unspecified part of unspecified bronchus or lung: Secondary | ICD-10-CM

## 2013-01-15 MED ORDER — MIRTAZAPINE 30 MG PO TABS: ORAL_TABLET | ORAL | Status: AC

## 2013-01-15 NOTE — Progress Notes (Signed)
Subjective:    Patient ID: Justin Pittman, male    DOB: Dec 02, 1932, 80 y.o.   MRN: [id number]  Chief Complaint  Patient presents with  . Medical Managment of Chronic Issues    4 month f/u  . Immunizations    declines Flu vaccine and has questions regarding getting the Tdap  . other    sleeping alot for 1 week    HPI Weight has been stable since last seen.  Continues with right lower chest and upper abd pain. Gets drowsy on pain medications and Compazine. No appetite. Refusing to get up and walk. Poor balance. Fell today, but did not injure himself.  Radiation of the right lung has been proposed. He is inclined to try it, Pt and family realize it is palliative and directed to pain relief only. His wife is concerned that it may further weaken him and will not be able to tolerate it.  Current Outpatient Prescriptions on File Prior to Visit  Medication Sig Dispense Refill  . ENSURE (ENSURE) Take 237 mLs by mouth daily.       Marland Kitchen HYDROcodone-acetaminophen (NORCO) 10-325 MG per tablet Take 1 tablet by mouth every 6 (six) hours as needed for pain.      . mirtazapine (REMERON) 30 MG tablet Take 30 mg by mouth at bedtime.      . pantoprazole (PROTONIX) 40 MG tablet Take 1 tablet (40 mg total) by mouth 2 (two) times daily. To  reduce stomach acid  180 tablet  1  . prochlorperazine (COMPAZINE) 10 MG tablet Take 10 mg by mouth every 6 (six) hours as needed.      . tamsulosin (FLOMAX) 0.4 MG CAPS capsule Take 0.4 mg by mouth daily after supper.       No current facility-administered medications on file prior to visit.    Review of Systems  Constitutional: Positive for fatigue. Negative for fever, chills, diaphoresis, activity change and appetite change.  HENT: Negative.   Eyes: Negative.   Respiratory: Positive for shortness of breath. Negative for cough and wheezing.        Hx lung cancer. Pain in the right upper abdomen and lower chest.  Cardiovascular: Negative.   Gastrointestinal:  Positive for nausea, vomiting and abdominal pain (diffuse mild upper abd discomfort).  Endocrine: Negative.   Genitourinary: Negative.   Musculoskeletal: Negative.   Skin: Positive for pallor.       Prior area of proud flesh at site of PEG tube insertion has healed.  Allergic/Immunologic: Negative.   Neurological: Negative.   Hematological: Negative.   Psychiatric/Behavioral: Negative.        Objective:BP 110/62  Pulse 61  Temp(Src) 97.5 F (36.4 C) (Oral)  Wt 131 lb 9.6 oz (59.693 kg)  SpO2 95%    Physical Exam  Constitutional: He is oriented to person, place, and time. He appears distressed.  Thin. Generalized weakness.  HENT:  Head: Normocephalic and atraumatic.  Right Ear: External ear normal.  Left Ear: External ear normal.  Nose: Nose normal.  Mouth/Throat: Oropharynx is clear and moist.  Eyes: Conjunctivae and EOM are normal. Pupils are equal, round, and reactive to light. Right eye exhibits no discharge. Left eye exhibits no discharge.  Neck: Normal range of motion. Neck supple. No JVD present. No tracheal deviation present. No thyromegaly present.  Cardiovascular: Normal rate, regular rhythm and intact distal pulses.  Exam reveals gallop. Exam reveals no friction rub.   No murmur heard. Pulmonary/Chest: Effort normal and breath sounds normal. No respiratory  distress. He has no wheezes. He has no rales. He exhibits no tenderness.  Abdominal: Soft. Bowel sounds are normal. He exhibits no distension and no mass. There is tenderness.  Musculoskeletal: Normal range of motion. He exhibits no edema and no tenderness.  Lymphadenopathy:    He has no cervical adenopathy.  Neurological: He is alert and oriented to person, place, and time. He has normal reflexes. No cranial nerve deficit. Coordination normal.  Skin: Skin is warm and dry. No rash noted. He is not diaphoretic. No erythema. There is pallor.  Psychiatric: He has a normal mood and affect. His behavior is normal.  Judgment and thought content normal.   Appointment on 12/31/2012  Component Date Value Range Status  . WBC 12/31/2012 7.6  4.0 - 10.3 10e3/uL Final  . NEUT# 12/31/2012 5.8  1.5 - 6.5 10e3/uL Final  . HGB 12/31/2012 9.7* 13.0 - 17.1 g/dL Final  . HCT 16/11/9602 30.1* 38.4 - 49.9 % Final  . Platelets 12/31/2012 410* 140 - 400 10e3/uL Final  . MCV 12/31/2012 82.4  79.3 - 98.0 fL Final  . MCH 12/31/2012 26.7* 27.2 - 33.4 pg Final  . MCHC 12/31/2012 32.4  32.0 - 36.0 g/dL Final  . RBC 54/10/8117 3.65* 4.20 - 5.82 10e6/uL Final  . RDW 12/31/2012 15.2* 11.0 - 14.6 % Final  . lymph# 12/31/2012 0.6* 0.9 - 3.3 10e3/uL Final  . MONO# 12/31/2012 1.0* 0.1 - 0.9 10e3/uL Final  . Eosinophils Absolute 12/31/2012 0.0  0.0 - 0.5 10e3/uL Final  . Basophils Absolute 12/31/2012 0.0  0.0 - 0.1 10e3/uL Final  . NEUT% 12/31/2012 77.3* 39.0 - 75.0 % Final  . LYMPH% 12/31/2012 8.1* 14.0 - 49.0 % Final  . MONO% 12/31/2012 13.7  0.0 - 14.0 % Final  . EOS% 12/31/2012 0.3  0.0 - 7.0 % Final  . BASO% 12/31/2012 0.6  0.0 - 2.0 % Final  . Sodium 12/31/2012 141  136 - 145 mEq/L Final  . Potassium 12/31/2012 4.2  3.5 - 5.1 mEq/L Final  . Chloride 12/31/2012 99  98 - 109 mEq/L Final  . CO2 12/31/2012 26  22 - 29 mEq/L Final  . Glucose 12/31/2012 98  70 - 140 mg/dl Final  . BUN 14/78/2956 25.5  7.0 - 26.0 mg/dL Final  . Creatinine 21/30/8657 1.2  0.7 - 1.3 mg/dL Final  . Total Bilirubin 12/31/2012 0.35  0.20 - 1.20 mg/dL Final  . Alkaline Phosphatase 12/31/2012 128  40 - 150 U/L Final  . AST 12/31/2012 14  5 - 34 U/L Final  . ALT 12/31/2012 7  0 - 55 U/L Final  . Total Protein 12/31/2012 7.5  6.4 - 8.3 g/dL Final  . Albumin 84/69/6295 3.3* 3.5 - 5.0 g/dL Final  . Calcium 28/41/3244 9.9  8.4 - 10.4 mg/dL Final  . Anion Gap 03/01/7251 16* 3 - 11 mEq/L Final  Admission on 12/18/2012, Discharged on 12/21/2012  Component Date Value Range Status  . WBC 12/18/2012 0.8* 4.0 - 10.5 K/uL Final   Comment: REPEATED TO  VERIFY                          CRITICAL RESULT CALLED TO, READ BACK BY AND VERIFIED WITH:                          C. HALL RN AT 1215 ON 10.22.14 BY SHUEA  . RBC 12/18/2012 3.07* 4.22 - 5.81 MIL/uL  Final  . Hemoglobin 12/18/2012 8.4* 13.0 - 17.0 g/dL Final  . HCT 09/81/1914 24.4* 39.0 - 52.0 % Final  . MCV 12/18/2012 79.5  78.0 - 100.0 fL Final  . MCH 12/18/2012 27.4  26.0 - 34.0 pg Final  . MCHC 12/18/2012 34.4  30.0 - 36.0 g/dL Final  . RDW 78/29/5621 12.4  11.5 - 15.5 % Final  . Platelets 12/18/2012 138* 150 - 400 K/uL Final  . Neutrophils Relative % 12/18/2012 16* 43 - 77 % Final  . Lymphocytes Relative 12/18/2012 33  12 - 46 % Final  . Monocytes Relative 12/18/2012 50* 3 - 12 % Final  . Eosinophils Relative 12/18/2012 1  0 - 5 % Final  . Basophils Relative 12/18/2012 0  0 - 1 % Final  . Neutro Abs 12/18/2012 0.1* 1.7 - 7.7 K/uL Final  . Lymphs Abs 12/18/2012 0.3* 0.7 - 4.0 K/uL Final  . Monocytes Absolute 12/18/2012 0.4  0.1 - 1.0 K/uL Final  . Eosinophils Absolute 12/18/2012 0.0  0.0 - 0.7 K/uL Final  . Basophils Absolute 12/18/2012 0.0  0.0 - 0.1 K/uL Final  . WBC Morphology 12/18/2012 WHITE COUNT CONFIRMED ON SMEAR   Final  . Sodium 12/18/2012 132* 135 - 145 mEq/L Final  . Potassium 12/18/2012 3.9  3.5 - 5.1 mEq/L Final  . Chloride 12/18/2012 97  96 - 112 mEq/L Final  . CO2 12/18/2012 26  19 - 32 mEq/L Final  . Glucose, Bld 12/18/2012 95  70 - 99 mg/dL Final  . BUN 30/86/5784 16  6 - 23 mg/dL Final  . Creatinine, Ser 12/18/2012 1.20  0.50 - 1.35 mg/dL Final  . Calcium 69/62/9528 9.4  8.4 - 10.5 mg/dL Final  . Total Protein 12/18/2012 6.9  6.0 - 8.3 g/dL Final  . Albumin 41/32/4401 3.4* 3.5 - 5.2 g/dL Final  . AST 02/72/5366 21  0 - 37 U/L Final  . ALT 12/18/2012 14  0 - 53 U/L Final  . Alkaline Phosphatase 12/18/2012 142* 39 - 117 U/L Final  . Total Bilirubin 12/18/2012 0.3  0.3 - 1.2 mg/dL Final  . GFR calc non Af Amer 12/18/2012 56* >90 mL/min Final  . GFR calc Af Amer  12/18/2012 65* >90 mL/min Final   Comment: (NOTE)                          The eGFR has been calculated using the CKD EPI equation.                          This calculation has not been validated in all clinical situations.                          eGFR's persistently <90 mL/min signify possible Chronic Kidney                          Disease.  . Lipase 12/18/2012 19  11 - 59 U/L Final  . Color, Urine 12/18/2012 YELLOW  YELLOW Final  . APPearance 12/18/2012 CLOUDY* CLEAR Final  . Specific Gravity, Urine 12/18/2012 1.022  1.005 - 1.030 Final  . pH 12/18/2012 6.5  5.0 - 8.0 Final  . Glucose, UA 12/18/2012 NEGATIVE  NEGATIVE mg/dL Final  . Hgb urine dipstick 12/18/2012 NEGATIVE  NEGATIVE Final  . Bilirubin Urine 12/18/2012 NEGATIVE  NEGATIVE Final  . Ketones, ur  12/18/2012 NEGATIVE  NEGATIVE mg/dL Final  . Protein, ur 45/40/9811 NEGATIVE  NEGATIVE mg/dL Final  . Urobilinogen, UA 12/18/2012 0.2  0.0 - 1.0 mg/dL Final  . Nitrite 91/47/8295 NEGATIVE  NEGATIVE Final  . Leukocytes, UA 12/18/2012 NEGATIVE  NEGATIVE Final   MICROSCOPIC NOT DONE ON URINES WITH NEGATIVE PROTEIN, BLOOD, LEUKOCYTES, NITRITE, OR GLUCOSE <1000 mg/dL.  Marland Kitchen Specimen Description 12/18/2012 BLOOD LEFT HAND   Final  . Special Requests 12/18/2012 BOTTLES DRAWN AEROBIC ONLY 3CC   Final  . Culture  Setup Time 12/18/2012    Final                   Value:12/18/2012 22:58                         Performed at [facility]  . Culture 12/18/2012    Final                   Value:NO GROWTH 5 DAYS                         Performed at [facility]  . Report Status 12/18/2012 12/24/2012 FINAL   Final  . Specimen Description 12/18/2012 BLOOD RIGHT ARM   Final  . Special Requests 12/18/2012 BOTTLES DRAWN AEROBIC AND ANAEROBIC 5CC   Final  . Culture  Setup Time 12/18/2012    Final                   Value:12/18/2012 22:58                         Performed at [facility]  . Culture 12/18/2012    Final                    Value:NO GROWTH 5 DAYS                         Performed at [facility]  . Report Status 12/18/2012 12/24/2012 FINAL   Final  . Specimen Description 12/18/2012 URINE, CLEAN CATCH   Final  . Special Requests 12/18/2012 NONE   Final  . Legionella Antigen, Urine 12/18/2012    Final                   Value:Negative for Legionella pneumophilia serogroup 1                         Performed at [facility]  . Report Status 12/18/2012 12/19/2012 FINAL   Final  . Strep Pneumo Urinary Antigen 12/18/2012 NEGATIVE  NEGATIVE Final   Comment:                                 Infection due to S. pneumoniae                          cannot be absolutely ruled out                          since the antigen present                          may be  below the detection limit                          of the test.                          Performed at [facility]  . WBC 12/19/2012 1.2* 4.0 - 10.5 K/uL Final   Comment: REPEATED TO VERIFY                          CRITICAL VALUE NOTED.  VALUE IS CONSISTENT WITH PREVIOUSLY REPORTED AND CALLED VALUE.  Marland Kitchen RBC 12/19/2012 2.89* 4.22 - 5.81 MIL/uL Final  . Hemoglobin 12/19/2012 8.0* 13.0 - 17.0 g/dL Final  . HCT 16/11/9602 23.0* 39.0 - 52.0 % Final  . MCV 12/19/2012 79.6  78.0 - 100.0 fL Final  . MCH 12/19/2012 27.7  26.0 - 34.0 pg Final  . MCHC 12/19/2012 34.8  30.0 - 36.0 g/dL Final  . RDW 54/10/8117 12.6  11.5 - 15.5 % Final  . Platelets 12/19/2012 139* 150 - 400 K/uL Final  . Sodium 12/19/2012 136  135 - 145 mEq/L Final  . Potassium 12/19/2012 4.0  3.5 - 5.1 mEq/L Final  . Chloride 12/19/2012 103  96 - 112 mEq/L Final  . CO2 12/19/2012 25  19 - 32 mEq/L Final  . Glucose, Bld 12/19/2012 94  70 - 99 mg/dL Final  . BUN 14/78/2956 12  6 - 23 mg/dL Final  . Creatinine, Ser 12/19/2012 1.16  0.50 - 1.35 mg/dL Final  . Calcium 21/30/8657 8.7  8.4 - 10.5 mg/dL Final  . Total Protein 12/19/2012 5.9* 6.0 - 8.3 g/dL Final  . Albumin  84/69/6295 2.9* 3.5 - 5.2 g/dL Final  . AST 28/41/3244 15  0 - 37 U/L Final  . ALT 12/19/2012 11  0 - 53 U/L Final  . Alkaline Phosphatase 12/19/2012 125* 39 - 117 U/L Final  . Total Bilirubin 12/19/2012 0.4  0.3 - 1.2 mg/dL Final  . GFR calc non Af Amer 12/19/2012 58* >90 mL/min Final  . GFR calc Af Amer 12/19/2012 68* >90 mL/min Final   Comment: (NOTE)                          The eGFR has been calculated using the CKD EPI equation.                          This calculation has not been validated in all clinical situations.                          eGFR's persistently <90 mL/min signify possible Chronic Kidney                          Disease.  . WBC 12/20/2012 2.7* 4.0 - 10.5 K/uL Final  . RBC 12/20/2012 2.98* 4.22 - 5.81 MIL/uL Final  . Hemoglobin 12/20/2012 8.3* 13.0 - 17.0 g/dL Final  . HCT 03/01/7251 24.1* 39.0 - 52.0 % Final  . MCV 12/20/2012 80.9  78.0 - 100.0 fL Final  . MCH 12/20/2012 27.9  26.0 - 34.0 pg Final  . MCHC 12/20/2012 34.4  30.0 - 36.0 g/dL Final  . RDW 66/44/0347 12.8  11.5 - 15.5 % Final  . Platelets  12/20/2012 170  150 - 400 K/uL Final  . Neutrophils Relative % 12/20/2012 43  43 - 77 % Final  . Lymphocytes Relative 12/20/2012 17  12 - 46 % Final  . Monocytes Relative 12/20/2012 39* 3 - 12 % Final  . Eosinophils Relative 12/20/2012 1  0 - 5 % Final  . Basophils Relative 12/20/2012 0  0 - 1 % Final  . Neutro Abs 12/20/2012 1.1* 1.7 - 7.7 K/uL Final  . Lymphs Abs 12/20/2012 0.5* 0.7 - 4.0 K/uL Final  . Monocytes Absolute 12/20/2012 1.1* 0.1 - 1.0 K/uL Final  . Eosinophils Absolute 12/20/2012 0.0  0.0 - 0.7 K/uL Final  . Basophils Absolute 12/20/2012 0.0  0.0 - 0.1 K/uL Final  . WBC Morphology 12/20/2012 MILD LEFT SHIFT (1-5% METAS, OCC MYELO, OCC BANDS)   Final   DOHLE BODIES  . Vancomycin Tr 12/20/2012 11.9  10.0 - 20.0 ug/mL Final  . WBC 12/21/2012 4.8  4.0 - 10.5 K/uL Final  . RBC 12/21/2012 3.11* 4.22 - 5.81 MIL/uL Final  . Hemoglobin 12/21/2012 8.5*  13.0 - 17.0 g/dL Final  . HCT 16/11/9602 24.7* 39.0 - 52.0 % Final  . MCV 12/21/2012 79.4  78.0 - 100.0 fL Final  . MCH 12/21/2012 27.3  26.0 - 34.0 pg Final  . MCHC 12/21/2012 34.4  30.0 - 36.0 g/dL Final  . RDW 54/10/8117 12.9  11.5 - 15.5 % Final  . Platelets 12/21/2012 165  150 - 400 K/uL Final  Appointment on 12/11/2012  Component Date Value Range Status  . WBC 12/11/2012 0.5* 4.0 - 10.3 10e3/uL Final  . NEUT# 12/11/2012 0.2* 1.5 - 6.5 10e3/uL Final  . HGB 12/11/2012 10.0* 13.0 - 17.1 g/dL Final  . HCT 14/78/2956 29.1* 38.4 - 49.9 % Final  . Platelets 12/11/2012 23* 140 - 400 10e3/uL Final  . MCV 12/11/2012 79.5  79.3 - 98.0 fL Final  . MCH 12/11/2012 27.3  27.2 - 33.4 pg Final  . MCHC 12/11/2012 34.4  32.0 - 36.0 g/dL Final  . RBC 21/30/8657 3.66* 4.20 - 5.82 10e6/uL Final  . RDW 12/11/2012 12.4  11.0 - 14.6 % Final  . lymph# 12/11/2012 0.3* 0.9 - 3.3 10e3/uL Final  . MONO# 12/11/2012 0.0* 0.1 - 0.9 10e3/uL Final  . Eosinophils Absolute 12/11/2012 0.0  0.0 - 0.5 10e3/uL Final  . Basophils Absolute 12/11/2012 0.0  0.0 - 0.1 10e3/uL Final  . NEUT% 12/11/2012 33.3* 39.0 - 75.0 % Final  . LYMPH% 12/11/2012 64.7* 14.0 - 49.0 % Final  . MONO% 12/11/2012 2.0  0.0 - 14.0 % Final  . EOS% 12/11/2012 0.0  0.0 - 7.0 % Final  . BASO% 12/11/2012 0.0  0.0 - 2.0 % Final  . Sodium 12/11/2012 139  136 - 145 mEq/L Final  . Potassium 12/11/2012 3.6  3.5 - 5.1 mEq/L Final  . Chloride 12/11/2012 100  98 - 109 mEq/L Final  . CO2 12/11/2012 27  22 - 29 mEq/L Final  . Glucose 12/11/2012 107  70 - 140 mg/dl Final  . BUN 84/69/6295 27.5* 7.0 - 26.0 mg/dL Final  . Creatinine 28/41/3244 1.4* 0.7 - 1.3 mg/dL Final  . Total Bilirubin 12/11/2012 0.80  0.20 - 1.20 mg/dL Final  . Alkaline Phosphatase 12/11/2012 140  40 - 150 U/L Final  . AST 12/11/2012 17  5 - 34 U/L Final  . ALT 12/11/2012 12  0 - 55 U/L Final  . Total Protein 12/11/2012 7.2  6.4 - 8.3 g/dL Final  .  Albumin 12/11/2012 3.4* 3.5 - 5.0  g/dL Final  . Calcium 16/11/9602 9.5  8.4 - 10.4 mg/dL Final  . Anion Gap 54/10/8117 12* 3 - 11 mEq/L Final  Appointment on 12/04/2012  Component Date Value Range Status  . WBC 12/04/2012 3.1* 4.0 - 10.3 10e3/uL Final  . NEUT# 12/04/2012 2.4  1.5 - 6.5 10e3/uL Final  . HGB 12/04/2012 11.9* 13.0 - 17.1 g/dL Final  . HCT 14/78/2956 34.5* 38.4 - 49.9 % Final  . Platelets 12/04/2012 137* 140 - 400 10e3/uL Final  . MCV 12/04/2012 79.7  79.3 - 98.0 fL Final  . MCH 12/04/2012 27.5  27.2 - 33.4 pg Final  . MCHC 12/04/2012 34.5  32.0 - 36.0 g/dL Final  . RBC 21/30/8657 4.33  4.20 - 5.82 10e6/uL Final  . RDW 12/04/2012 12.8  11.0 - 14.6 % Final  . lymph# 12/04/2012 0.6* 0.9 - 3.3 10e3/uL Final  . MONO# 12/04/2012 0.1  0.1 - 0.9 10e3/uL Final  . Eosinophils Absolute 12/04/2012 0.0  0.0 - 0.5 10e3/uL Final  . Basophils Absolute 12/04/2012 0.0  0.0 - 0.1 10e3/uL Final  . NEUT% 12/04/2012 77.2* 39.0 - 75.0 % Final  . LYMPH% 12/04/2012 20.2  14.0 - 49.0 % Final  . MONO% 12/04/2012 1.6  0.0 - 14.0 % Final  . EOS% 12/04/2012 1.0  0.0 - 7.0 % Final  . BASO% 12/04/2012 0.0  0.0 - 2.0 % Final  . nRBC 12/04/2012 0  0 - 0 % Final  . Sodium 12/04/2012 136  136 - 145 mEq/L Final  . Potassium 12/04/2012 3.3* 3.5 - 5.1 mEq/L Final  . Chloride 12/04/2012 101  98 - 109 mEq/L Final  . CO2 12/04/2012 26  22 - 29 mEq/L Final  . Glucose 12/04/2012 126  70 - 140 mg/dl Final  . BUN 84/69/6295 35.3* 7.0 - 26.0 mg/dL Final  . Creatinine 28/41/3244 1.3  0.7 - 1.3 mg/dL Final  . Total Bilirubin 12/04/2012 0.64  0.20 - 1.20 mg/dL Final  . Alkaline Phosphatase 12/04/2012 119  40 - 150 U/L Final  . AST 12/04/2012 18  5 - 34 U/L Final  . ALT 12/04/2012 10  0 - 55 U/L Final  . Total Protein 12/04/2012 6.5  6.4 - 8.3 g/dL Final  . Albumin 03/01/7251 3.2* 3.5 - 5.0 g/dL Final  . Calcium 66/44/0347 8.5  8.4 - 10.4 mg/dL Final  . Anion Gap 42/59/5638 9  3 - 11 mEq/L Final  Appointment on 11/27/2012  Component Date Value  Range Status  . WBC 11/27/2012 7.0  4.0 - 10.3 10e3/uL Final  . NEUT# 11/27/2012 5.1  1.5 - 6.5 10e3/uL Final  . HGB 11/27/2012 12.0* 13.0 - 17.1 g/dL Final  . HCT 75/64/3329 36.2* 38.4 - 49.9 % Final  . Platelets 11/27/2012 274  140 - 400 10e3/uL Final  . MCV 11/27/2012 83.4  79.3 - 98.0 fL Final  . MCH 11/27/2012 27.6  27.2 - 33.4 pg Final  . MCHC 11/27/2012 33.1  32.0 - 36.0 g/dL Final  . RBC 51/88/4166 4.34  4.20 - 5.82 10e6/uL Final  . RDW 11/27/2012 13.4  11.0 - 14.6 % Final  . lymph# 11/27/2012 0.8* 0.9 - 3.3 10e3/uL Final  . MONO# 11/27/2012 0.8  0.1 - 0.9 10e3/uL Final  . Eosinophils Absolute 11/27/2012 0.3  0.0 - 0.5 10e3/uL Final  . Basophils Absolute 11/27/2012 0.0  0.0 - 0.1 10e3/uL Final  . NEUT% 11/27/2012 73.5  39.0 - 75.0 % Final  .  LYMPH% 11/27/2012 11.7* 14.0 - 49.0 % Final  . MONO% 11/27/2012 10.7  0.0 - 14.0 % Final  . EOS% 11/27/2012 4.0  0.0 - 7.0 % Final  . BASO% 11/27/2012 0.1  0.0 - 2.0 % Final  . nRBC 11/27/2012 0  0 - 0 % Final  . Sodium 11/27/2012 140  136 - 145 mEq/L Final  . Potassium 11/27/2012 4.1  3.5 - 5.1 mEq/L Final  . Chloride 11/27/2012 103  98 - 109 mEq/L Final  . CO2 11/27/2012 28  22 - 29 mEq/L Final  . Glucose 11/27/2012 110  70 - 140 mg/dl Final  . BUN 57/84/6962 23.6  7.0 - 26.0 mg/dL Final  . Creatinine 95/28/4132 1.2  0.7 - 1.3 mg/dL Final  . Total Bilirubin 11/27/2012 0.46  0.20 - 1.20 mg/dL Final  . Alkaline Phosphatase 11/27/2012 158* 40 - 150 U/L Final  . AST 11/27/2012 13  5 - 34 U/L Final  . ALT 11/27/2012 7  0 - 55 U/L Final  . Total Protein 11/27/2012 7.5  6.4 - 8.3 g/dL Final  . Albumin 44/02/270 3.5  3.5 - 5.0 g/dL Final  . Calcium 53/66/4403 9.7  8.4 - 10.4 mg/dL Final  Hospital Outpatient Visit on 11/14/2012  Component Date Value Range Status  . Glucose-Capillary 11/14/2012 91  70 - 99 mg/dL Final  Appointment on 47/42/5956  Component Date Value Range Status  . Sodium 11/01/2012 142  136 - 145 mEq/L Final  .  Potassium 11/01/2012 4.5  3.5 - 5.1 mEq/L Final  . Chloride 11/01/2012 105  98 - 109 mEq/L Final  . CO2 11/01/2012 27  22 - 29 mEq/L Final  . Glucose 11/01/2012 102  70 - 140 mg/dl Final  . BUN 38/75/6433 20.5  7.0 - 26.0 mg/dL Final  . Creatinine 29/51/8841 1.3  0.7 - 1.3 mg/dL Final  . Total Bilirubin 11/01/2012 0.48  0.20 - 1.20 mg/dL Final  . Alkaline Phosphatase 11/01/2012 146  40 - 150 U/L Final  . AST 11/01/2012 14  5 - 34 U/L Final  . ALT 11/01/2012 10  0 - 55 U/L Final  . Total Protein 11/01/2012 7.3  6.4 - 8.3 g/dL Final  . Albumin 66/07/3014 3.5  3.5 - 5.0 g/dL Final  . Calcium 03/07/3233 9.7  8.4 - 10.4 mg/dL Final  . WBC 57/32/2025 6.8  4.0 - 10.3 10e3/uL Final  . NEUT# 11/01/2012 5.1  1.5 - 6.5 10e3/uL Final  . HGB 11/01/2012 12.5* 13.0 - 17.1 g/dL Final  . HCT 42/70/6237 37.2* 38.4 - 49.9 % Final  . Platelets 11/01/2012 309  140 - 400 10e3/uL Final  . MCV 11/01/2012 84.3  79.3 - 98.0 fL Final  . MCH 11/01/2012 28.3  27.2 - 33.4 pg Final  . MCHC 11/01/2012 33.6  32.0 - 36.0 g/dL Final  . RBC 62/83/1517 4.41  4.20 - 5.82 10e6/uL Final  . RDW 11/01/2012 13.9  11.0 - 14.6 % Final  . lymph# 11/01/2012 0.7* 0.9 - 3.3 10e3/uL Final  . MONO# 11/01/2012 0.7  0.1 - 0.9 10e3/uL Final  . Eosinophils Absolute 11/01/2012 0.3  0.0 - 0.5 10e3/uL Final  . Basophils Absolute 11/01/2012 0.0  0.0 - 0.1 10e3/uL Final  . NEUT% 11/01/2012 74.4  39.0 - 75.0 % Final  . LYMPH% 11/01/2012 10.7* 14.0 - 49.0 % Final  . MONO% 11/01/2012 10.0  0.0 - 14.0 % Final  . EOS% 11/01/2012 4.6  0.0 - 7.0 % Final  . BASO% 11/01/2012 0.3  0.0 - 2.0 % Final         Assessment & Plan:  Non-small cell lung cancer, right: pain in the right lower lung and right upper abd related to the cancer. Recommend radiotherapy.  Abdominal pain, chronic, right upper quadrant: related to the lung cancer  Anorexia - Plan: mirtazapine (REMERON) 30 MG tablet  Protein-calorie malnutrition, severe  Hypertension:  moderate elevation. No change in medication  Adult failure to thrive: related to lung cancer and anorexia

## 2013-01-15 NOTE — Patient Instructions (Addendum)
Continue current medications.  Try to stretch intervals between doses of prochlorperazine to minmize drowsiness. Call Dr. Kathrynn Running to schedule follow up.

## 2013-01-17 ENCOUNTER — Telehealth: Payer: Self-pay

## 2013-01-17 NOTE — Telephone Encounter (Signed)
Per Shanda Bumps referral to home health for PT to evaluate the need for walker if family agrees.

## 2013-01-17 NOTE — Telephone Encounter (Signed)
Patient's wife was calling to reconsider Dr.Green's recommendation for a walker. Patient's wife is not sure what type of walker would be best for patient ie- wheels vs no wheels, seat or any other accommodations. Patient's wife would like to pick-up rx for walker when available, Mrs.Ahlin is aware that Dr.Green is out of the office and I will have to send to covering provider   Please advise

## 2013-01-17 NOTE — Telephone Encounter (Signed)
Spoke with patient, his wife was asleep. Patient's husband will have patient call when she wakes up

## 2013-01-17 NOTE — Telephone Encounter (Signed)
Message was originally sent to Wyatt Portela, NP. Eunice Blase is out of office, I will send to covering provider

## 2013-01-21 NOTE — Telephone Encounter (Signed)
Spoke with patient's wife, patient will call back next week when Dr.Green returns. Patient refused PT at this time

## 2013-01-21 NOTE — Telephone Encounter (Signed)
Left message on voicemail informing patient's wife to return call when available

## 2013-01-22 ENCOUNTER — Ambulatory Visit: Payer: Medicare Other | Admitting: Radiation Oncology

## 2013-01-28 ENCOUNTER — Other Ambulatory Visit: Payer: Medicare Other | Admitting: Lab

## 2013-01-28 ENCOUNTER — Ambulatory Visit: Payer: Medicare Other | Admitting: Internal Medicine

## 2013-01-29 ENCOUNTER — Telehealth: Payer: Self-pay | Admitting: *Deleted

## 2013-01-29 NOTE — Telephone Encounter (Signed)
Patient wife, Justin Pittman, called and stated that her husband is not doing well and is sleeping a lot and not eating much. [facility] wants him to do more radiation. Wife feels like she is not doing enough for him. She would like to talk with you for advice. Please call # [phone]

## 2013-01-30 ENCOUNTER — Ambulatory Visit: Payer: Medicare Other | Admitting: Internal Medicine

## 2013-01-30 NOTE — Telephone Encounter (Signed)
Discussed his current condition. If he continues to do poorly with intake and vomiting, she may need to take him to the hospital for evaluation and admission.

## 2013-01-31 ENCOUNTER — Encounter: Payer: Self-pay | Admitting: Radiation Oncology

## 2013-01-31 NOTE — Progress Notes (Unsigned)
  Radiation Oncology         [phone] ________________________________  Name: Damonie Ellenwood MRN: [id number]  Date: 01/31/2013  DOB: 24-Dec-1932  Telephone contact:  I received a call regarding this patient from his wife.  He is not feeling well enough to consider radiation.  He has had some nausea.  I advised the patient that if symptoms worsen to call back or consider proceeding to the emergency room for further evaluation.  ________________________________  Artist Pais Kathrynn Running, M.D.

## 2013-02-06 ENCOUNTER — Telehealth: Payer: Self-pay | Admitting: Medical Oncology

## 2013-02-06 ENCOUNTER — Other Ambulatory Visit: Payer: Self-pay | Admitting: *Deleted

## 2013-02-06 MED ORDER — HYDROCODONE-ACETAMINOPHEN 10-325 MG PO TABS: 1.0000 | ORAL_TABLET | Freq: Four times a day (QID) | ORAL | Status: AC | PRN

## 2013-02-06 NOTE — Telephone Encounter (Signed)
Justin Pittman called yesterday to report that pt wife called and wanted hospice services. Per Dr Arbutus Ped I called in Hospice referral.

## 2013-02-10 ENCOUNTER — Telehealth: Payer: Self-pay | Admitting: *Deleted

## 2013-02-10 NOTE — Telephone Encounter (Signed)
Received call from hospice.  Pt and his wife want to hold off on hospice for now because they may want to consider more treatment options.  Will forward to Dr Donnald Garre.  SLJ

## 2013-02-11 ENCOUNTER — Telehealth: Payer: Self-pay | Admitting: Dietician

## 2013-02-11 NOTE — Telephone Encounter (Signed)
Brief Outpatient Oncology Nutrition Note  Patient has been identified to be at risk on malnutrition screen.  Wt Readings from Last 10 Encounters:  01/15/13 131 lb 9.6 oz (59.693 kg)  12/31/12 131 lb 6.4 oz (59.603 kg)  12/24/12 133 lb (60.328 kg)  12/18/12 138 lb 9.6 oz (62.869 kg)  12/04/12 138 lb 6.4 oz (62.778 kg)  11/19/12 146 lb 1.6 oz (66.271 kg)  11/05/12 147 lb 4.8 oz (66.815 kg)  10/29/12 147 lb (66.679 kg)  10/22/12 147 lb (66.679 kg)  09/18/12 148 lb 9.6 oz (67.405 kg)    Dx:  Small cell lung cancer.  Severe Protein-Calorie Malnutrition  Called and spoke with patient's wife secondary to continued weight loss.  Noted RN call to home yesterday and family refused hospice at this time desiring option for further treatment.    At this time, wife reports that patient is sleeping a lot secondary to meds and not eating much.  Patient is not drinking Ensure much.  Encouraged wife and gave tips to increase calories and protein, recommended small frequent meals and 2-3 Ensure daily to increase strength.  Encouraged wife to call if she needs more support.  Oran Rein, RD, LDN

## 2013-02-24 ENCOUNTER — Telehealth: Payer: Self-pay | Admitting: *Deleted

## 2013-02-24 DIAGNOSIS — C3490 Malignant neoplasm of unspecified part of unspecified bronchus or lung: Secondary | ICD-10-CM

## 2013-02-24 MED ORDER — ONDANSETRON 8 MG PO TBDP: 8.0000 mg | ORAL_TABLET | Freq: Three times a day (TID) | ORAL | Status: AC | PRN

## 2013-02-24 NOTE — Telephone Encounter (Signed)
Verbal order received and read back from Dr. Arbutus Ped for patient to have zofran 8 mg ODT and if no relief with this to contact his PCP.   Called wife with this order.  Reports he was able to take the compazine.  "He is settling down now that he's taken the compazine.  Will send this order in to have on hand if needed.  No further questions.  Reports "if he needs to go to ED or an Urgent Care we'll have to pick him up and carry him because he can't walk, he's too weak".

## 2013-02-24 NOTE — Telephone Encounter (Signed)
Wife called reporting Justin Pittman is vomiting as of last night and today.  "He's not vomiting more dry heaves because he's not eating."  Reports he hadn't been in a couple days until last night he started having bm's.  Wife reports he eats two spoonfulls and drinks one ensure on occasion.  Drinks maybe one water bottle per day.  "Bowels last night and today have been balls, hard, liquid and normal soft." Encouraged to try the compazine he still has on hand from Carbo/Gemzar chemo this October, try to increase fluid intake.  Will notify providers and call at [phone] with any further instructions.    Asked about missed appointments and to reschedule.  Reports they are "no longer at [facility] and [name] is bed ridden except for the two steps he takes to get to the bathroom.  He's too sick to come see a doctor and can't take RT".

## 2013-02-26 ENCOUNTER — Telehealth: Payer: Self-pay | Admitting: *Deleted

## 2013-02-26 NOTE — Telephone Encounter (Signed)
Evette called from hospice stating pt's wife has called hospice back in to start pt on hospice again.  SLJ

## 2013-03-07 ENCOUNTER — Telehealth: Payer: Self-pay | Admitting: *Deleted

## 2013-03-07 NOTE — Telephone Encounter (Signed)
Spoke to pt's wife, Marcie Bal, regarding Hospice wanting to have him stop the Hydrocodone and start taking Morphine due to pain level.  She is c/o due to last time he was given Morphine in the hospital he became septic.  They value your opinion and would like to know how you feel about this.  Please advise. thanks

## 2013-03-07 NOTE — Telephone Encounter (Signed)
I think the morphine is a good choice. I doubt it had anything to do with the sepsis.

## 2013-03-10 NOTE — Telephone Encounter (Signed)
Pt's wife Marcie Bal notified VIA of recommendations and is okay with decision.

## 2013-03-19 ENCOUNTER — Other Ambulatory Visit: Payer: Self-pay | Admitting: Internal Medicine

## 2013-04-29 ENCOUNTER — Telehealth: Payer: Self-pay | Admitting: Medical Oncology

## 2013-04-29 NOTE — Telephone Encounter (Signed)
I spoke to wife who is concerned about Justin Pittman not having a doctor followup and asking if a  doctor will see him. He is  not eating or drinking due to risk of aspiration. "this is not good if he is not drinking fluids" . She is also concerned that the morphine will lower his oxygen . I told her it may  and that it should help with Justin Pittman's  shortness of breath or air hunger.  I told her that hospice collaborates with Dr Julien Nordmann to have standing orders in place to keep Justin Pittman comfortable so that he can remain at home . Hospice RN stated she is going to give him some atropine to dry up secretions. Okay per Dr Julien Nordmann.

## 2013-04-29 NOTE — Telephone Encounter (Signed)
[name] called and said Justin Pittman is not doing well. She would like for him to see a doctor.I left a message for Yvette at [facility] to call me.

## 2013-05-28 DEATH — deceased

## 2013-10-16 IMAGING — CT CT CHEST W/ CM
2 of 3 series · 15 of 36 positions shown, 18 images · IV contrast (OMNIPAQUE)
Comparison: Plain film of 12/17/2011.  Most recent CT chest
08/22/2011.  PET of 09/05/2011.

CLINICAL DATA: Lung cancer diagnosed in [REDACTED].  Chemotherapy
complete 12/05/2011.  Radiation therapy complete [DATE].  History
radiation esophagitis.  Pancytopenia.  Anemia.  Aspiration
pneumonia.  Cough and weight loss.

CT CHEST WITH CONTRAST
TECHNIQUE: Multidetector CT imaging of the chest was performed
following the standard protocol during bolus administration of
intravenous contrast.
Contrast: 80mL OMNIPAQUE IOHEXOL 300 MG/ML  SOLN

[Series 2: chest with st · axial · 0.76mm/px · z∈[-374,-94]mm · 12 of 66 slices shown, 15 images]
[im 5/66  mediastinal]
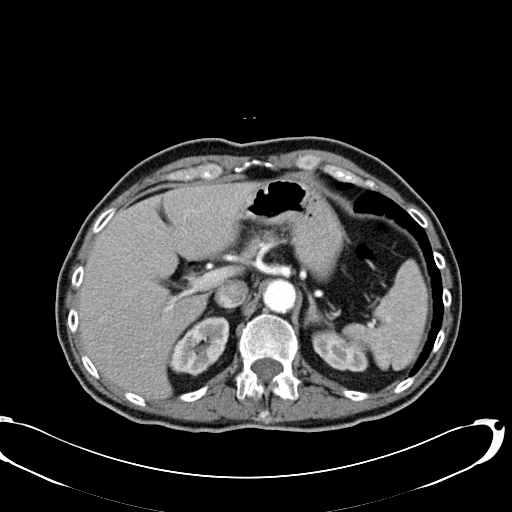
[im 5/66  lung]
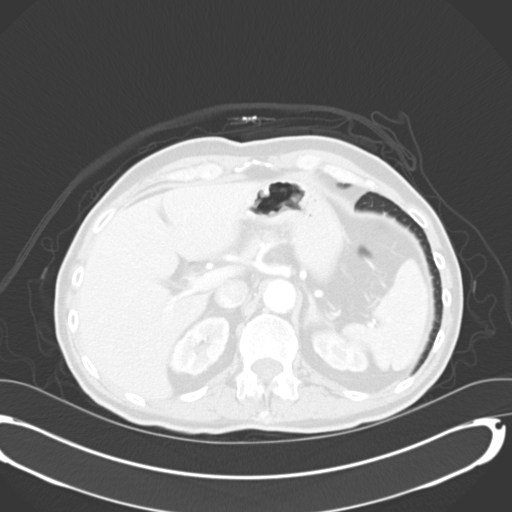
[im 10/66  lung]
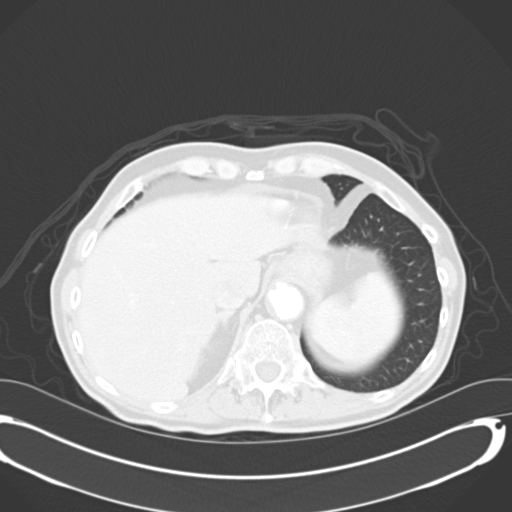
[im 15/66  lung]
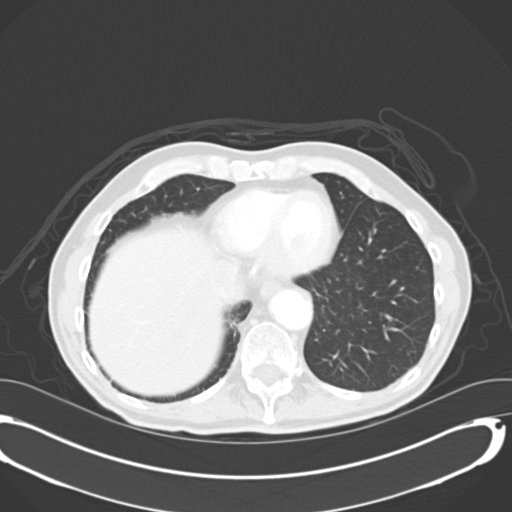
[im 20/66  lung]
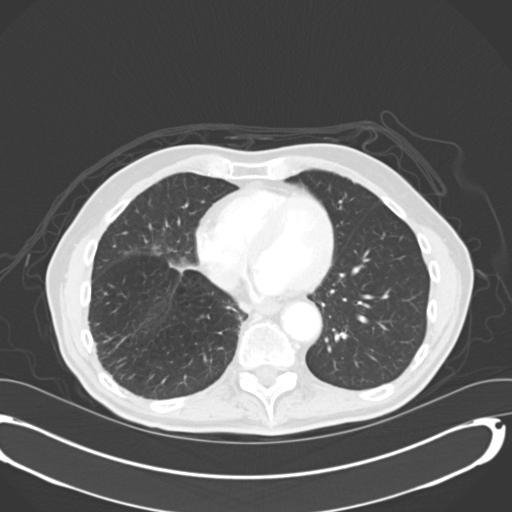
[im 25/66  mediastinal]
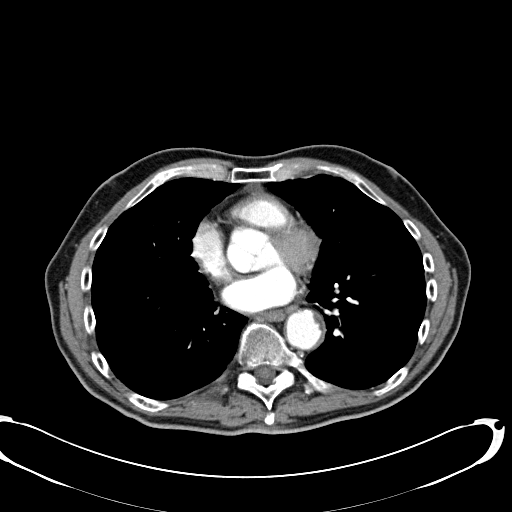
[im 25/66  lung]
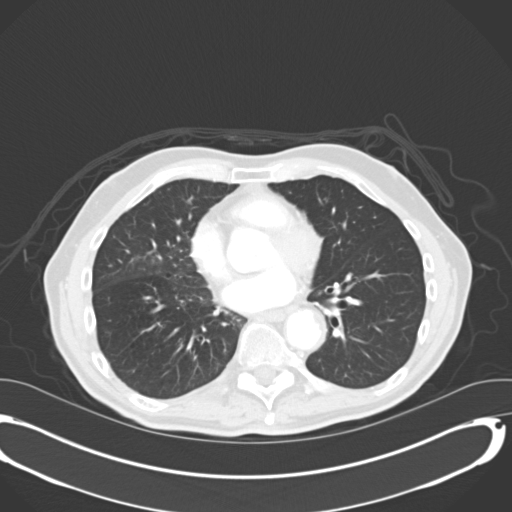
[im 29/66  lung]
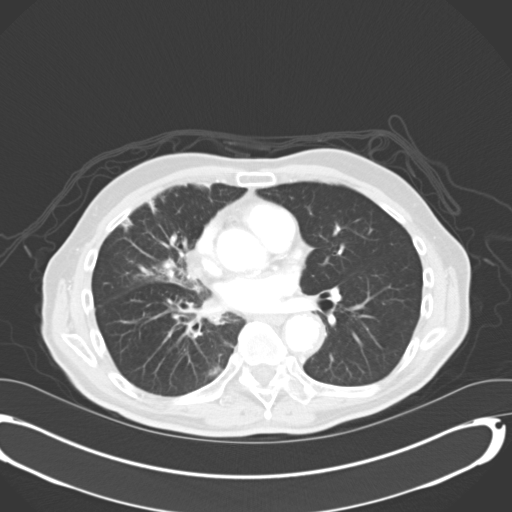
[im 37/66  lung]
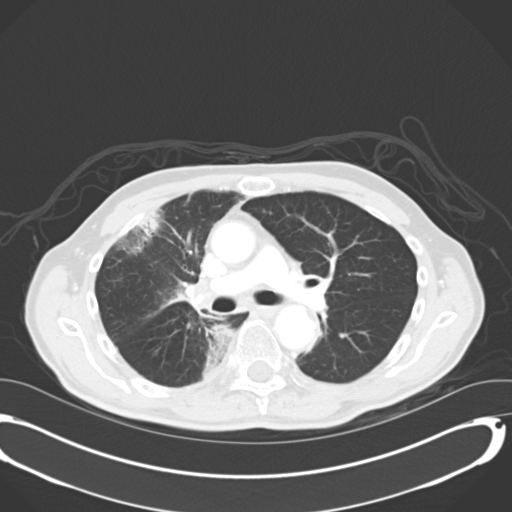
[im 41/66  lung]
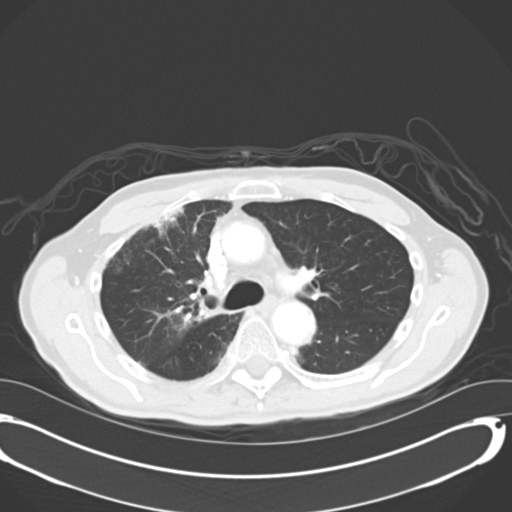
[im 46/66  mediastinal]
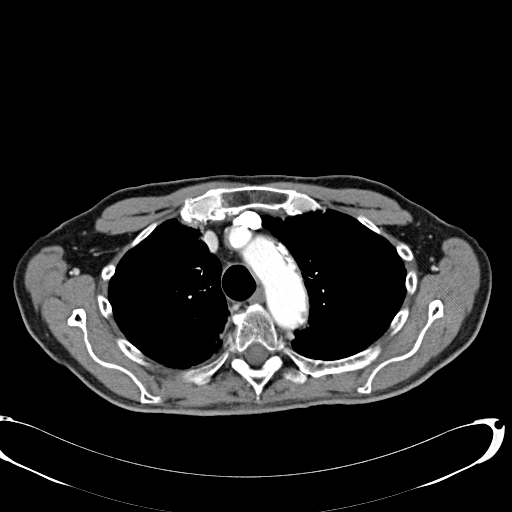
[im 46/66  lung]
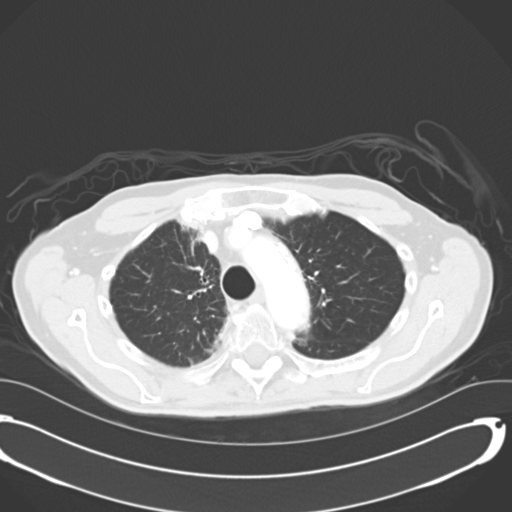
[im 51/66  lung]
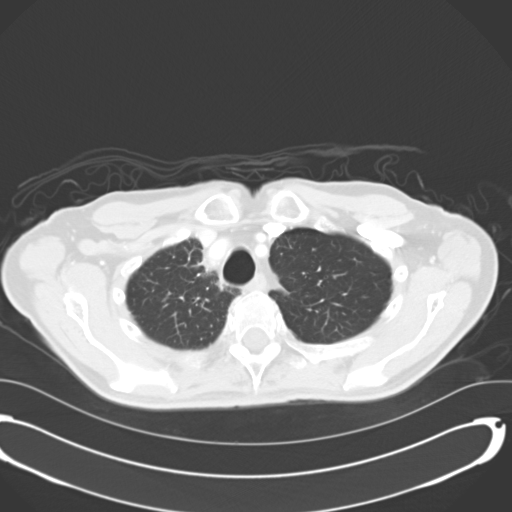
[im 56/66  lung]
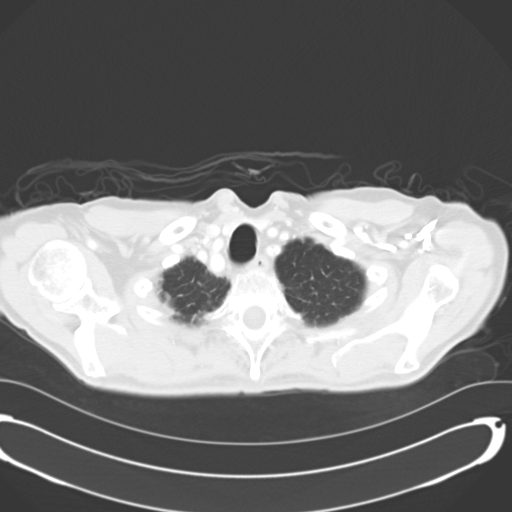
[im 61/66  lung]
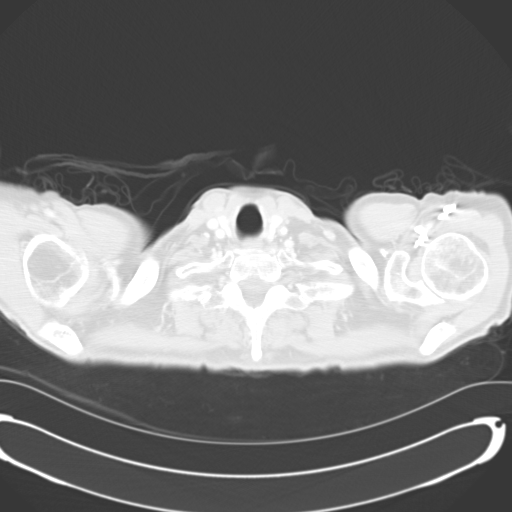

[Series 602: <mpr thick range> · coronal · 0.76mm/px · 3 of 74 slices shown]
[im 15/74  lung]
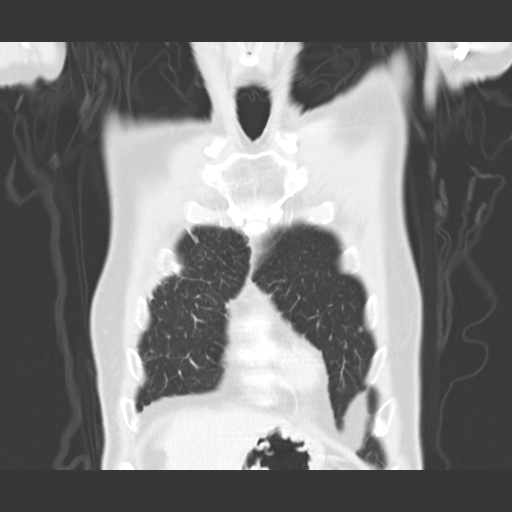
[im 30/74  lung]
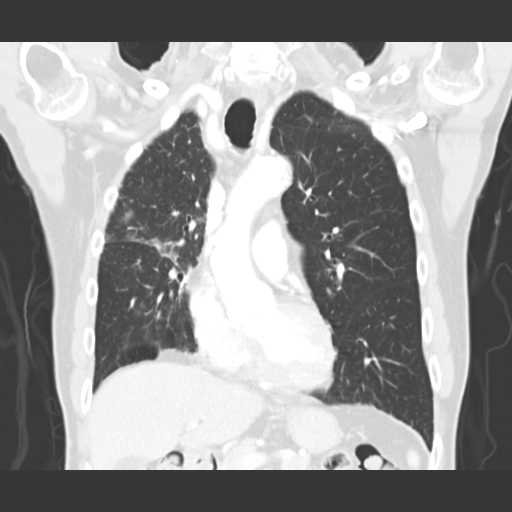
[im 44/74  lung]
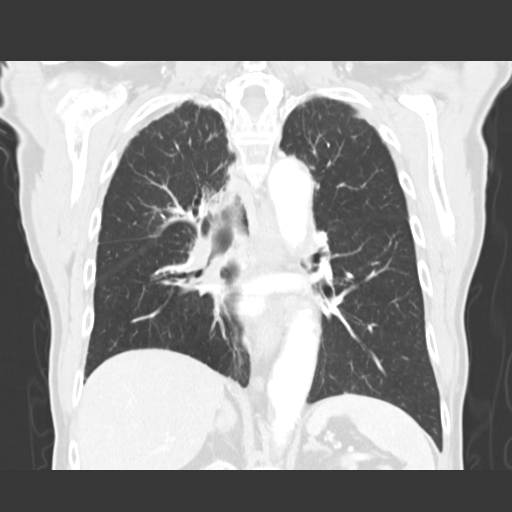

[15 of 36 positions shown; findings below may reference images not displayed]

FINDINGS: Lungs/pleura: Persistent mass effect upon the right lower
lobe segmental bronchi, which appeared patent distally.

Biapical pleural parenchymal scarring.  Multifocal radiation change
within the medial right lung.

Response to therapy of medial right lower lobe primary.  2.5 x
cm spiculated nodule remains on image 35/series 5.  This is
decreased from 3.7 x 3.8 cm on the prior exam.  There is air within
the superior posterior portion of this lesion on image 33/series 5.
This is presumably related to necrosis.

Minimal nodularity more cephalad within the right lower lobe on
image 27/series 5 which is new.

No pleural fluid.

Heart/Mediastinum: No supraclavicular adenopathy.  Focal dissection
involving the left subclavian artery coronal image 29/series 602.
This is unchanged.

Extensive ulcerative plaque within the descending thoracic aorta.
Normal heart size with minimal anterior pericardial fluid or
thickening. No central pulmonary embolism, on this non-dedicated
study.

Precarinal node measures 8 mm today versus 1.0 cm on the prior.
Soft tissue density/nodal tissue within the right hilum is
immediately cephalad the central nodule and upper normal in size.
Similar to on the prior. 1.3 cm.

No left hilar adenopathy.

Upper abdomen: Gastrostomy tube.  Bilateral renal atrophy.  Normal
imaged portions of adrenal glands.

Bones/Musculoskeletal:  Mild osteopenia. No acute osseous
abnormality.
IMPRESSION: 1.  Response to therapy of central right lower lobe lung nodule.
2.  Presumed radiation changes throughout the medial right lung.
3.  Similar and decreased thoracic nodal size.  No new or
progressive disease identified.
4.  Advanced coronary artery disease.  Focal dissection in the left
subclavian artery which is similar to on the prior.
5.  Minimal superior segment right lower lobe nodularity which is
nonspecific and warrants followup attention.

## 2013-10-24 ENCOUNTER — Other Ambulatory Visit: Payer: Self-pay | Admitting: *Deleted
